# Patient Record
Sex: Male | Born: 1950
Health system: Southern US, Community
[De-identification: ages and names within clinical notes are randomized; demographics above are authoritative.]

## PROBLEM LIST (undated history)

## (undated) DIAGNOSIS — I10 Essential (primary) hypertension: Secondary | ICD-10-CM

## (undated) DIAGNOSIS — I4821 Permanent atrial fibrillation: Secondary | ICD-10-CM

## (undated) DIAGNOSIS — E785 Hyperlipidemia, unspecified: Secondary | ICD-10-CM

## (undated) DIAGNOSIS — T7840XA Allergy, unspecified, initial encounter: Secondary | ICD-10-CM

## (undated) DIAGNOSIS — E119 Type 2 diabetes mellitus without complications: Secondary | ICD-10-CM

## (undated) DIAGNOSIS — K4090 Unilateral inguinal hernia, without obstruction or gangrene, not specified as recurrent: Secondary | ICD-10-CM

## (undated) DIAGNOSIS — I2510 Atherosclerotic heart disease of native coronary artery without angina pectoris: Secondary | ICD-10-CM

## (undated) DIAGNOSIS — R51 Headache: Secondary | ICD-10-CM

## (undated) DIAGNOSIS — I480 Paroxysmal atrial fibrillation: Secondary | ICD-10-CM

## (undated) DIAGNOSIS — I5189 Other ill-defined heart diseases: Secondary | ICD-10-CM

## (undated) HISTORY — DX: Other ill-defined heart diseases: I51.89

## (undated) HISTORY — DX: Hyperlipidemia, unspecified: E78.5

## (undated) HISTORY — DX: Allergy, unspecified, initial encounter: T78.40XA

## (undated) HISTORY — DX: Type 2 diabetes mellitus without complications: E11.9

## (undated) HISTORY — DX: Paroxysmal atrial fibrillation: I48.0

## (undated) HISTORY — DX: Essential (primary) hypertension: I10

## (undated) HISTORY — DX: Permanent atrial fibrillation: I48.21

## (undated) HISTORY — PX: INGUINAL HERNIA REPAIR: SUR1180

---

## 2000-09-29 ENCOUNTER — Encounter: Payer: Self-pay | Admitting: Family Medicine

## 2000-09-29 LAB — CONVERTED CEMR LAB: Hgb A1c MFr Bld: 5.6 %

## 2001-09-28 ENCOUNTER — Encounter: Payer: Self-pay | Admitting: Family Medicine

## 2001-09-28 LAB — CONVERTED CEMR LAB
Hgb A1c MFr Bld: 5.4 %
PSA: 1.1 ng/mL

## 2002-12-24 ENCOUNTER — Encounter: Payer: Self-pay | Admitting: Family Medicine

## 2002-12-24 LAB — CONVERTED CEMR LAB
Hgb A1c MFr Bld: 5.5 %
PSA: 0.9 ng/mL

## 2004-03-05 ENCOUNTER — Encounter: Payer: Self-pay | Admitting: Family Medicine

## 2004-03-05 LAB — CONVERTED CEMR LAB
Hgb A1c MFr Bld: 5.8 %
PSA: 1.4 ng/mL

## 2004-08-13 ENCOUNTER — Ambulatory Visit: Payer: Self-pay | Admitting: Family Medicine

## 2004-08-13 LAB — CONVERTED CEMR LAB: Hgb A1c MFr Bld: 5.5 %

## 2004-08-17 ENCOUNTER — Ambulatory Visit: Payer: Self-pay | Admitting: Family Medicine

## 2004-12-14 ENCOUNTER — Ambulatory Visit: Payer: Self-pay | Admitting: Family Medicine

## 2005-02-12 ENCOUNTER — Ambulatory Visit: Payer: Self-pay | Admitting: Family Medicine

## 2005-02-12 LAB — CONVERTED CEMR LAB
Hgb A1c MFr Bld: 5.8 %
PSA: 1.21 ng/mL

## 2005-02-17 ENCOUNTER — Ambulatory Visit: Payer: Self-pay | Admitting: Family Medicine

## 2005-04-07 ENCOUNTER — Ambulatory Visit: Payer: Self-pay | Admitting: Family Medicine

## 2005-08-17 ENCOUNTER — Ambulatory Visit: Payer: Self-pay | Admitting: Family Medicine

## 2006-03-18 ENCOUNTER — Ambulatory Visit: Payer: Self-pay | Admitting: Family Medicine

## 2006-03-18 LAB — CONVERTED CEMR LAB: PSA: 1.22 ng/mL

## 2006-05-02 ENCOUNTER — Ambulatory Visit: Payer: Self-pay | Admitting: Family Medicine

## 2006-05-02 LAB — CONVERTED CEMR LAB: Hgb A1c MFr Bld: 6.1 %

## 2006-05-13 ENCOUNTER — Ambulatory Visit: Payer: Self-pay | Admitting: Family Medicine

## 2006-06-21 ENCOUNTER — Ambulatory Visit: Payer: Self-pay | Admitting: Family Medicine

## 2006-07-14 ENCOUNTER — Ambulatory Visit: Payer: Self-pay | Admitting: Family Medicine

## 2006-09-19 ENCOUNTER — Ambulatory Visit: Payer: Self-pay | Admitting: Family Medicine

## 2006-09-19 ENCOUNTER — Encounter: Admission: RE | Admit: 2006-09-19 | Discharge: 2006-09-19 | Payer: Self-pay | Admitting: Family Medicine

## 2006-09-27 ENCOUNTER — Encounter: Payer: Self-pay | Admitting: Family Medicine

## 2006-09-27 DIAGNOSIS — E785 Hyperlipidemia, unspecified: Secondary | ICD-10-CM

## 2006-09-27 DIAGNOSIS — I10 Essential (primary) hypertension: Secondary | ICD-10-CM

## 2006-09-27 DIAGNOSIS — N183 Chronic kidney disease, stage 3 unspecified: Secondary | ICD-10-CM | POA: Insufficient documentation

## 2006-09-27 DIAGNOSIS — E1169 Type 2 diabetes mellitus with other specified complication: Secondary | ICD-10-CM | POA: Insufficient documentation

## 2006-09-27 DIAGNOSIS — J309 Allergic rhinitis, unspecified: Secondary | ICD-10-CM | POA: Insufficient documentation

## 2006-09-27 DIAGNOSIS — I129 Hypertensive chronic kidney disease with stage 1 through stage 4 chronic kidney disease, or unspecified chronic kidney disease: Secondary | ICD-10-CM | POA: Insufficient documentation

## 2006-10-13 ENCOUNTER — Ambulatory Visit: Payer: Self-pay | Admitting: Family Medicine

## 2006-12-05 ENCOUNTER — Ambulatory Visit: Payer: Self-pay | Admitting: Family Medicine

## 2007-03-06 ENCOUNTER — Ambulatory Visit: Payer: Self-pay | Admitting: Family Medicine

## 2007-04-10 ENCOUNTER — Ambulatory Visit: Payer: Self-pay | Admitting: Family Medicine

## 2007-04-13 ENCOUNTER — Ambulatory Visit: Payer: Self-pay | Admitting: Family Medicine

## 2007-10-25 ENCOUNTER — Ambulatory Visit: Payer: Self-pay | Admitting: Family Medicine

## 2008-05-23 ENCOUNTER — Encounter: Admission: RE | Admit: 2008-05-23 | Discharge: 2008-05-23 | Payer: Self-pay | Admitting: Internal Medicine

## 2008-07-15 LAB — HM COLONOSCOPY

## 2009-10-15 ENCOUNTER — Encounter: Admission: RE | Admit: 2009-10-15 | Discharge: 2009-10-15 | Payer: Self-pay | Admitting: Internal Medicine

## 2011-06-15 DIAGNOSIS — I2510 Atherosclerotic heart disease of native coronary artery without angina pectoris: Secondary | ICD-10-CM

## 2011-06-15 HISTORY — DX: Atherosclerotic heart disease of native coronary artery without angina pectoris: I25.10

## 2012-04-26 ENCOUNTER — Encounter (HOSPITAL_COMMUNITY): Payer: Self-pay | Admitting: Emergency Medicine

## 2012-04-26 ENCOUNTER — Emergency Department (HOSPITAL_COMMUNITY): Payer: BC Managed Care – PPO

## 2012-04-26 ENCOUNTER — Inpatient Hospital Stay (HOSPITAL_COMMUNITY)
Admission: EM | Admit: 2012-04-26 | Discharge: 2012-05-04 | DRG: 546 | Disposition: A | Discharge status: Home / Self Care | Payer: BC Managed Care – PPO | Attending: Cardiothoracic Surgery | Admitting: Cardiothoracic Surgery

## 2012-04-26 DIAGNOSIS — I2510 Atherosclerotic heart disease of native coronary artery without angina pectoris: Principal | ICD-10-CM | POA: Diagnosis present

## 2012-04-26 DIAGNOSIS — I5031 Acute diastolic (congestive) heart failure: Secondary | ICD-10-CM | POA: Diagnosis not present

## 2012-04-26 DIAGNOSIS — E119 Type 2 diabetes mellitus without complications: Secondary | ICD-10-CM | POA: Diagnosis present

## 2012-04-26 DIAGNOSIS — I4891 Unspecified atrial fibrillation: Secondary | ICD-10-CM | POA: Diagnosis not present

## 2012-04-26 DIAGNOSIS — R079 Chest pain, unspecified: Secondary | ICD-10-CM

## 2012-04-26 DIAGNOSIS — I509 Heart failure, unspecified: Secondary | ICD-10-CM | POA: Diagnosis not present

## 2012-04-26 DIAGNOSIS — K5900 Constipation, unspecified: Secondary | ICD-10-CM | POA: Diagnosis not present

## 2012-04-26 DIAGNOSIS — I480 Paroxysmal atrial fibrillation: Secondary | ICD-10-CM

## 2012-04-26 DIAGNOSIS — Z951 Presence of aortocoronary bypass graft: Secondary | ICD-10-CM

## 2012-04-26 DIAGNOSIS — I10 Essential (primary) hypertension: Secondary | ICD-10-CM | POA: Diagnosis present

## 2012-04-26 DIAGNOSIS — D62 Acute posthemorrhagic anemia: Secondary | ICD-10-CM | POA: Diagnosis not present

## 2012-04-26 DIAGNOSIS — I200 Unstable angina: Secondary | ICD-10-CM | POA: Diagnosis present

## 2012-04-26 DIAGNOSIS — I252 Old myocardial infarction: Secondary | ICD-10-CM

## 2012-04-26 DIAGNOSIS — T63441A Toxic effect of venom of bees, accidental (unintentional), initial encounter: Secondary | ICD-10-CM

## 2012-04-26 DIAGNOSIS — Z8249 Family history of ischemic heart disease and other diseases of the circulatory system: Secondary | ICD-10-CM

## 2012-04-26 HISTORY — DX: Headache: R51

## 2012-04-26 HISTORY — DX: Atherosclerotic heart disease of native coronary artery without angina pectoris: I25.10

## 2012-04-26 LAB — CBC WITH DIFFERENTIAL/PLATELET
Basophils Absolute: 0 10*3/uL (ref 0.0–0.1)
Basophils Relative: 0 % (ref 0–1)
Eosinophils Absolute: 0.3 10*3/uL (ref 0.0–0.7)
Eosinophils Relative: 4 % (ref 0–5)
HCT: 42.8 % (ref 39.0–52.0)
Hemoglobin: 15.3 g/dL (ref 13.0–17.0)
Lymphocytes Relative: 38 % (ref 12–46)
Lymphs Abs: 3 10*3/uL (ref 0.7–4.0)
MCH: 31.4 pg (ref 26.0–34.0)
MCHC: 35.7 g/dL (ref 30.0–36.0)
MCV: 87.7 fL (ref 78.0–100.0)
Monocytes Absolute: 0.6 10*3/uL (ref 0.1–1.0)
Monocytes Relative: 7 % (ref 3–12)
Neutro Abs: 3.9 10*3/uL (ref 1.7–7.7)
Neutrophils Relative %: 50 % (ref 43–77)
Platelets: 195 10*3/uL (ref 150–400)
RBC: 4.88 MIL/uL (ref 4.22–5.81)
RDW: 12.6 % (ref 11.5–15.5)
WBC: 7.7 10*3/uL (ref 4.0–10.5)

## 2012-04-26 LAB — POCT I-STAT TROPONIN I: Troponin i, poc: 0.05 ng/mL (ref 0.00–0.08)

## 2012-04-26 NOTE — ED Notes (Signed)
PT. REPORTS CHEST PAIN / HEAVINESS , SHOULDER PAIN , HEADACHE FOR SEVERAL DAYS, STATES STUNG BY YELLOW JACKET 2 WEEKS AGO . RESPIRATIONS UNLABORED.

## 2012-04-27 ENCOUNTER — Emergency Department (HOSPITAL_COMMUNITY): Payer: BC Managed Care – PPO

## 2012-04-27 ENCOUNTER — Encounter (HOSPITAL_COMMUNITY)
Admission: EM | Disposition: A | Discharge status: Home / Self Care | Payer: Self-pay | Source: Home / Self Care | Attending: Cardiothoracic Surgery

## 2012-04-27 ENCOUNTER — Other Ambulatory Visit: Payer: Self-pay | Admitting: *Deleted

## 2012-04-27 ENCOUNTER — Encounter (HOSPITAL_COMMUNITY): Payer: Self-pay

## 2012-04-27 ENCOUNTER — Inpatient Hospital Stay (HOSPITAL_COMMUNITY)
Admit: 2012-04-27 | Discharge: 2012-04-27 | Disposition: A | Discharge status: Home / Self Care | Payer: BC Managed Care – PPO | Attending: Cardiothoracic Surgery | Admitting: Cardiothoracic Surgery

## 2012-04-27 ENCOUNTER — Observation Stay (HOSPITAL_COMMUNITY): Payer: BC Managed Care – PPO

## 2012-04-27 DIAGNOSIS — I2510 Atherosclerotic heart disease of native coronary artery without angina pectoris: Secondary | ICD-10-CM

## 2012-04-27 DIAGNOSIS — R079 Chest pain, unspecified: Secondary | ICD-10-CM

## 2012-04-27 DIAGNOSIS — Z01810 Encounter for preprocedural cardiovascular examination: Secondary | ICD-10-CM

## 2012-04-27 HISTORY — PX: LEFT HEART CATHETERIZATION WITH CORONARY ANGIOGRAM: SHX5451

## 2012-04-27 LAB — CBC
HCT: 41.9 % (ref 39.0–52.0)
Hemoglobin: 14.7 g/dL (ref 13.0–17.0)
MCH: 30.5 pg (ref 26.0–34.0)
MCHC: 35.1 g/dL (ref 30.0–36.0)
MCV: 86.9 fL (ref 78.0–100.0)
Platelets: 203 10*3/uL (ref 150–400)
RBC: 4.82 MIL/uL (ref 4.22–5.81)
RDW: 12.7 % (ref 11.5–15.5)
WBC: 18.8 10*3/uL — ABNORMAL HIGH (ref 4.0–10.5)

## 2012-04-27 LAB — URINALYSIS, ROUTINE W REFLEX MICROSCOPIC
Bilirubin Urine: NEGATIVE
Glucose, UA: NEGATIVE mg/dL
Hgb urine dipstick: NEGATIVE
Ketones, ur: NEGATIVE mg/dL
Leukocytes, UA: NEGATIVE
Nitrite: NEGATIVE
Protein, ur: NEGATIVE mg/dL
Specific Gravity, Urine: 1.037 — ABNORMAL HIGH (ref 1.005–1.030)
Urobilinogen, UA: 1 mg/dL (ref 0.0–1.0)
pH: 6.5 (ref 5.0–8.0)

## 2012-04-27 LAB — COMPREHENSIVE METABOLIC PANEL
ALT: 26 U/L (ref 0–53)
AST: 35 U/L (ref 0–37)
Albumin: 4 g/dL (ref 3.5–5.2)
Alkaline Phosphatase: 78 U/L (ref 39–117)
BUN: 18 mg/dL (ref 6–23)
CO2: 31 mEq/L (ref 19–32)
Calcium: 9.6 mg/dL (ref 8.4–10.5)
Chloride: 98 mEq/L (ref 96–112)
Creatinine, Ser: 1.25 mg/dL (ref 0.50–1.35)
GFR calc Af Amer: 71 mL/min — ABNORMAL LOW (ref >90)
GFR calc non Af Amer: 61 mL/min — ABNORMAL LOW (ref >90)
Glucose, Bld: 184 mg/dL — ABNORMAL HIGH (ref 70–99)
Potassium: 3.9 mEq/L (ref 3.5–5.1)
Sodium: 136 mEq/L (ref 135–145)
Total Bilirubin: 0.2 mg/dL — ABNORMAL LOW (ref 0.3–1.2)
Total Protein: 7.2 g/dL (ref 6.0–8.3)

## 2012-04-27 LAB — SURGICAL PCR SCREEN
MRSA, PCR: NEGATIVE
Staphylococcus aureus: NEGATIVE

## 2012-04-27 LAB — TROPONIN I: Troponin I: 0.44 ng/mL (ref <0.30)

## 2012-04-27 LAB — POCT I-STAT TROPONIN I
Troponin i, poc: 0.04 ng/mL (ref 0.00–0.08)
Troponin i, poc: 0.05 ng/mL (ref 0.00–0.08)

## 2012-04-27 LAB — MRSA PCR SCREENING: MRSA by PCR: NEGATIVE

## 2012-04-27 LAB — GLUCOSE, CAPILLARY: Glucose-Capillary: 121 mg/dL — ABNORMAL HIGH (ref 70–99)

## 2012-04-27 LAB — CREATININE, SERUM
Creatinine, Ser: 1.02 mg/dL (ref 0.50–1.35)
GFR calc Af Amer: >90 mL/min (ref >90)
GFR calc non Af Amer: 78 mL/min — ABNORMAL LOW (ref >90)

## 2012-04-27 LAB — ABO/RH: ABO/RH(D): A POS

## 2012-04-27 LAB — PREPARE RBC (CROSSMATCH)

## 2012-04-27 SURGERY — LEFT HEART CATH
Anesthesia: Moderate Sedation

## 2012-04-27 SURGERY — LEFT HEART CATHETERIZATION WITH CORONARY ANGIOGRAM
Anesthesia: LOCAL

## 2012-04-27 MED ORDER — SODIUM CHLORIDE 0.9 % IV BOLUS (SEPSIS)
1000.0000 mL | Freq: Once | INTRAVENOUS | Status: AC
  Administered 2012-04-27: 1000 mL via INTRAVENOUS

## 2012-04-27 MED ORDER — METHOCARBAMOL 100 MG/ML IJ SOLN: 1000.0000 mg | Freq: Once | INTRAMUSCULAR | Status: DC

## 2012-04-27 MED ORDER — HEPARIN SODIUM (PORCINE) 1000 UNIT/ML IJ SOLN
INTRAMUSCULAR | Status: AC
  Filled 2012-04-27: qty 1

## 2012-04-27 MED ORDER — CHLORHEXIDINE GLUCONATE 4 % EX LIQD
60.0000 mL | Freq: Once | CUTANEOUS | Status: AC
  Administered 2012-04-28: 4 via TOPICAL
  Filled 2012-04-27: qty 60

## 2012-04-27 MED ORDER — INSULIN ASPART 100 UNIT/ML ~~LOC~~ SOLN: 0.0000 [IU] | Freq: Every day | SUBCUTANEOUS | Status: DC

## 2012-04-27 MED ORDER — METHYLPREDNISOLONE SODIUM SUCC 125 MG IJ SOLR
125.0000 mg | Freq: Once | INTRAMUSCULAR | Status: AC
  Administered 2012-04-27: 125 mg via INTRAVENOUS
  Filled 2012-04-27: qty 2

## 2012-04-27 MED ORDER — LEVOFLOXACIN IN D5W 500 MG/100ML IV SOLN
500.0000 mg | INTRAVENOUS | Status: AC
  Administered 2012-04-28: .5 g via INTRAVENOUS
  Filled 2012-04-27: qty 100

## 2012-04-27 MED ORDER — IOHEXOL 350 MG/ML SOLN: 80.0000 mL | Freq: Once | INTRAVENOUS | Status: AC | PRN

## 2012-04-27 MED ORDER — ATORVASTATIN CALCIUM 80 MG PO TABS
80.0000 mg | ORAL_TABLET | Freq: Every day | ORAL | Status: DC
  Administered 2012-04-27: 80 mg via ORAL
  Filled 2012-04-27 (×2): qty 1

## 2012-04-27 MED ORDER — METOPROLOL TARTRATE 12.5 MG HALF TABLET
12.5000 mg | ORAL_TABLET | Freq: Once | ORAL | Status: AC
  Administered 2012-04-28: 12.5 mg via ORAL
  Filled 2012-04-27: qty 1

## 2012-04-27 MED ORDER — ALBUTEROL SULFATE (5 MG/ML) 0.5% IN NEBU
2.5000 mg | INHALATION_SOLUTION | Freq: Once | RESPIRATORY_TRACT | Status: AC
  Administered 2012-04-27: 2.5 mg via RESPIRATORY_TRACT

## 2012-04-27 MED ORDER — MIDAZOLAM HCL 2 MG/2ML IJ SOLN
INTRAMUSCULAR | Status: AC
  Filled 2012-04-27: qty 2

## 2012-04-27 MED ORDER — METOPROLOL TARTRATE 25 MG PO TABS
50.0000 mg | ORAL_TABLET | Freq: Once | ORAL | Status: AC
  Administered 2012-04-27: 50 mg via ORAL
  Filled 2012-04-27: qty 2

## 2012-04-27 MED ORDER — FENTANYL CITRATE 0.05 MG/ML IJ SOLN
INTRAMUSCULAR | Status: AC
  Filled 2012-04-27: qty 2

## 2012-04-27 MED ORDER — ONDANSETRON HCL 4 MG/2ML IJ SOLN: 4.0000 mg | Freq: Four times a day (QID) | INTRAMUSCULAR | Status: DC | PRN

## 2012-04-27 MED ORDER — METOPROLOL TARTRATE 1 MG/ML IV SOLN
2.5000 mg | Freq: Four times a day (QID) | INTRAVENOUS | Status: DC
  Administered 2012-04-27 – 2012-04-28 (×2): 2.5 mg via INTRAVENOUS
  Filled 2012-04-27 (×6): qty 5

## 2012-04-27 MED ORDER — SODIUM CHLORIDE 0.9 % IV SOLN
INTRAVENOUS | Status: AC
  Administered 2012-04-28: 12:00:00 via INTRAVENOUS
  Administered 2012-04-28: 69.8 mL/h via INTRAVENOUS
  Filled 2012-04-27: qty 40

## 2012-04-27 MED ORDER — BISACODYL 5 MG PO TBEC: 5.0000 mg | DELAYED_RELEASE_TABLET | Freq: Once | ORAL | Status: DC

## 2012-04-27 MED ORDER — LIDOCAINE HCL (PF) 1 % IJ SOLN
INTRAMUSCULAR | Status: AC
  Filled 2012-04-27: qty 30

## 2012-04-27 MED ORDER — PNEUMOCOCCAL VAC POLYVALENT 25 MCG/0.5ML IJ INJ
0.5000 mL | INJECTION | INTRAMUSCULAR | Status: DC
  Filled 2012-04-27: qty 0.5

## 2012-04-27 MED ORDER — ISOSORBIDE MONONITRATE ER 30 MG PO TB24
30.0000 mg | ORAL_TABLET | Freq: Every day | ORAL | Status: DC
  Administered 2012-04-27: 30 mg via ORAL
  Filled 2012-04-27 (×2): qty 1

## 2012-04-27 MED ORDER — INFLUENZA VIRUS VACC SPLIT PF IM SUSP
0.5000 mL | INTRAMUSCULAR | Status: DC
  Filled 2012-04-27: qty 0.5

## 2012-04-27 MED ORDER — SODIUM CHLORIDE 0.9 % IV SOLN
INTRAVENOUS | Status: DC
  Administered 2012-04-27: 75 mL/h via INTRAVENOUS

## 2012-04-27 MED ORDER — POTASSIUM CHLORIDE 2 MEQ/ML IV SOLN
80.0000 meq | INTRAVENOUS | Status: DC
  Filled 2012-04-27: qty 40

## 2012-04-27 MED ORDER — METOPROLOL TARTRATE 1 MG/ML IV SOLN
INTRAVENOUS | Status: AC
  Administered 2012-04-27: 12:00:00
  Filled 2012-04-27: qty 5

## 2012-04-27 MED ORDER — NITROGLYCERIN 0.2 MG/ML ON CALL CATH LAB
INTRAVENOUS | Status: AC
  Filled 2012-04-27: qty 1

## 2012-04-27 MED ORDER — MAGNESIUM SULFATE 50 % IJ SOLN
40.0000 meq | INTRAMUSCULAR | Status: DC
  Filled 2012-04-27: qty 10

## 2012-04-27 MED ORDER — INSULIN ASPART 100 UNIT/ML ~~LOC~~ SOLN: 0.0000 [IU] | Freq: Three times a day (TID) | SUBCUTANEOUS | Status: DC

## 2012-04-27 MED ORDER — ASPIRIN 81 MG PO CHEW: 81.0000 mg | CHEWABLE_TABLET | Freq: Every day | ORAL | Status: DC

## 2012-04-27 MED ORDER — HEPARIN SODIUM (PORCINE) 5000 UNIT/ML IJ SOLN
5000.0000 [IU] | Freq: Three times a day (TID) | INTRAMUSCULAR | Status: DC
  Administered 2012-04-27 – 2012-04-28 (×2): 5000 [IU] via SUBCUTANEOUS
  Filled 2012-04-27 (×6): qty 1

## 2012-04-27 MED ORDER — ASPIRIN 81 MG PO CHEW
324.0000 mg | CHEWABLE_TABLET | Freq: Once | ORAL | Status: AC
  Administered 2012-04-27: 243 mg via ORAL
  Filled 2012-04-27: qty 4

## 2012-04-27 MED ORDER — PLASMA-LYTE 148 IV SOLN
INTRAVENOUS | Status: AC
  Administered 2012-04-28: 09:00:00
  Filled 2012-04-27: qty 2.5

## 2012-04-27 MED ORDER — ACETAMINOPHEN 325 MG PO TABS: 650.0000 mg | ORAL_TABLET | ORAL | Status: DC | PRN

## 2012-04-27 MED ORDER — DEXTROSE 5 % IV SOLN
1000.0000 mg | Freq: Once | INTRAVENOUS | Status: AC
  Administered 2012-04-27: 1000 mg via INTRAVENOUS
  Filled 2012-04-27: qty 10

## 2012-04-27 MED ORDER — VANCOMYCIN HCL 1000 MG IV SOLR
1250.0000 mg | INTRAVENOUS | Status: AC
  Administered 2012-04-28: 1250 mg via INTRAVENOUS
  Filled 2012-04-27: qty 1250

## 2012-04-27 MED ORDER — SODIUM CHLORIDE 0.9 % IV SOLN
INTRAVENOUS | Status: AC
  Administered 2012-04-28: 3.4 [IU]/h via INTRAVENOUS
  Filled 2012-04-27: qty 1

## 2012-04-27 MED ORDER — DIAZEPAM 5 MG PO TABS
5.0000 mg | ORAL_TABLET | ORAL | Status: DC | PRN
  Administered 2012-04-28: 5 mg via ORAL
  Filled 2012-04-27: qty 1

## 2012-04-27 MED ORDER — TEMAZEPAM 15 MG PO CAPS
15.0000 mg | ORAL_CAPSULE | Freq: Once | ORAL | Status: AC | PRN
  Administered 2012-04-28: 15 mg via ORAL
  Filled 2012-04-27 (×2): qty 1

## 2012-04-27 MED ORDER — NITROGLYCERIN IN D5W 200-5 MCG/ML-% IV SOLN
2.0000 ug/min | INTRAVENOUS | Status: DC
  Filled 2012-04-27: qty 250

## 2012-04-27 MED ORDER — HEPARIN (PORCINE) IN NACL 2-0.9 UNIT/ML-% IJ SOLN
INTRAMUSCULAR | Status: AC
  Filled 2012-04-27: qty 500

## 2012-04-27 MED ORDER — VERAPAMIL HCL 2.5 MG/ML IV SOLN
INTRAVENOUS | Status: AC
  Filled 2012-04-27: qty 2

## 2012-04-27 MED ORDER — DOPAMINE-DEXTROSE 3.2-5 MG/ML-% IV SOLN
2.0000 ug/kg/min | INTRAVENOUS | Status: AC
  Administered 2012-04-28: 3 ug/kg/min via INTRAVENOUS
  Filled 2012-04-27: qty 250

## 2012-04-27 MED ORDER — DEXMEDETOMIDINE HCL IN NACL 400 MCG/100ML IV SOLN
0.1000 ug/kg/h | INTRAVENOUS | Status: AC
  Administered 2012-04-28: 0.3 ug/kg/h via INTRAVENOUS
  Filled 2012-04-27: qty 100

## 2012-04-27 MED ORDER — EPINEPHRINE HCL 1 MG/ML IJ SOLN
0.5000 ug/min | INTRAVENOUS | Status: DC
  Filled 2012-04-27: qty 4

## 2012-04-27 MED ORDER — PHENYLEPHRINE HCL 10 MG/ML IJ SOLN
30.0000 ug/min | INTRAVENOUS | Status: DC
  Filled 2012-04-27: qty 2

## 2012-04-27 NOTE — ED Notes (Signed)
Pt c/o of headache, neck pain and upper chest pressure since he ws stung by bees 2 weeks ago. Pt is not allergic and denies sob or swelling. Pain is constant but gets worse at times. pts a&ox4. No distress.

## 2012-04-27 NOTE — H&P (Addendum)
Physician History and Physical    [name] [name] MRN: [id number] DOB/AGE: [birth year] 60 y.o. Admit date: 04/26/2012  Primary Care Physician: Tomi Bamberger Primary Cardiologist: New  HPI: 61 yo with history of HTN presented to ER last night with chest pain.  He was stung by a yellow jacket on the back of his head last week.  Since that time, he has had aching pain in his posterior head and neck but also across his upper chest.  The pain has been on and off, never lasting more than a few hours.  It has not occurred every day but has been frequent over the last 2 wks.  It is not triggered by exertion.  He actually notes it more when he lies down.  He is active at baseline, mowing lawns and doing other yardwork without exertional dyspnea or chest pain.  Last night, he got in bed and developed the posterior neck/upper chest aching again, fairly severely.  His wife made him come to the ER.  In the ER, troponin was within normal limits.  ECG had nonspecific abnormalities.  He was put in the CDU for chest pain evaluation.  He was sent for coronary CT angiogram today but calcium score was very high (1389 Agatston units), so CT was not done and cardiology was consulted.  Currently, he is chest pain-free.    Review of systems complete and found to be negative unless listed above   PMH: 1. HTN  FH:  Father with CABG in his 2s, mother with CABG in her 51s.   Social History  . Marital Status: Married    Spouse Name: N/A    Number of Children: N/A  . Years of Education: N/A   Occupational History  . 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    Social History Main Topics  . Smoking status: Never Smoker   . Smokeless tobacco: Not on file  . Alcohol Use: Yes  . Drug Use: No  . Sexually Active:    Other Topics Concern  . Lives in [place]   Social History Narrative  . No narrative on file    Current Facility-Administered Medications  Medication Dose Route Frequency Provider Last Rate Last  Dose  . [COMPLETED] aspirin chewable tablet 324 mg  324 mg Oral Once April K Palumbo-Rasch, MD   243 mg at 04/27/12 0055  . iohexol (OMNIPAQUE) 350 MG/ML injection 80 mL  80 mL Intravenous Once PRN Medication Radiologist, MD      . [address] methocarbamol (ROBAXIN) 1,000 mg in dextrose 5 % 50 mL IVPB  1,000 mg Intravenous Once April K Palumbo-Rasch, MD   1,000 mg at 04/27/12 0056  . [COMPLETED] methylPREDNISolone sodium succinate (SOLU-MEDROL) 125 mg/2 mL injection 125 mg  125 mg Intravenous Once April K Palumbo-Rasch, MD   125 mg at 04/27/12 0055  . [COMPLETED] metoprolol (LOPRESSOR) 1 MG/ML injection           . [COMPLETED] metoprolol tartrate (LOPRESSOR) tablet 50 mg  50 mg Oral Once Renne Crigler, PA   50 mg at 04/27/12 0919  . [COMPLETED] metoprolol tartrate (LOPRESSOR) tablet 50 mg  50 mg Oral Once Trudie Reed, MD   50 mg at 04/27/12 1011  . [COMPLETED] sodium chloride 0.9 % bolus 1,000 mL  1,000 mL Intravenous Once April K Palumbo-Rasch, MD   1,000 mL at 04/27/12 0055  . [COMPLETED] sodium chloride 0.9 % bolus 1,000 mL  1,000 mL Intravenous Once April Smitty Cords, MD   1,000 mL at  04/27/12 0056  . [DISCONTINUED] methocarbamol (ROBAXIN) injection 1,000 mg  1,000 mg Intravenous Once April Smitty Cords, MD       Current Outpatient Prescriptions  Medication Sig Dispense Refill  . aspirin EC 81 MG tablet Take 81 mg by mouth daily.      . fish oil-omega-3 fatty acids 1000 MG capsule Take 1 g by mouth daily.      . metoprolol succinate (TOPROL-XL) 50 MG 24 hr tablet Take 50 mg by mouth daily. Take with or immediately following a meal.        Physical Exam: Blood pressure 127/75, pulse 54, temperature 98.2 F (36.8 C), temperature source Oral, resp. rate 13, height 6\' 4"  (1.93 m), weight 235 lb (106.595 kg), SpO2 9.00%.  General: NAD Neck: No JVD, no thyromegaly or thyroid nodule.  Lungs: Clear to auscultation bilaterally with normal respiratory effort. CV: Nondisplaced PMI.   Heart regular S1/S2, no S3/S4, no murmur.  No peripheral edema.  No carotid bruit.  Normal pedal pulses.  Abdomen: Soft, nontender, no hepatosplenomegaly, no distention.  Skin: Intact without lesions or rashes.  Neurologic: Alert and oriented x 3.  Psych: Normal affect. Extremities: No clubbing or cyanosis.  HEENT: Normal.   Labs:   Lab Results  Component Value Date   WBC 7.7 04/26/2012   HGB 15.3 04/26/2012   HCT 42.8 04/26/2012   MCV 87.7 04/26/2012   PLT 195 04/26/2012    Lab 04/26/12 2335  NA 136  K 3.9  CL 98  CO2 31  BUN 18  CREATININE 1.25  CALCIUM 9.6  PROT 7.2  BILITOT 0.2*  ALKPHOS 78  ALT 26  AST 35  GLUCOSE 184*      Radiology: Coronary calcium score: 1389 Agatston units, 98th percentile.  Small nodules noted as well, recommend repeat CT in 1 year.   EKG: NSR, LAFB, LVH with repolarization changes, poor anterior R wave progression  ASSESSMENT AND PLAN:   61 yo with history of HTN and family history of CAD presented with prolonged, on and off atypical upper chest and neck aching.  He does not smoke.  Coronary calcium score was very high, 1389 Agatston units.  A coronary artery calcium score > 400 Agatston units suggests a significant risk of obstructive CAD.  Given his rest symptoms on and off over the last couple of weeks, I cannot rule out unstable angina as the etiology of his symptoms.  I will plan LHC today as he is NPO already.  I discussed risks/benefits of cath with him, including 1:1000 risk of severe complication.   Signed: Marca Ancona 04/27/2012, 2:01 PM

## 2012-04-27 NOTE — ED Provider Notes (Signed)
8:56 AM Handoff from Dr. Nicanor Alcon.   Pt to CDU on CPP. Possible relationship with recent bee sting? Risk factors: HTN.   Exam:  Gen NAD; Heart RRR, nml S1,S2, no m/r/g; Lungs CTAB; Abd soft, NT, no rebound or guarding; Ext 2+ pedal pulses bilaterally, no edema.  11:49 AM Spoke with Dr. Llana Aliment who states patient has Ca score = 1389. Scan was not completed due to high score. Will need cardiology consult.   Pt informed and aware.   Pulmonary nodule also noted that will need follow-up in 12 months. Awaiting cardiology consult.   11:54 AM Cardiology to see.   1:57 PM Dr. Shirlee Latch has seen. Will admit for cath.   Renne Crigler, [place] 04/27/12 1357

## 2012-04-27 NOTE — ED Notes (Signed)
Patient back in room. Placed on cardiac monitoring.

## 2012-04-27 NOTE — ED Notes (Addendum)
Spoke with Dr. Llana Aliment about patients CT Heart Morp test advised me to call back 40 minutes after patient has received metoprolol/ explained to patient the protocol for chest pain/ advised pt he must stay npo till after test results are complete./ Pt BMI is 30.8

## 2012-04-27 NOTE — ED Notes (Signed)
Pt is back in his room

## 2012-04-27 NOTE — ED Notes (Signed)
Metoprolol not given due to pt HR already being in 50's. MD aware

## 2012-04-27 NOTE — ED Notes (Signed)
Pt to xray

## 2012-04-27 NOTE — Progress Notes (Signed)
CRITICAL VALUE ALERT  Critical value received: troponin 0.44  Date of notification:  04/27/12  Time of notification:  1730  Critical value read back:yes  Nurse who received alert:  Daily Doe E. Suzie Portela  MD notified (1st page):   Time of first page:    MD notified (2nd page):  Time of second page:  Responding MD:    Time MD responded:

## 2012-04-27 NOTE — Consult Note (Signed)
[address]            [place] [address]          [phone]       Mishael Haran [facility] Medical Record #[id number] Date of Birth: 02-Mar-1951  Referring: No ref. provider found Primary Care: Tomi Bamberger, NP  Chief Complaint:    Chief Complaint  Patient presents with  . Chest Pain    History of Present Illness:     61 year old hypertensive diabetic male with positive family history of CAD presented to the emergency room with unstable angina and negative enzymes. Subsequent cardiac catheterization performed today shows severe three-vessel coronary disease with preserved LV function. 2-D echocardiogram is pending. He is been stable in the CCU without further chest pain. His chest x-ray is clear and he has had no significant arrhythmias. Because of his symptoms and severe multivessel CAD surgical coronary revascularization is recommended   Current Activity/ Functional Status: Patient very active works full time   Past Medical History  Diagnosis Date  . Hypertension   . Coronary artery disease   . Anginal pain   . Diabetes mellitus without complication   . Headache     Past Surgical History  Procedure Date  . Cardiac catheterization   . Hernia repair     right inguinal hernia repair    History  Smoking status  . Never Smoker   Smokeless tobacco  . Not on file    History  Alcohol Use  . Yes    Comment: maybe 1 beer q 2 weeks if that    History   Social History  . Marital Status: Married    Spouse Name: N/A    Number of Children: N/A  . Years of Education: N/A   Occupational History  . Not on file.   Social History Main Topics  . Smoking status: Never Smoker   . Smokeless tobacco: Not on file  . Alcohol Use: Yes     Comment: maybe 1 beer q 2 weeks if that  . Drug Use: No  . Sexually Active: Yes   Other Topics Concern  . Not on file   Social History Narrative  . No narrative on file    Allergies    Allergen Reactions  . Penicillins Other (See Comments)    Unknown-childhood reaction   rash to penicillin  Current Facility-Administered Medications  Medication Dose Route Frequency Provider Last Rate Last Dose  . 0.9 %  sodium chloride infusion   Intravenous Continuous [name], MD 75 mL/hr at 04/27/12 1600 75 mL/hr at 04/27/12 1600  . acetaminophen (TYLENOL) tablet 650 mg  650 mg Oral Q4H PRN [name], MD      . [COMPLETED] albuterol (PROVENTIL) (5 MG/ML) 0.5% nebulizer solution 2.5 mg  2.5 mg Nebulization Once [name], MD   2.5 mg at 04/27/12 1640  . aminocaproic acid (AMICAR) 10 g in sodium chloride 0.9 % 100 mL infusion   Intravenous To OR [name], MD      . [address] aspirin chewable tablet 324 mg  324 mg Oral Once April K Palumbo-Rasch, MD   243 mg at 04/27/12 0055  . aspirin chewable tablet 81 mg  81 mg Oral Daily [name], MD      . atorvastatin (LIPITOR) tablet 80 mg  80 mg Oral q1800 Eliot Ford  Shirlee Latch, MD   80 mg at 04/27/12 1810  . bisacodyl (DULCOLAX) EC tablet 5 mg  5 mg Oral Once [name], MD      . chlorhexidine (HIBICLENS) 4 % liquid 4 application  60 mL Topical Once [name], MD      . dexmedetomidine (PRECEDEX) 400 mcg / 100 mL infusion  0.1-0.7 mcg/kg/hr Intravenous To OR [name], MD      . diazepam (VALIUM) tablet 5-10 mg  5-10 mg Oral Q4H PRN [name], MD      . DOPamine (INTROPIN) 800 mg in dextrose 5 % 250 mL infusion  2-20 mcg/kg/min Intravenous To OR [name], MD      . EPINEPHrine (ADRENALIN) 4,000 mcg in dextrose 5 % 250 mL infusion  0.5-20 mcg/min Intravenous To OR [name], MD      . [COMPLETED] fentaNYL (SUBLIMAZE) 0.05 MG/ML injection           . [COMPLETED] heparin 1000 UNIT/ML injection           . [COMPLETED] heparin 1000 UNIT/ML injection           . heparin 2,500 Units, papaverine 30 mg in electrolyte-148 (PLASMALYTE-148) 500 mL irrigation   Irrigation To OR [name],  MD      . [COMPLETED] heparin 2-0.9 UNIT/ML-% infusion           . heparin injection 5,000 Units  5,000 Units Subcutaneous Q8H [name], MD   5,000 Units at 04/27/12 1715  . influenza  inactive virus vaccine (FLUZONE/FLUARIX) injection 0.5 mL  0.5 mL Intramuscular Tomorrow-1000 [name], MD      . insulin aspart (novoLOG) injection 0-15 Units  0-15 Units Subcutaneous TID WC [name], MD      . insulin aspart (novoLOG) injection 0-5 Units  0-5 Units Subcutaneous QHS [name], MD      . insulin regular (NOVOLIN R,HUMULIN R) 1 Units/mL in sodium chloride 0.9 % 100 mL infusion   Intravenous To OR [name], MD      . iohexol (OMNIPAQUE) 350 MG/ML injection 80 mL  80 mL Intravenous Once PRN Medication Radiologist, MD      . isosorbide mononitrate (IMDUR) 24 hr tablet 30 mg  30 mg Oral Daily [name], MD   30 mg at 04/27/12 1810  . levofloxacin (LEVAQUIN) IVPB 500 mg  500 mg Intravenous To OR [name], MD      . [COMPLETED] lidocaine (XYLOCAINE) 1 % injection           . magnesium sulfate (IV Push/IM) injection 40 mEq  40 mEq Other To OR [name], MD      . [address] methocarbamol (ROBAXIN) 1,000 mg in dextrose 5 % 50 mL IVPB  1,000 mg Intravenous Once April K Palumbo-Rasch, MD   1,000 mg at 04/27/12 0056  . [COMPLETED] methylPREDNISolone sodium succinate (SOLU-MEDROL) 125 mg/2 mL injection 125 mg  125 mg Intravenous Once April K Palumbo-Rasch, MD   125 mg at 04/27/12 0055  . [COMPLETED] metoprolol (LOPRESSOR) 1 MG/ML injection           . metoprolol (LOPRESSOR) injection 2.5 mg  2.5 mg Intravenous Q6H [name], MD   2.5 mg at 04/27/12 1812  . metoprolol tartrate (LOPRESSOR) tablet 12.5 mg  12.5 mg Oral Once [name], MD      . [COMPLETED] metoprolol tartrate (LOPRESSOR) tablet 50  mg  50 mg Oral Once Renne Crigler, [place]   50 mg at 04/27/12 0919  . [COMPLETED] metoprolol tartrate (LOPRESSOR) tablet 50 mg  50 mg Oral Once Trudie Reed,  MD   50 mg at 04/27/12 1011  . [COMPLETED] midazolam (VERSED) 2 MG/2ML injection           . [COMPLETED] nitroGLYCERIN (NTG ON-CALL) 0.2 mg/mL injection           . nitroGLYCERIN 0.2 mg/mL in dextrose 5 % infusion  2-200 mcg/min Intravenous To OR [name], MD      . ondansetron [facility]) injection 4 mg  4 mg Intravenous Q6H PRN [name], MD      . phenylephrine (NEO-SYNEPHRINE) 20,000 mcg in dextrose 5 % 250 mL infusion  30-200 mcg/min Intravenous To OR [name], MD      . pneumococcal 23 valent vaccine (PNU-IMMUNE) injection 0.5 mL  0.5 mL Intramuscular Tomorrow-1000 [name], MD      . potassium chloride injection 80 mEq  80 mEq Other To OR [name], MD      . [address] sodium chloride 0.9 % bolus 1,000 mL  1,000 mL Intravenous Once April K Palumbo-Rasch, MD   1,000 mL at 04/27/12 0055  . [COMPLETED] sodium chloride 0.9 % bolus 1,000 mL  1,000 mL Intravenous Once April K Palumbo-Rasch, MD   1,000 mL at 04/27/12 0056  . temazepam (RESTORIL) capsule 15 mg  15 mg Oral Once PRN [name], MD      . vancomycin (VANCOCIN) 1,250 mg in sodium chloride 0.9 % 250 mL IVPB  1,250 mg Intravenous To OR [name], MD      . [COMPLETED] verapamil (ISOPTIN) 2.5 MG/ML injection           . [DISCONTINUED] methocarbamol (ROBAXIN) injection 1,000 mg  1,000 mg Intravenous Once April K Palumbo-Rasch, MD        Prescriptions prior to admission  Medication Sig Dispense Refill  . aspirin EC 81 MG tablet Take 81 mg by mouth daily.      . fish oil-omega-3 fatty acids 1000 MG capsule Take 1 g by mouth daily.      . metoprolol succinate (TOPROL-XL) 50 MG 24 hr tablet Take 50 mg by mouth daily. Take with or immediately following a meal.        Family History  Problem Relation Age of Onset  . CAD Mother   . Heart attack Mother   . CAD Father   . Diabetes Father   . Heart attack Father      Review of Systems:     Cardiac Review of Systems: Y or N  Chest Pain [ y    ]  Resting SOB [n   ] Exertional SOB  [n  ]  Orthopnea Milo.Brash  ]   Pedal Edema [ n  ]    Palpitations [n  ] Syncope  [  ]   Presyncope [   ]  General Review of Systems: [Y] = yes [  ]=no Constitional: recent weight change [  ]; anorexia [  ]; fatigue [  ]; nausea [  ]; night sweats [  ]; fever [  ]; or chills [  ];  Dental: poor dentition[  ]; Last Dentist visit:1 yr  Eye : blurred vision [  ]; diplopia [   ]; vision changes [  ];  Amaurosis fugax[  ]; Resp: cough [  ];  wheezing[  ];  hemoptysis[  ]; shortness of breath[  ]; paroxysmal nocturnal dyspnea[  ]; dyspnea on exertion[  ]; or orthopnea[  ];  GI:  gallstones[  ], vomiting[  ];  dysphagia[  ]; melena[  ];  hematochezia [  ]; heartburn[  ];   Hx of  Colonoscopy[  ]; GU: kidney stones [  ]; hematuria[  ];   dysuria [  ];  nocturia[  ];  history of     obstruction [  ];             Skin: rash, swelling[  ];, hair loss[  ];  peripheral edema[  ];  or itching[  ]; Musculosketetal: myalgias[  ];  joint swelling[  ];  joint erythema[  ];  joint pain[  ];  back pain[  ];  Heme/Lymph: bruising[  ];  bleeding[  ];  anemia[  ];  Neuro: TIA[  ];  headaches[  ];  stroke[  ];  vertigo[  ];  seizures[  ];   paresthesias[  ];  difficulty walking[  ];  Psych:depression[  ]; anxiety[  ];  Endocrine: diabetes[y  ];  thyroid dysfunction[  ];  Immunizations: Flu [  ]; Pneumococcal[  ];  Other: Severe varicose veins of right leg, diabetes diet controlled  Physical Exam: BP 126/59  Pulse 64  Temp 98.5 F (36.9 C) (Oral)  Resp 16  Ht 6\' 4"  (1.93 m)  Wt 237 lb 10.5 oz (107.8 kg)  BMI 28.93 kg/m2  SpO2 96% Gen. middle-aged Caucasian male no acute distress and CCU accompanied by wife HEENT normocephalic pupils equal Neck without JVD mass or bruit Lymphatics no palpable nodes in the neck or supraclavicular fossa Thorax  without deformity breath sounds clear and equal Cardiac regular rhythm without murmur or gallop Abdomen soft nontender without pulsatile mass Extremities without edema cyanosis or tenderness, constrictive band over right wrist at radial artery Vascular full arterial pulses in all extremities, severe varicosities of the right leg Neuro no focal motor deficit, right-hand dominant    Diagnostic Studies & Laboratory data:   Coronary arteriograms reviewed and discussed with patient  Recent Radiology Findings:   Dg Chest 2 View  04/26/2012  *RADIOLOGY REPORT*  Clinical Data: Chest pain.  CHEST - 2 VIEW  Comparison: None.  Findings: The Heart and mediastinal contours are within normal limits.  No focal opacities or effusions.  No acute bony abnormality.  IMPRESSION: No active cardiopulmonary disease.   Original Report Authenticated By: Charlett Nose, M.D.    Dg Cervical Spine Complete  04/27/2012  *RADIOLOGY REPORT*  Clinical Data: Pain in posterior neck  CERVICAL SPINE - COMPLETE 4+ VIEW  Comparison: 09/19/2006  Findings: Normal alignment of the cervical spine.  The vertebral body heights are preserved.  Disc space narrowing and ventral spurring is noted.  Most severe at C5-6 and C6-7.  There is no fracture or subluxation identified.  IMPRESSION:  1.  Cervical spondylosis.   Original Report Authenticated By: Signa Kell, M.D.    Ct Cardiac Scoring  04/27/2012  *RADIOLOGY REPORT*  Clinical Data: 61 year old male with history of chest pain.  CT HEART WITHOUT CALCIUM SCORING  Comparison: No priors.  Comment:  The examination was originally planned to be a full cardiac CTA,  however, after initial calcium scoring the extent of calcification in the coronary arteries was such that the CT examination was likely to be limited or nondiagnostic. Accordingly, the examination was terminated and changed to a calcium score.  Findings:  Calcium score: Left main:              9 LAD:                    527 Left  Circumflex:        136 RCA:                    717  Total Coronary Artery Calcium Score:  1,389 Percentile Rank:  98th Arterial Age:           89 years  ([email or web address]  Additional Findings:  A 6 mm subpleural nodule in the medial aspect of the right middle lobe (image 21 of series six).  Within the visualized portions of the thorax there is no acute consolidative airspace disease, pleural effusion or pneumothorax.  Visualized portions of the upper abdomen are unremarkable. There are no aggressive appearing lytic or blastic lesions noted in the visualized portions of the skeleton.  IMPRESSION: 1.  Patient's total coronary artery calcium score is 1,389 which is 98 percentile for patient's of matched age, gender and race/ethnicity.  Based upon this, the patient's calculated arterial age is that of a 61 year old male. 2.  Given the patient's acute symptomatology, further evaluation by Cardiology is recommended at this time.   Assessment for potential risk factor modification, dietary therapy or pharmacologic therapy may be warranted, if clinically indicated. 3.  6 mm subpleural pulmonary nodule in the medial aspect of the right middle lobe. If the patient is at high risk for bronchogenic carcinoma, follow-up chest CT at 6-12 months is recommended.  If the patient is at low risk for bronchogenic carcinoma, follow-up chest CT at 12 months is recommended.  This recommendation follows the consensus statement: Guidelines for Management of Small Pulmonary Nodules Detected on CT Scans: A Statement from the Fleischner Society as published in Radiology 2005; 237:395-400.  Findings were discussed with the CDU mid level on 04/27/2012 at the 11:48 a.m.   Original Report Authenticated By: Trudie Reed, M.D.       Recent Lab Findings: Lab Results  Component Value Date   WBC 18.8* 04/27/2012   HGB 14.7 04/27/2012   HCT 41.9 04/27/2012   PLT 203 04/27/2012   GLUCOSE 184* 04/26/2012     ALT 26 04/26/2012   AST 35 04/26/2012   NA 136 04/26/2012   K 3.9 04/26/2012   CL 98 04/26/2012   CREATININE 1.02 04/27/2012   BUN 18 04/26/2012   CO2 31 04/26/2012   HGBA1C 6.1 05/02/2006      Assessment / Plan:      Unstable angina, severe multivessel coronary disease with preserved LV function Leukocytosis possibly related to previous yellow jacket bee sting We'll proceed with multivessel bypass grafting in a.m. with targets including the LAD, diagonal, OM, PD, PL. Procedure indications risks and expected recovery reviewed with patient. He understands and agrees to proceed with surgery.     @me1 @ 04/27/2012 7:34 PM

## 2012-04-27 NOTE — Progress Notes (Signed)
VASCULAR LAB PRELIMINARY  PRELIMINARY  PRELIMINARY  PRELIMINARY  Pre-op Cardiac Surgery  Carotid Findings:   There is no obvious evidence of hemodynamically significant internal carotid artery stenosis >40%. Vertebral arteries are patent with antegrade flow.   Upper Extremity Right Left  Brachial Pressures Cardiac cath 130  triphasic  Radial Waveforms  biphasic  Ulnar Waveforms  biphasic  Palmar Arch (Allen's Test)  Decrease greater than 50 % with radial compression and remains normal with ulnar compression   Findings:      Lower  Extremity Right Left  Dorsalis Pedis    Anterior Tibial    Posterior Tibial    Ankle/Brachial Indices      Findings:  Palpable pedal pulses X 4.   Gertie Fey, RDCS, RDMS [place], Jasson Siegmann, 04/27/2012, 6:00 PM

## 2012-04-27 NOTE — Progress Notes (Signed)
Patient ID: [name] [name], male   DOB: [birth year], 60 y.o.   MRN: [id number]   Cardiac Catheterization Procedure Note  Name: [name] [name] MRN: [id number] DOB: 05/15/1951  Procedure: Left Heart Cath, Selective Coronary Angiography, LV angiography  Indication: Abnormal coronary artery calcium score, unstable angina.    Procedural Details: The right wrist was prepped, draped, and anesthetized with 1% lidocaine. Using the modified Seldinger technique, a 5 French sheath was introduced into the right radial artery. 3 mg of verapamil was administered through the sheath, weight-based unfractionated heparin was administered intravenously. Standard Judkins catheters were used for selective coronary angiography and left ventriculography. Catheter exchanges were performed over an exchange length guidewire. There were no immediate procedural complications. A TR band was used for radial hemostasis at the completion of the procedure.  The patient was transferred to the post catheterization recovery area for further monitoring.  Procedural Findings: Hemodynamics: AO 111/60 LV 129/3  Coronary angiography: Coronary dominance: right  Left mainstem: Suspect about 60% ostial/proximal stenosis (short vessel).  There was ventricularization of the pressure waveform with engagement.   Left anterior descending (LAD): Small to moderate D1 with 70% proximal stenosis.  The proximal LAD was diffusely disease between D1 and D2, reaching 95% stenosis just after D2 takeoff.  Moderate D2 with 70% ostial stenosis.   Left circumflex (LCx): 90% ostial stenosis.  The AV LCx was small and diffusely diseased.  There wa was a tiny OM1.  There was a moderate OM2 covering significant territory with a long area of up to 70% proximal stenosis.  There was a branch off OM1 with subtotal occlusion ostially.   Right coronary artery (RCA): 40% proximal stenosis, 70% distal stenosis, large PLV with serial 70-80% stenoses, large PDA  with 40% mid-vessel stenosis.   Left ventriculography: Left ventricular systolic function is normal, LVEF is estimated at 60%, there does not appear to be a wall motion abnormality.   Final Conclusions:  Severe 3 vessel disease also with involvement of the left main.  He will need CABG.  CVTS has been contacted will admit to step-down.   Marca Ancona 04/27/2012, 3:10 PM

## 2012-04-27 NOTE — Progress Notes (Signed)
Utilization review completed.  P.J. Jackee Glasner,RN,BSN Case Manager [phone]  

## 2012-04-27 NOTE — ED Notes (Signed)
Cardiology at bedside.

## 2012-04-27 NOTE — ED Notes (Signed)
Transported patient to cath lab #2.

## 2012-04-27 NOTE — ED Notes (Signed)
Called Dr. Llana Aliment to advised him of patients pulse rate/ Dr. Llana Aliment gave telephone order to give 50mg  of metoprolol PO once for patients pulse

## 2012-04-27 NOTE — ED Provider Notes (Signed)
History     CSN: [id number]  Arrival date & time 04/26/12  2315   First MD Initiated Contact with Patient 04/26/12 2352      Chief Complaint  Patient presents with  . Chest Pain    (Consider location/radiation/quality/duration/timing/severity/associated sxs/prior treatment) Patient is a 61 y.o. male presenting with chest pain. The history is provided by the patient.  Chest Pain The chest pain began 1 - 2 hours ago. Chest pain occurs frequently. The chest pain is unchanged (has been happening nearly x 2 weeks not with exertion). Associated with: since bee stings to the back of the head 2 weeks ago. The severity of the pain is severe. The quality of the pain is described as dull. Radiates to: up from shoulders in to the neck and around to the front. Pertinent negatives for primary symptoms include no fever, no fatigue, no syncope, no shortness of breath, no cough, no wheezing, no palpitations, no abdominal pain and no nausea.  Pertinent negatives for associated symptoms include no orthopnea. He tried nothing for the symptoms. Risk factors include male gender.  Pertinent negatives for past medical history include no MI.  Procedure history is negative for cardiac catheterization.     Past Medical History  Diagnosis Date  . Hypertension     History reviewed. No pertinent past surgical history.  No family history on file.  History  Substance Use Topics  . Smoking status: Never Smoker   . Smokeless tobacco: Not on file  . Alcohol Use: Yes      Review of Systems  Constitutional: Negative for fever and fatigue.  Respiratory: Negative for cough, shortness of breath and wheezing.   Cardiovascular: Positive for chest pain. Negative for palpitations, orthopnea and syncope.  Gastrointestinal: Negative for nausea and abdominal pain.  All other systems reviewed and are negative.    Allergies  Penicillins  Home Medications   Current Outpatient Rx  Name  Route  Sig  Dispense   Refill  . ASPIRIN EC 81 MG PO TBEC   Oral   Take 81 mg by mouth daily.         . OMEGA-3 FATTY ACIDS 1000 MG PO CAPS   Oral   Take 1 g by mouth daily.         Marland Kitchen METOPROLOL SUCCINATE ER PO   Oral   Take 1 tablet by mouth daily.           BP 184/98  Pulse 67  Temp 97.7 F (36.5 C) (Oral)  Resp 16  SpO2 99%  Physical Exam  Constitutional: He is oriented to person, place, and time. He appears well-developed and well-nourished. No distress.  HENT:  Head: Normocephalic and atraumatic.  Mouth/Throat: Oropharynx is clear and moist.  Eyes: Conjunctivae normal are normal. Pupils are equal, round, and reactive to light.  Neck: Normal range of motion. Neck supple.  Cardiovascular: Normal rate and regular rhythm.   Pulmonary/Chest: Effort normal and breath sounds normal. No stridor. He has no wheezes.  Abdominal: Soft. Bowel sounds are normal. There is no tenderness. There is no rebound and no guarding.  Musculoskeletal: Normal range of motion.  Lymphadenopathy:    He has no cervical adenopathy.  Neurological: He is alert and oriented to person, place, and time. No cranial nerve deficit.  Skin: Skin is warm and dry.  Psychiatric: He has a normal mood and affect.    ED Course  Procedures (including critical care time)  Labs Reviewed  COMPREHENSIVE METABOLIC PANEL -  Abnormal; Notable for the following:    Glucose, Bld 184 (*)     Total Bilirubin 0.2 (*)     GFR calc non Af Amer 61 (*)     GFR calc Af Amer 71 (*)     All other components within normal limits  CBC WITH DIFFERENTIAL  POCT I-STAT TROPONIN I   Dg Chest 2 View  04/26/2012  *RADIOLOGY REPORT*  Clinical Data: Chest pain.  CHEST - 2 VIEW  Comparison: None.  Findings: The Heart and mediastinal contours are within normal limits.  No focal opacities or effusions.  No acute bony abnormality.  IMPRESSION: No active cardiopulmonary disease.   Original Report Authenticated By: Charlett Nose, M.D.    Dg Cervical Spine  Complete  04/27/2012  *RADIOLOGY REPORT*  Clinical Data: Pain in posterior neck  CERVICAL SPINE - COMPLETE 4+ VIEW  Comparison: 09/19/2006  Findings: Normal alignment of the cervical spine.  The vertebral body heights are preserved.  Disc space narrowing and ventral spurring is noted.  Most severe at C5-6 and C6-7.  There is no fracture or subluxation identified.  IMPRESSION:  1.  Cervical spondylosis.   Original Report Authenticated By: Signa Kell, M.D.      No diagnosis found.    MDM   Date: 04/27/2012  Rate 59  Rhythm: normal sinus rhythm  QRS Axis: normal  Intervals: normal  ST/T Wave abnormalities: nonspecific ST changes  Conduction Disutrbances:left anterior fascicular block  Narrative Interpretation:   Old EKG Reviewed: none available    Will place on CDU chest pain protocol any concerning lesion seen on CT call cardiology      Rebbeca Sheperd K Takeia Ciaravino-Rasch, MD 04/27/12 0130

## 2012-04-27 NOTE — ED Notes (Signed)
Assisted patient to the restroom.  

## 2012-04-28 ENCOUNTER — Inpatient Hospital Stay (HOSPITAL_COMMUNITY): Payer: BC Managed Care – PPO

## 2012-04-28 ENCOUNTER — Encounter (HOSPITAL_COMMUNITY): Payer: Self-pay | Admitting: Certified Registered Nurse Anesthetist

## 2012-04-28 ENCOUNTER — Encounter (HOSPITAL_COMMUNITY)
Admission: EM | Disposition: A | Discharge status: Home / Self Care | Payer: Self-pay | Source: Home / Self Care | Attending: Cardiothoracic Surgery

## 2012-04-28 ENCOUNTER — Encounter (HOSPITAL_COMMUNITY): Payer: Self-pay | Admitting: Anesthesiology

## 2012-04-28 ENCOUNTER — Inpatient Hospital Stay (HOSPITAL_COMMUNITY): Payer: BC Managed Care – PPO | Admitting: Anesthesiology

## 2012-04-28 DIAGNOSIS — I2510 Atherosclerotic heart disease of native coronary artery without angina pectoris: Secondary | ICD-10-CM

## 2012-04-28 HISTORY — PX: CORONARY ARTERY BYPASS GRAFT: SHX141

## 2012-04-28 LAB — CBC
HCT: 39.8 % (ref 39.0–52.0)
HCT: 38 % — ABNORMAL LOW (ref 39.0–52.0)
HCT: 34.2 % — ABNORMAL LOW (ref 39.0–52.0)
Hemoglobin: 13.7 g/dL (ref 13.0–17.0)
Hemoglobin: 13.1 g/dL (ref 13.0–17.0)
Hemoglobin: 12 g/dL — ABNORMAL LOW (ref 13.0–17.0)
MCH: 30.2 pg (ref 26.0–34.0)
MCH: 30.3 pg (ref 26.0–34.0)
MCH: 30.9 pg (ref 26.0–34.0)
MCHC: 34.4 g/dL (ref 30.0–36.0)
MCHC: 34.5 g/dL (ref 30.0–36.0)
MCHC: 35.1 g/dL (ref 30.0–36.0)
MCV: 87.9 fL (ref 78.0–100.0)
MCV: 87.8 fL (ref 78.0–100.0)
MCV: 88.1 fL (ref 78.0–100.0)
Platelets: 181 10*3/uL (ref 150–400)
Platelets: 147 10*3/uL — ABNORMAL LOW (ref 150–400)
Platelets: 170 10*3/uL (ref 150–400)
RBC: 4.53 MIL/uL (ref 4.22–5.81)
RBC: 4.33 MIL/uL (ref 4.22–5.81)
RBC: 3.88 MIL/uL — ABNORMAL LOW (ref 4.22–5.81)
RDW: 13 % (ref 11.5–15.5)
RDW: 12.9 % (ref 11.5–15.5)
RDW: 13.1 % (ref 11.5–15.5)
WBC: 11.5 10*3/uL — ABNORMAL HIGH (ref 4.0–10.5)
WBC: 14 10*3/uL — ABNORMAL HIGH (ref 4.0–10.5)
WBC: 15.2 10*3/uL — ABNORMAL HIGH (ref 4.0–10.5)

## 2012-04-28 LAB — POCT I-STAT, CHEM 8
BUN: 15 mg/dL (ref 6–23)
Calcium, Ion: 1.19 mmol/L (ref 1.13–1.30)
Chloride: 104 mEq/L (ref 96–112)
Creatinine, Ser: 1.1 mg/dL (ref 0.50–1.35)
Glucose, Bld: 143 mg/dL — ABNORMAL HIGH (ref 70–99)
HCT: 32 % — ABNORMAL LOW (ref 39.0–52.0)
Hemoglobin: 10.9 g/dL — ABNORMAL LOW (ref 13.0–17.0)
Potassium: 4.4 mEq/L (ref 3.5–5.1)
Sodium: 140 mEq/L (ref 135–145)
TCO2: 26 mmol/L (ref 0–100)

## 2012-04-28 LAB — POCT I-STAT 3, ART BLOOD GAS (G3+)
Acid-Base Excess: 4 mmol/L — ABNORMAL HIGH (ref 0.0–2.0)
Acid-Base Excess: 3 mmol/L — ABNORMAL HIGH (ref 0.0–2.0)
Acid-Base Excess: 3 mmol/L — ABNORMAL HIGH (ref 0.0–2.0)
Acid-Base Excess: 1 mmol/L (ref 0.0–2.0)
Acid-Base Excess: 2 mmol/L (ref 0.0–2.0)
Bicarbonate: 28.9 mEq/L — ABNORMAL HIGH (ref 20.0–24.0)
Bicarbonate: 27.9 mEq/L — ABNORMAL HIGH (ref 20.0–24.0)
Bicarbonate: 26.7 mEq/L — ABNORMAL HIGH (ref 20.0–24.0)
Bicarbonate: 25.7 mEq/L — ABNORMAL HIGH (ref 20.0–24.0)
Bicarbonate: 27.9 mEq/L — ABNORMAL HIGH (ref 20.0–24.0)
O2 Saturation: 100 %
O2 Saturation: 100 %
O2 Saturation: 92 %
O2 Saturation: 98 %
O2 Saturation: 97 %
Patient temperature: 37
Patient temperature: 36
Patient temperature: 37.4
Patient temperature: 37.5
TCO2: 30 mmol/L (ref 0–100)
TCO2: 29 mmol/L (ref 0–100)
TCO2: 28 mmol/L (ref 0–100)
TCO2: 27 mmol/L (ref 0–100)
TCO2: 29 mmol/L (ref 0–100)
pCO2 arterial: 45.5 mmHg — ABNORMAL HIGH (ref 35.0–45.0)
pCO2 arterial: 41 mmHg (ref 35.0–45.0)
pCO2 arterial: 36.5 mmHg (ref 35.0–45.0)
pCO2 arterial: 40.4 mmHg (ref 35.0–45.0)
pCO2 arterial: 50.7 mmHg — ABNORMAL HIGH (ref 35.0–45.0)
pH, Arterial: 7.41 (ref 7.350–7.450)
pH, Arterial: 7.44 (ref 7.350–7.450)
pH, Arterial: 7.469 — ABNORMAL HIGH (ref 7.350–7.450)
pH, Arterial: 7.412 (ref 7.350–7.450)
pH, Arterial: 7.35 (ref 7.350–7.450)
pO2, Arterial: 280 mmHg — ABNORMAL HIGH (ref 80.0–100.0)
pO2, Arterial: 286 mmHg — ABNORMAL HIGH (ref 80.0–100.0)
pO2, Arterial: 56 mmHg — ABNORMAL LOW (ref 80.0–100.0)
pO2, Arterial: 103 mmHg — ABNORMAL HIGH (ref 80.0–100.0)
pO2, Arterial: 102 mmHg — ABNORMAL HIGH (ref 80.0–100.0)

## 2012-04-28 LAB — HEMOGLOBIN A1C
Hgb A1c MFr Bld: 6.1 % — ABNORMAL HIGH (ref <5.7)
Mean Plasma Glucose: 128 mg/dL — ABNORMAL HIGH (ref <117)

## 2012-04-28 LAB — LIPID PANEL
Cholesterol: 177 mg/dL (ref 0–200)
HDL: 44 mg/dL (ref >39)
LDL Cholesterol: 110 mg/dL — ABNORMAL HIGH (ref 0–99)
Total CHOL/HDL Ratio: 4 RATIO
Triglycerides: 113 mg/dL (ref <150)
VLDL: 23 mg/dL (ref 0–40)

## 2012-04-28 LAB — PROTIME-INR
INR: 1.17 (ref 0.00–1.49)
INR: 1.4 (ref 0.00–1.49)
Prothrombin Time: 14.7 seconds (ref 11.6–15.2)
Prothrombin Time: 16.8 seconds — ABNORMAL HIGH (ref 11.6–15.2)

## 2012-04-28 LAB — POCT I-STAT 4, (NA,K, GLUC, HGB,HCT)
Glucose, Bld: 174 mg/dL — ABNORMAL HIGH (ref 70–99)
Glucose, Bld: 129 mg/dL — ABNORMAL HIGH (ref 70–99)
Glucose, Bld: 119 mg/dL — ABNORMAL HIGH (ref 70–99)
Glucose, Bld: 118 mg/dL — ABNORMAL HIGH (ref 70–99)
Glucose, Bld: 138 mg/dL — ABNORMAL HIGH (ref 70–99)
Glucose, Bld: 139 mg/dL — ABNORMAL HIGH (ref 70–99)
HCT: 38 % — ABNORMAL LOW (ref 39.0–52.0)
HCT: 36 % — ABNORMAL LOW (ref 39.0–52.0)
HCT: 31 % — ABNORMAL LOW (ref 39.0–52.0)
HCT: 28 % — ABNORMAL LOW (ref 39.0–52.0)
HCT: 35 % — ABNORMAL LOW (ref 39.0–52.0)
HCT: 37 % — ABNORMAL LOW (ref 39.0–52.0)
Hemoglobin: 12.9 g/dL — ABNORMAL LOW (ref 13.0–17.0)
Hemoglobin: 12.2 g/dL — ABNORMAL LOW (ref 13.0–17.0)
Hemoglobin: 10.5 g/dL — ABNORMAL LOW (ref 13.0–17.0)
Hemoglobin: 9.5 g/dL — ABNORMAL LOW (ref 13.0–17.0)
Hemoglobin: 11.9 g/dL — ABNORMAL LOW (ref 13.0–17.0)
Hemoglobin: 12.6 g/dL — ABNORMAL LOW (ref 13.0–17.0)
Potassium: 3.7 mEq/L (ref 3.5–5.1)
Potassium: 3.9 mEq/L (ref 3.5–5.1)
Potassium: 4.1 mEq/L (ref 3.5–5.1)
Potassium: 4.8 mEq/L (ref 3.5–5.1)
Potassium: 4 mEq/L (ref 3.5–5.1)
Potassium: 3.6 mEq/L (ref 3.5–5.1)
Sodium: 141 mEq/L (ref 135–145)
Sodium: 143 mEq/L (ref 135–145)
Sodium: 139 mEq/L (ref 135–145)
Sodium: 136 mEq/L (ref 135–145)
Sodium: 140 mEq/L (ref 135–145)
Sodium: 141 mEq/L (ref 135–145)

## 2012-04-28 LAB — GLUCOSE, CAPILLARY
Glucose-Capillary: 138 mg/dL — ABNORMAL HIGH (ref 70–99)
Glucose-Capillary: 137 mg/dL — ABNORMAL HIGH (ref 70–99)

## 2012-04-28 LAB — POCT I-STAT 3, VENOUS BLOOD GAS (G3P V)
Acid-Base Excess: 2 mmol/L (ref 0.0–2.0)
Bicarbonate: 27.2 mEq/L — ABNORMAL HIGH (ref 20.0–24.0)
O2 Saturation: 77 %
Patient temperature: 35.6
TCO2: 29 mmol/L (ref 0–100)
pCO2, Ven: 42.6 mmHg — ABNORMAL LOW (ref 45.0–50.0)
pH, Ven: 7.408 — ABNORMAL HIGH (ref 7.250–7.300)
pO2, Ven: 39 mmHg (ref 30.0–45.0)

## 2012-04-28 LAB — APTT
aPTT: 29 seconds (ref 24–37)
aPTT: 24 seconds (ref 24–37)

## 2012-04-28 LAB — PLATELET COUNT: Platelets: 106 10*3/uL — ABNORMAL LOW (ref 150–400)

## 2012-04-28 LAB — CREATININE, SERUM
Creatinine, Ser: 1 mg/dL (ref 0.50–1.35)
GFR calc Af Amer: >90 mL/min (ref >90)
GFR calc non Af Amer: 80 mL/min — ABNORMAL LOW (ref >90)

## 2012-04-28 LAB — BASIC METABOLIC PANEL
BUN: 20 mg/dL (ref 6–23)
CO2: 27 mEq/L (ref 19–32)
Calcium: 9.4 mg/dL (ref 8.4–10.5)
Chloride: 104 mEq/L (ref 96–112)
Creatinine, Ser: 1.22 mg/dL (ref 0.50–1.35)
GFR calc Af Amer: 73 mL/min — ABNORMAL LOW (ref >90)
GFR calc non Af Amer: 63 mL/min — ABNORMAL LOW (ref >90)
Glucose, Bld: 147 mg/dL — ABNORMAL HIGH (ref 70–99)
Potassium: 3.9 mEq/L (ref 3.5–5.1)
Sodium: 140 mEq/L (ref 135–145)

## 2012-04-28 LAB — POCT I-STAT GLUCOSE
Glucose, Bld: 132 mg/dL — ABNORMAL HIGH (ref 70–99)
Glucose, Bld: 124 mg/dL — ABNORMAL HIGH (ref 70–99)
Operator id: 305741
Operator id: 3390

## 2012-04-28 LAB — HEMOGLOBIN AND HEMATOCRIT, BLOOD
HCT: 28.1 % — ABNORMAL LOW (ref 39.0–52.0)
Hemoglobin: 9.7 g/dL — ABNORMAL LOW (ref 13.0–17.0)

## 2012-04-28 LAB — MAGNESIUM: Magnesium: 2.8 mg/dL — ABNORMAL HIGH (ref 1.5–2.5)

## 2012-04-28 SURGERY — CORONARY ARTERY BYPASS GRAFTING (CABG)
Anesthesia: General | Site: Chest | Wound class: Clean

## 2012-04-28 MED ORDER — HEMOSTATIC AGENTS (NO CHARGE) OPTIME
TOPICAL | Status: DC | PRN
  Administered 2012-04-28 (×2): 1 via TOPICAL

## 2012-04-28 MED ORDER — ASPIRIN 81 MG PO CHEW: 324.0000 mg | CHEWABLE_TABLET | Freq: Every day | ORAL | Status: DC

## 2012-04-28 MED ORDER — PROPOFOL 10 MG/ML IV BOLUS
INTRAVENOUS | Status: DC | PRN
  Administered 2012-04-28: 90 mg via INTRAVENOUS

## 2012-04-28 MED ORDER — ACETAMINOPHEN 500 MG PO TABS
1000.0000 mg | ORAL_TABLET | Freq: Four times a day (QID) | ORAL | Status: AC
  Administered 2012-04-29 – 2012-05-03 (×19): 1000 mg via ORAL
  Filled 2012-04-28 (×21): qty 2

## 2012-04-28 MED ORDER — ASPIRIN EC 325 MG PO TBEC
325.0000 mg | DELAYED_RELEASE_TABLET | Freq: Every day | ORAL | Status: DC
  Filled 2012-04-28: qty 1

## 2012-04-28 MED ORDER — NITROGLYCERIN IN D5W 200-5 MCG/ML-% IV SOLN: 0.0000 ug/min | INTRAVENOUS | Status: DC

## 2012-04-28 MED ORDER — AMIODARONE HCL IN DEXTROSE 360-4.14 MG/200ML-% IV SOLN
30.0000 mg/h | INTRAVENOUS | Status: DC
  Administered 2012-04-29 (×2): 30 mg/h via INTRAVENOUS
  Filled 2012-04-28 (×4): qty 200

## 2012-04-28 MED ORDER — POTASSIUM CHLORIDE 10 MEQ/50ML IV SOLN
10.0000 meq | INTRAVENOUS | Status: AC
  Administered 2012-04-28 (×3): 10 meq via INTRAVENOUS

## 2012-04-28 MED ORDER — MAGNESIUM SULFATE 40 MG/ML IJ SOLN
4.0000 g | Freq: Once | INTRAMUSCULAR | Status: AC
  Administered 2012-04-28: 4 g via INTRAVENOUS
  Filled 2012-04-28: qty 100

## 2012-04-28 MED ORDER — SODIUM CHLORIDE 0.9 % IJ SOLN
3.0000 mL | Freq: Two times a day (BID) | INTRAMUSCULAR | Status: DC
  Administered 2012-04-29 – 2012-05-03 (×7): 3 mL via INTRAVENOUS

## 2012-04-28 MED ORDER — EPHEDRINE SULFATE 50 MG/ML IJ SOLN
INTRAMUSCULAR | Status: DC | PRN
  Administered 2012-04-28 (×2): 5 mg via INTRAVENOUS
  Administered 2012-04-28: 2.5 mg via INTRAVENOUS
  Administered 2012-04-28: 5 mg via INTRAVENOUS

## 2012-04-28 MED ORDER — DEXMEDETOMIDINE HCL IN NACL 400 MCG/100ML IV SOLN
0.4000 ug/kg/h | INTRAVENOUS | Status: DC
  Filled 2012-04-28: qty 100

## 2012-04-28 MED ORDER — SODIUM CHLORIDE 0.45 % IV SOLN: INTRAVENOUS | Status: DC

## 2012-04-28 MED ORDER — PHENYLEPHRINE HCL 10 MG/ML IJ SOLN
0.0000 ug/min | INTRAVENOUS | Status: DC
  Administered 2012-04-29: 25 ug/min via INTRAVENOUS
  Filled 2012-04-28 (×3): qty 2

## 2012-04-28 MED ORDER — FENTANYL CITRATE 0.05 MG/ML IJ SOLN
INTRAMUSCULAR | Status: DC | PRN
  Administered 2012-04-28 (×4): 250 ug via INTRAVENOUS
  Administered 2012-04-28: 100 ug via INTRAVENOUS
  Administered 2012-04-28: 250 ug via INTRAVENOUS
  Administered 2012-04-28: 100 ug via INTRAVENOUS
  Administered 2012-04-28: 50 ug via INTRAVENOUS
  Administered 2012-04-28: 250 ug via INTRAVENOUS

## 2012-04-28 MED ORDER — DEXMEDETOMIDINE HCL IN NACL 200 MCG/50ML IV SOLN
0.1000 ug/kg/h | INTRAVENOUS | Status: DC
  Administered 2012-04-28: 0.5 ug/kg/h via INTRAVENOUS

## 2012-04-28 MED ORDER — METOPROLOL TARTRATE 12.5 MG HALF TABLET
12.5000 mg | ORAL_TABLET | Freq: Two times a day (BID) | ORAL | Status: DC
  Filled 2012-04-28 (×3): qty 1

## 2012-04-28 MED ORDER — METOPROLOL TARTRATE 25 MG/10 ML ORAL SUSPENSION
12.5000 mg | Freq: Two times a day (BID) | ORAL | Status: DC
  Filled 2012-04-28 (×3): qty 5

## 2012-04-28 MED ORDER — LACTATED RINGERS IV SOLN: INTRAVENOUS | Status: DC

## 2012-04-28 MED ORDER — AMIODARONE HCL IN DEXTROSE 360-4.14 MG/200ML-% IV SOLN
60.0000 mg/h | INTRAVENOUS | Status: AC
  Administered 2012-04-28 – 2012-04-29 (×2): 60 mg/h via INTRAVENOUS
  Filled 2012-04-28: qty 200

## 2012-04-28 MED ORDER — OXYCODONE HCL 5 MG PO TABS
5.0000 mg | ORAL_TABLET | ORAL | Status: DC | PRN
  Administered 2012-04-29: 5 mg via ORAL
  Administered 2012-04-29 – 2012-05-03 (×14): 10 mg via ORAL
  Filled 2012-04-28 (×8): qty 2
  Filled 2012-04-28: qty 1
  Filled 2012-04-28 (×6): qty 2

## 2012-04-28 MED ORDER — SODIUM CHLORIDE 0.9 % IJ SOLN
OROMUCOSAL | Status: DC | PRN
  Administered 2012-04-28: 09:00:00 via TOPICAL

## 2012-04-28 MED ORDER — DOPAMINE-DEXTROSE 3.2-5 MG/ML-% IV SOLN: 2.5000 ug/kg/min | INTRAVENOUS | Status: DC

## 2012-04-28 MED ORDER — LACTATED RINGERS IV SOLN: 500.0000 mL | Freq: Once | INTRAVENOUS | Status: AC | PRN

## 2012-04-28 MED ORDER — INSULIN REGULAR BOLUS VIA INFUSION
0.0000 [IU] | Freq: Three times a day (TID) | INTRAVENOUS | Status: DC
  Administered 2012-04-28: 0 [IU] via INTRAVENOUS
  Administered 2012-04-29: 2 [IU] via INTRAVENOUS
  Filled 2012-04-28: qty 10

## 2012-04-28 MED ORDER — ROCURONIUM BROMIDE 100 MG/10ML IV SOLN
INTRAVENOUS | Status: DC | PRN
  Administered 2012-04-28: 50 mg via INTRAVENOUS

## 2012-04-28 MED ORDER — MORPHINE SULFATE 2 MG/ML IJ SOLN
2.0000 mg | INTRAMUSCULAR | Status: DC | PRN
  Administered 2012-04-28 (×3): 2 mg via INTRAVENOUS
  Administered 2012-04-28 (×3): 4 mg via INTRAVENOUS
  Administered 2012-04-28: 2 mg via INTRAVENOUS
  Administered 2012-04-29 (×4): 4 mg via INTRAVENOUS
  Filled 2012-04-28 (×2): qty 2
  Filled 2012-04-28 (×2): qty 1
  Filled 2012-04-28: qty 2
  Filled 2012-04-28: qty 1
  Filled 2012-04-28 (×4): qty 2

## 2012-04-28 MED ORDER — FAMOTIDINE IN NACL 20-0.9 MG/50ML-% IV SOLN
20.0000 mg | Freq: Two times a day (BID) | INTRAVENOUS | Status: DC
  Administered 2012-04-28: 20 mg via INTRAVENOUS

## 2012-04-28 MED ORDER — LEVOFLOXACIN IN D5W 500 MG/100ML IV SOLN
500.0000 mg | Freq: Once | INTRAVENOUS | Status: AC
  Filled 2012-04-28: qty 100

## 2012-04-28 MED ORDER — SODIUM CHLORIDE 0.9 % IV SOLN: 100.0000 [IU] | INTRAVENOUS | Status: DC | PRN

## 2012-04-28 MED ORDER — POTASSIUM CHLORIDE 10 MEQ/50ML IV SOLN
10.0000 meq | Freq: Once | INTRAVENOUS | Status: AC
  Administered 2012-04-28: 10 meq via INTRAVENOUS

## 2012-04-28 MED ORDER — LACTATED RINGERS IV SOLN
INTRAVENOUS | Status: DC | PRN
  Administered 2012-04-28: 07:00:00 via INTRAVENOUS

## 2012-04-28 MED ORDER — HEPARIN SODIUM (PORCINE) 1000 UNIT/ML IJ SOLN
INTRAMUSCULAR | Status: DC | PRN
  Administered 2012-04-28: 39000 [IU] via INTRAVENOUS
  Administered 2012-04-28: 5000 [IU] via INTRAVENOUS

## 2012-04-28 MED ORDER — ALBUMIN HUMAN 5 % IV SOLN
250.0000 mL | INTRAVENOUS | Status: AC | PRN
  Administered 2012-04-28 (×3): 250 mL via INTRAVENOUS

## 2012-04-28 MED ORDER — MIDAZOLAM HCL 5 MG/5ML IJ SOLN
INTRAMUSCULAR | Status: DC | PRN
  Administered 2012-04-28: 2.5 mg via INTRAVENOUS
  Administered 2012-04-28: 5 mg via INTRAVENOUS
  Administered 2012-04-28: 2.5 mg via INTRAVENOUS

## 2012-04-28 MED ORDER — KETOROLAC TROMETHAMINE 15 MG/ML IJ SOLN
15.0000 mg | Freq: Four times a day (QID) | INTRAMUSCULAR | Status: DC
  Administered 2012-04-29 (×2): 15 mg via INTRAVENOUS
  Filled 2012-04-28 (×6): qty 1

## 2012-04-28 MED ORDER — SODIUM CHLORIDE 0.9 % IV SOLN: 250.0000 mL | INTRAVENOUS | Status: DC

## 2012-04-28 MED ORDER — PANTOPRAZOLE SODIUM 40 MG PO TBEC
40.0000 mg | DELAYED_RELEASE_TABLET | Freq: Every day | ORAL | Status: DC
  Administered 2012-04-30 – 2012-05-04 (×5): 40 mg via ORAL
  Filled 2012-04-28 (×5): qty 1

## 2012-04-28 MED ORDER — ONDANSETRON HCL 4 MG/2ML IJ SOLN
4.0000 mg | Freq: Four times a day (QID) | INTRAMUSCULAR | Status: DC | PRN
  Administered 2012-04-30: 4 mg via INTRAVENOUS
  Filled 2012-04-28: qty 2

## 2012-04-28 MED ORDER — ARTIFICIAL TEARS OP OINT
TOPICAL_OINTMENT | OPHTHALMIC | Status: DC | PRN
  Administered 2012-04-28: 1 via OPHTHALMIC

## 2012-04-28 MED ORDER — LACTATED RINGERS IV SOLN
INTRAVENOUS | Status: DC | PRN
  Administered 2012-04-28 (×2): via INTRAVENOUS

## 2012-04-28 MED ORDER — AMINOCAPROIC ACID 250 MG/ML IV SOLN
INTRAVENOUS | Status: DC
  Filled 2012-04-28: qty 40

## 2012-04-28 MED ORDER — SODIUM CHLORIDE 0.9 % IV SOLN: 10.0000 g | INTRAVENOUS | Status: DC | PRN

## 2012-04-28 MED ORDER — ACETAMINOPHEN 160 MG/5ML PO SOLN: 975.0000 mg | Freq: Four times a day (QID) | ORAL | Status: DC

## 2012-04-28 MED ORDER — ACETAMINOPHEN 10 MG/ML IV SOLN
1000.0000 mg | Freq: Once | INTRAVENOUS | Status: AC
  Administered 2012-04-28: 1000 mg via INTRAVENOUS
  Filled 2012-04-28: qty 100

## 2012-04-28 MED ORDER — BISACODYL 10 MG RE SUPP: 10.0000 mg | Freq: Every day | RECTAL | Status: DC

## 2012-04-28 MED ORDER — VECURONIUM BROMIDE 10 MG IV SOLR
INTRAVENOUS | Status: DC | PRN
  Administered 2012-04-28: 10 mg via INTRAVENOUS
  Administered 2012-04-28: 5 mg via INTRAVENOUS
  Administered 2012-04-28: 10 mg via INTRAVENOUS
  Administered 2012-04-28: 5 mg via INTRAVENOUS
  Administered 2012-04-28: 10 mg via INTRAVENOUS

## 2012-04-28 MED ORDER — LEVOFLOXACIN IN D5W 500 MG/100ML IV SOLN
500.0000 mg | Freq: Once | INTRAVENOUS | Status: AC
  Administered 2012-04-29: 500 mg via INTRAVENOUS
  Filled 2012-04-28: qty 100

## 2012-04-28 MED ORDER — SODIUM CHLORIDE 0.9 % IV SOLN
INTRAVENOUS | Status: DC
  Administered 2012-04-28: 0.7 [IU]/h via INTRAVENOUS
  Filled 2012-04-28 (×2): qty 1

## 2012-04-28 MED ORDER — VANCOMYCIN HCL IN DEXTROSE 1-5 GM/200ML-% IV SOLN
1000.0000 mg | Freq: Once | INTRAVENOUS | Status: AC
  Administered 2012-04-28: 1000 mg via INTRAVENOUS
  Filled 2012-04-28: qty 200

## 2012-04-28 MED ORDER — METOPROLOL TARTRATE 1 MG/ML IV SOLN: 2.5000 mg | INTRAVENOUS | Status: DC | PRN

## 2012-04-28 MED ORDER — CHLORHEXIDINE GLUCONATE 4 % EX LIQD
CUTANEOUS | Status: AC
  Filled 2012-04-28: qty 60

## 2012-04-28 MED ORDER — GLYCOPYRROLATE 0.2 MG/ML IJ SOLN
INTRAMUSCULAR | Status: DC | PRN
  Administered 2012-04-28: 0.1 mg via INTRAVENOUS

## 2012-04-28 MED ORDER — MIDAZOLAM HCL 2 MG/2ML IJ SOLN: 2.0000 mg | INTRAMUSCULAR | Status: DC | PRN

## 2012-04-28 MED ORDER — SODIUM CHLORIDE 0.9 % IJ SOLN: 3.0000 mL | INTRAMUSCULAR | Status: DC | PRN

## 2012-04-28 MED ORDER — AMIODARONE LOAD VIA INFUSION
150.0000 mg | Freq: Once | INTRAVENOUS | Status: AC
  Administered 2012-04-28: 150 mg via INTRAVENOUS
  Filled 2012-04-28: qty 83.34

## 2012-04-28 MED ORDER — 0.9 % SODIUM CHLORIDE (POUR BTL) OPTIME
TOPICAL | Status: DC | PRN
  Administered 2012-04-28: 1000 mL

## 2012-04-28 MED ORDER — MORPHINE SULFATE 2 MG/ML IJ SOLN
1.0000 mg | INTRAMUSCULAR | Status: AC | PRN
  Filled 2012-04-28: qty 1

## 2012-04-28 MED ORDER — SODIUM CHLORIDE 0.9 % IV SOLN
INTRAVENOUS | Status: DC
  Administered 2012-04-28: 20 mL/h via INTRAVENOUS

## 2012-04-28 MED ORDER — DEXMEDETOMIDINE HCL IN NACL 200 MCG/50ML IV SOLN: INTRAVENOUS | Status: DC | PRN

## 2012-04-28 MED ORDER — DOCUSATE SODIUM 100 MG PO CAPS
200.0000 mg | ORAL_CAPSULE | Freq: Every day | ORAL | Status: DC
  Administered 2012-04-29 – 2012-04-30 (×2): 200 mg via ORAL
  Filled 2012-04-28 (×2): qty 2

## 2012-04-28 MED ORDER — BISACODYL 5 MG PO TBEC
10.0000 mg | DELAYED_RELEASE_TABLET | Freq: Every day | ORAL | Status: DC
  Administered 2012-04-29 – 2012-05-04 (×6): 10 mg via ORAL
  Filled 2012-04-28: qty 2
  Filled 2012-04-28: qty 1
  Filled 2012-04-28 (×2): qty 2
  Filled 2012-04-28: qty 1
  Filled 2012-04-28: qty 2

## 2012-04-28 MED ORDER — NITROGLYCERIN IN D5W 200-5 MCG/ML-% IV SOLN
INTRAVENOUS | Status: DC | PRN
  Administered 2012-04-28: 10 ug/min via INTRAVENOUS

## 2012-04-28 MED ORDER — AMIODARONE HCL IN DEXTROSE 360-4.14 MG/200ML-% IV SOLN
INTRAVENOUS | Status: AC
  Filled 2012-04-28: qty 200

## 2012-04-28 SURGICAL SUPPLY — 119 items
ADAPTER CARDIO PERF ANTE/RETRO (ADAPTER) ×3 IMPLANT
ATTRACTOMAT 16X20 MAGNETIC DRP (DRAPES) ×3 IMPLANT
BAG DECANTER FOR FLEXI CONT (MISCELLANEOUS) ×3 IMPLANT
BANDAGE ELASTIC 4 VELCRO ST LF (GAUZE/BANDAGES/DRESSINGS) ×6 IMPLANT
BANDAGE ELASTIC 6 VELCRO ST LF (GAUZE/BANDAGES/DRESSINGS) ×6 IMPLANT
BANDAGE GAUZE ELAST BULKY 4 IN (GAUZE/BANDAGES/DRESSINGS) ×6 IMPLANT
BASKET HEART  (ORDER IN 25'S) (MISCELLANEOUS) ×1
BASKET HEART (ORDER IN 25'S) (MISCELLANEOUS) ×1
BASKET HEART (ORDER IN 25S) (MISCELLANEOUS) ×1 IMPLANT
BENZOIN TINCTURE PRP APPL 2/3 (GAUZE/BANDAGES/DRESSINGS) ×3 IMPLANT
BLADE MINI RND TIP GREEN BEAV (BLADE) ×3 IMPLANT
BLADE STERNUM SYSTEM 6 (BLADE) ×3 IMPLANT
BLADE SURG 11 STRL SS (BLADE) ×3 IMPLANT
BLADE SURG 12 STRL SS (BLADE) ×3 IMPLANT
BLADE SURG ROTATE 9660 (MISCELLANEOUS) ×3 IMPLANT
CANISTER SUCTION 2500CC (MISCELLANEOUS) ×3 IMPLANT
CANNULA AORTIC HI-FLOW 6.5M20F (CANNULA) ×3 IMPLANT
CANNULA ARTERIAL NVNT 3/8 22FR (MISCELLANEOUS) ×3 IMPLANT
CANNULA GUNDRY RCSP 15FR (MISCELLANEOUS) ×3 IMPLANT
CANNULA VENOUS MAL SGL STG 40 (MISCELLANEOUS) IMPLANT
CANNULAE VENOUS MAL SGL STG 40 (MISCELLANEOUS)
CATH CPB KIT VANTRIGT (MISCELLANEOUS) ×3 IMPLANT
CATH ROBINSON RED A/P 14FR (CATHETERS) ×3 IMPLANT
CATH ROBINSON RED A/P 18FR (CATHETERS) ×9 IMPLANT
CATH THORACIC 28FR (CATHETERS) IMPLANT
CATH THORACIC 28FR RT ANG (CATHETERS) IMPLANT
CATH THORACIC 36FR (CATHETERS) IMPLANT
CATH THORACIC 36FR RT ANG (CATHETERS) ×6 IMPLANT
CLIP RETRACTION 3.0MM CORONARY (MISCELLANEOUS) ×6 IMPLANT
CLIP TI WIDE RED SMALL 24 (CLIP) ×3 IMPLANT
CLOSURE STERI-STRIP 1/2X4 (GAUZE/BANDAGES/DRESSINGS) ×1
CLOTH BEACON ORANGE TIMEOUT ST (SAFETY) ×3 IMPLANT
CLSR STERI-STRIP ANTIMIC 1/2X4 (GAUZE/BANDAGES/DRESSINGS) ×2 IMPLANT
COVER SURGICAL LIGHT HANDLE (MISCELLANEOUS) ×3 IMPLANT
CRADLE DONUT ADULT HEAD (MISCELLANEOUS) ×3 IMPLANT
DRAIN CHANNEL 32F RND 10.7 FF (WOUND CARE) ×3 IMPLANT
DRAPE CARDIOVASCULAR INCISE (DRAPES) ×2
DRAPE SLUSH MACHINE 52X66 (DRAPES) IMPLANT
DRAPE SLUSH/WARMER DISC (DRAPES) IMPLANT
DRAPE SRG 135X102X78XABS (DRAPES) ×1 IMPLANT
DRSG COVADERM 4X14 (GAUZE/BANDAGES/DRESSINGS) ×3 IMPLANT
ELECT BLADE 4.0 EZ CLEAN MEGAD (MISCELLANEOUS) ×3
ELECT BLADE 6.5 EXT (BLADE) ×3 IMPLANT
ELECT CAUTERY BLADE 6.4 (BLADE) ×3 IMPLANT
ELECT REM PT RETURN 9FT ADLT (ELECTROSURGICAL) ×6
ELECTRODE BLDE 4.0 EZ CLN MEGD (MISCELLANEOUS) ×1 IMPLANT
ELECTRODE REM PT RTRN 9FT ADLT (ELECTROSURGICAL) ×2 IMPLANT
GLOVE BIO SURGEON STRL SZ 6 (GLOVE) ×12 IMPLANT
GLOVE BIO SURGEON STRL SZ7.5 (GLOVE) ×15 IMPLANT
GLOVE BIOGEL PI IND STRL 6 (GLOVE) ×1 IMPLANT
GLOVE BIOGEL PI IND STRL 7.0 (GLOVE) ×2 IMPLANT
GLOVE BIOGEL PI INDICATOR 6 (GLOVE) ×2
GLOVE BIOGEL PI INDICATOR 7.0 (GLOVE) ×4
GLOVE SURG SS PI 6.5 STRL IVOR (GLOVE) ×9 IMPLANT
GOWN PREVENTION PLUS XLARGE (GOWN DISPOSABLE) ×3 IMPLANT
GOWN STRL NON-REIN LRG LVL3 (GOWN DISPOSABLE) ×18 IMPLANT
HEMOSTAT POWDER SURGIFOAM 1G (HEMOSTASIS) ×9 IMPLANT
HEMOSTAT SURGICEL 2X14 (HEMOSTASIS) ×3 IMPLANT
INSERT FOGARTY XLG (MISCELLANEOUS) IMPLANT
KIT BASIN OR (CUSTOM PROCEDURE TRAY) ×3 IMPLANT
KIT ROOM TURNOVER OR (KITS) ×3 IMPLANT
KIT SUCTION CATH 14FR (SUCTIONS) ×3 IMPLANT
KIT VASOVIEW 6 PRO VH 2400 (KITS) ×3 IMPLANT
KIT VASOVIEW W/TROCAR VH 2000 (KITS) IMPLANT
LEAD PACING MYOCARDI (MISCELLANEOUS) ×3 IMPLANT
MARKER GRAFT CORONARY BYPASS (MISCELLANEOUS) ×12 IMPLANT
NS IRRIG 1000ML POUR BTL (IV SOLUTION) ×21 IMPLANT
PACK OPEN HEART (CUSTOM PROCEDURE TRAY) ×3 IMPLANT
PAD ARMBOARD 7.5X6 YLW CONV (MISCELLANEOUS) ×6 IMPLANT
PAD DEFIB R2 (MISCELLANEOUS) ×3 IMPLANT
PEDIATRIC SUCKERS (MISCELLANEOUS) ×3 IMPLANT
PENCIL BUTTON HOLSTER BLD 10FT (ELECTRODE) ×3 IMPLANT
PUNCH AORTIC ROTATE 4.0MM (MISCELLANEOUS) ×3 IMPLANT
PUNCH AORTIC ROTATE 4.5MM 8IN (MISCELLANEOUS) IMPLANT
PUNCH AORTIC ROTATE 5MM 8IN (MISCELLANEOUS) IMPLANT
SET CARDIOPLEGIA MPS 5001102 (MISCELLANEOUS) ×3 IMPLANT
SPONGE GAUZE 4X4 12PLY (GAUZE/BANDAGES/DRESSINGS) ×12 IMPLANT
SPONGE LAP 18X18 X RAY DECT (DISPOSABLE) ×6 IMPLANT
SURGIFLO W/THROMBIN 8M KIT (HEMOSTASIS) ×3 IMPLANT
SUT BONE WAX W31G (SUTURE) ×3 IMPLANT
SUT MNCRL AB 4-0 PS2 18 (SUTURE) ×3 IMPLANT
SUT PROLENE 3 0 SH DA (SUTURE) IMPLANT
SUT PROLENE 3 0 SH1 36 (SUTURE) IMPLANT
SUT PROLENE 4 0 RB 1 (SUTURE) ×4
SUT PROLENE 4 0 SH DA (SUTURE) ×3 IMPLANT
SUT PROLENE 4-0 RB1 .5 CRCL 36 (SUTURE) ×2 IMPLANT
SUT PROLENE 5 0 C 1 36 (SUTURE) IMPLANT
SUT PROLENE 6 0 C 1 30 (SUTURE) ×18 IMPLANT
SUT PROLENE 6 0 CC (SUTURE) ×6 IMPLANT
SUT PROLENE 7 0 DA (SUTURE) IMPLANT
SUT PROLENE 7.0 RB 3 (SUTURE) ×12 IMPLANT
SUT PROLENE 8 0 BV175 6 (SUTURE) ×12 IMPLANT
SUT PROLENE BLUE 7 0 (SUTURE) ×12 IMPLANT
SUT SILK  1 MH (SUTURE)
SUT SILK 1 MH (SUTURE) IMPLANT
SUT SILK 2 0 SH CR/8 (SUTURE) ×3 IMPLANT
SUT SILK 3 0 SH CR/8 (SUTURE) ×3 IMPLANT
SUT STEEL 6MS V (SUTURE) ×6 IMPLANT
SUT STEEL STERNAL CCS#1 18IN (SUTURE) ×3 IMPLANT
SUT STEEL SZ 6 DBL 3X14 BALL (SUTURE) ×3 IMPLANT
SUT VIC AB 1 CTX 36 (SUTURE) ×4
SUT VIC AB 1 CTX36XBRD ANBCTR (SUTURE) ×2 IMPLANT
SUT VIC AB 2-0 CT1 27 (SUTURE) ×2
SUT VIC AB 2-0 CT1 TAPERPNT 27 (SUTURE) ×1 IMPLANT
SUT VIC AB 2-0 CTX 27 (SUTURE) IMPLANT
SUT VIC AB 2-0 UR6 27 (SUTURE) ×3 IMPLANT
SUT VIC AB 3-0 SH 27 (SUTURE)
SUT VIC AB 3-0 SH 27X BRD (SUTURE) IMPLANT
SUT VIC AB 3-0 X1 27 (SUTURE) IMPLANT
SUT VICRYL 4-0 PS2 18IN ABS (SUTURE) IMPLANT
SUTURE E-PAK OPEN HEART (SUTURE) ×3 IMPLANT
SYSTEM SAHARA CHEST DRAIN ATS (WOUND CARE) ×3 IMPLANT
TAPE CLOTH SURG 4X10 WHT LF (GAUZE/BANDAGES/DRESSINGS) ×6 IMPLANT
TOWEL OR 17X24 6PK STRL BLUE (TOWEL DISPOSABLE) ×6 IMPLANT
TOWEL OR 17X26 10 PK STRL BLUE (TOWEL DISPOSABLE) ×6 IMPLANT
TRAY FOLEY IC TEMP SENS 14FR (CATHETERS) ×3 IMPLANT
TUBING INSUFFLATION 10FT LAP (TUBING) ×3 IMPLANT
UNDERPAD 30X30 INCONTINENT (UNDERPADS AND DIAPERS) ×3 IMPLANT
WATER STERILE IRR 1000ML POUR (IV SOLUTION) ×6 IMPLANT

## 2012-04-28 NOTE — Preoperative (Signed)
Beta Blockers   Reason not to administer Beta Blockers:Not Applicable, AM dose BB given.  12.5 po Metoprolol.

## 2012-04-28 NOTE — Brief Op Note (Signed)
04/26/2012 - 04/28/2012  12:55 PM  PATIENT:  [name] [name]  61 y.o. male  PRE-OPERATIVE DIAGNOSIS:  CAD  POST-OPERATIVE DIAGNOSIS:  CAD  PROCEDURE:  CORONARY ARTERY BYPASS GRAFTING x 5 (LIMA-LAD, SVG-D, SVG-OM, SVG-PD-PL), EVH Right leg  SURGEON:  Surgeon(s): [name], MD  ASSISTANT: Coral Ceo, PA-C  ANESTHESIA:   general  PATIENT CONDITION:  ICU - intubated and hemodynamically stable.  PRE-OPERATIVE WEIGHT: 108 kg

## 2012-04-28 NOTE — Transfer of Care (Signed)
Immediate Anesthesia Transfer of Care Note  Patient: [name] [name]  Procedure(s) Performed: Procedure(s) (LRB) with comments: CORONARY ARTERY BYPASS GRAFTING (CABG) (N/A) - Right saphenous vein used for bypass grafting. Five  bypasses performed including left mammary artery.   Patient Location: ICU  Anesthesia Type:General  Level of Consciousness: sedated and unresponsive  Airway & Oxygen Therapy: Patient remains intubated per anesthesia plan and Patient placed on Ventilator (see vital sign flow sheet for setting)  Post-op Assessment: Report given to PACU RN and Post -op Vital signs reviewed and stable  Post vital signs: Reviewed and stable  Complications: No apparent anesthesia complications

## 2012-04-28 NOTE — ED Provider Notes (Signed)
Medical screening examination/treatment/procedure(s) were performed by non-physician practitioner and as supervising physician I was immediately available for consultation/collaboration.  Candas Deemer R. Atiba Kimberlin, MD 04/28/12 1052 

## 2012-04-28 NOTE — Procedures (Signed)
Extubation Procedure Note  Patient Details:   Name: Mortimer Parma DOB: 1950-08-03 MRN: [id number]  Pt extubated per protocol, VS stable, NIF & VC WNL, pt tolerating well, RT monitor    Evaluation  O2 sats: stable throughout Complications: No apparent complications Patient did tolerate procedure well. Bilateral Breath Sounds: Clear Suctioning: Oral Yes  Harley Hallmark 04/28/2012, 6:57 PM

## 2012-04-28 NOTE — Anesthesia Preprocedure Evaluation (Addendum)
Anesthesia Evaluation  Patient identified by MRN, date of birth, ID band Patient awake    Reviewed: Allergy & Precautions, H&P , NPO status , Patient's Chart, lab work & pertinent test results, reviewed documented beta blocker date and time   Airway Mallampati: II TM Distance: >3 FB Neck ROM: Full    Dental  (+) Caps and Dental Advisory Given   Pulmonary neg pulmonary ROS,  breath sounds clear to auscultation        Cardiovascular hypertension, Pt. on medications + angina + CAD Rhythm:Regular Rate:Normal     Neuro/Psych  Headaches, Headache R/T yellow jacket bee sting    GI/Hepatic negative GI ROS, Neg liver ROS,   Endo/Other  diabetes, Oral Hypoglycemic Agents  Renal/GU negative Renal ROS     Musculoskeletal negative musculoskeletal ROS (+)   Abdominal Normal abdominal exam  (+)   Peds  Hematology negative hematology ROS (+)   Anesthesia Other Findings   Reproductive/Obstetrics                           Anesthesia Physical Anesthesia Plan  ASA: III  Anesthesia Plan: General   Post-op Pain Management:    Induction: Intravenous  Airway Management Planned: Oral ETT  Additional Equipment: Arterial line, 3D TEE, CVP, PA Cath and Ultrasound Guidance Line Placement  Intra-op Plan:   Post-operative Plan: Post-operative intubation/ventilation  Informed Consent: I have reviewed the patients History and Physical, chart, labs and discussed the procedure including the risks, benefits and alternatives for the proposed anesthesia with the patient or authorized representative who has indicated his/her understanding and acceptance.   Dental advisory given  Plan Discussed with: Anesthesiologist, Surgeon and CRNA  Anesthesia Plan Comments:        Anesthesia Quick Evaluation

## 2012-04-28 NOTE — Progress Notes (Signed)
  Echocardiogram Echocardiogram Transesophageal has been performed.  Georgian Co 04/28/2012, 8:15 AM

## 2012-04-28 NOTE — Anesthesia Postprocedure Evaluation (Signed)
  Anesthesia Post-op Note  Patient: Mariusz Jubb  Procedure(s) Performed: Procedure(s) (LRB) with comments: CORONARY ARTERY BYPASS GRAFTING (CABG) (N/A) - Right saphenous vein used for bypass grafting. Five  bypasses performed including left mammary artery.   Patient Location: PACU and SICU  Anesthesia Type:General  Level of Consciousness: Patient remains intubated per anesthesia plan  Airway and Oxygen Therapy: Patient remains intubated per anesthesia plan  Post-op Pain: mild  Post-op Assessment: Post-op Vital signs reviewed  Post-op Vital Signs: Reviewed  Complications: No apparent anesthesia complications

## 2012-04-28 NOTE — Progress Notes (Signed)
The patient was examined and preop studies reviewed. There has been no change from the prior exam and the patient is ready for surgery.  Plan CABG this am on [name] [name]

## 2012-04-28 NOTE — OR Nursing (Signed)
Procedural Timeout Prior to leg incision completed  @ 0831, Leg incision made @ [phone]

## 2012-04-29 ENCOUNTER — Inpatient Hospital Stay (HOSPITAL_COMMUNITY): Payer: BC Managed Care – PPO

## 2012-04-29 DIAGNOSIS — I2510 Atherosclerotic heart disease of native coronary artery without angina pectoris: Secondary | ICD-10-CM

## 2012-04-29 LAB — BASIC METABOLIC PANEL
BUN: 17 mg/dL (ref 6–23)
CO2: 26 mEq/L (ref 19–32)
Calcium: 8.4 mg/dL (ref 8.4–10.5)
Chloride: 103 mEq/L (ref 96–112)
Creatinine, Ser: 1.11 mg/dL (ref 0.50–1.35)
GFR calc Af Amer: 82 mL/min — ABNORMAL LOW (ref >90)
GFR calc non Af Amer: 70 mL/min — ABNORMAL LOW (ref >90)
Glucose, Bld: 148 mg/dL — ABNORMAL HIGH (ref 70–99)
Potassium: 4.3 mEq/L (ref 3.5–5.1)
Sodium: 138 mEq/L (ref 135–145)

## 2012-04-29 LAB — GLUCOSE, CAPILLARY
Glucose-Capillary: 114 mg/dL — ABNORMAL HIGH (ref 70–99)
Glucose-Capillary: 120 mg/dL — ABNORMAL HIGH (ref 70–99)
Glucose-Capillary: 123 mg/dL — ABNORMAL HIGH (ref 70–99)
Glucose-Capillary: 124 mg/dL — ABNORMAL HIGH (ref 70–99)
Glucose-Capillary: 132 mg/dL — ABNORMAL HIGH (ref 70–99)
Glucose-Capillary: 132 mg/dL — ABNORMAL HIGH (ref 70–99)
Glucose-Capillary: 137 mg/dL — ABNORMAL HIGH (ref 70–99)
Glucose-Capillary: 142 mg/dL — ABNORMAL HIGH (ref 70–99)
Glucose-Capillary: 143 mg/dL — ABNORMAL HIGH (ref 70–99)
Glucose-Capillary: 143 mg/dL — ABNORMAL HIGH (ref 70–99)
Glucose-Capillary: 145 mg/dL — ABNORMAL HIGH (ref 70–99)
Glucose-Capillary: 147 mg/dL — ABNORMAL HIGH (ref 70–99)
Glucose-Capillary: 148 mg/dL — ABNORMAL HIGH (ref 70–99)
Glucose-Capillary: 149 mg/dL — ABNORMAL HIGH (ref 70–99)
Glucose-Capillary: 155 mg/dL — ABNORMAL HIGH (ref 70–99)
Glucose-Capillary: 162 mg/dL — ABNORMAL HIGH (ref 70–99)
Glucose-Capillary: 180 mg/dL — ABNORMAL HIGH (ref 70–99)
Glucose-Capillary: 92 mg/dL (ref 70–99)
Glucose-Capillary: 97 mg/dL (ref 70–99)

## 2012-04-29 LAB — CBC
HCT: 35.4 % — ABNORMAL LOW (ref 39.0–52.0)
HCT: 34.4 % — ABNORMAL LOW (ref 39.0–52.0)
Hemoglobin: 12 g/dL — ABNORMAL LOW (ref 13.0–17.0)
Hemoglobin: 11.6 g/dL — ABNORMAL LOW (ref 13.0–17.0)
MCH: 30.1 pg (ref 26.0–34.0)
MCH: 30.4 pg (ref 26.0–34.0)
MCHC: 33.9 g/dL (ref 30.0–36.0)
MCHC: 33.7 g/dL (ref 30.0–36.0)
MCV: 88.7 fL (ref 78.0–100.0)
MCV: 90.1 fL (ref 78.0–100.0)
Platelets: 196 10*3/uL (ref 150–400)
Platelets: 152 10*3/uL (ref 150–400)
RBC: 3.99 MIL/uL — ABNORMAL LOW (ref 4.22–5.81)
RBC: 3.82 MIL/uL — ABNORMAL LOW (ref 4.22–5.81)
RDW: 13.1 % (ref 11.5–15.5)
RDW: 13.4 % (ref 11.5–15.5)
WBC: 16.3 10*3/uL — ABNORMAL HIGH (ref 4.0–10.5)
WBC: 17.5 10*3/uL — ABNORMAL HIGH (ref 4.0–10.5)

## 2012-04-29 LAB — POCT I-STAT, CHEM 8
BUN: 23 mg/dL (ref 6–23)
Calcium, Ion: 1.21 mmol/L (ref 1.13–1.30)
Chloride: 99 mEq/L (ref 96–112)
Creatinine, Ser: 1.3 mg/dL (ref 0.50–1.35)
Glucose, Bld: 163 mg/dL — ABNORMAL HIGH (ref 70–99)
HCT: 34 % — ABNORMAL LOW (ref 39.0–52.0)
Hemoglobin: 11.6 g/dL — ABNORMAL LOW (ref 13.0–17.0)
Potassium: 4.1 mEq/L (ref 3.5–5.1)
Sodium: 135 mEq/L (ref 135–145)
TCO2: 24 mmol/L (ref 0–100)

## 2012-04-29 LAB — PREPARE PLATELET PHERESIS: Unit division: 0

## 2012-04-29 LAB — CREATININE, SERUM
Creatinine, Ser: 1.31 mg/dL (ref 0.50–1.35)
GFR calc Af Amer: 67 mL/min — ABNORMAL LOW (ref >90)
GFR calc non Af Amer: 58 mL/min — ABNORMAL LOW (ref >90)

## 2012-04-29 LAB — MAGNESIUM
Magnesium: 2.4 mg/dL (ref 1.5–2.5)
Magnesium: 2.5 mg/dL (ref 1.5–2.5)

## 2012-04-29 MED ORDER — INSULIN GLARGINE 100 UNIT/ML ~~LOC~~ SOLN
15.0000 [IU] | SUBCUTANEOUS | Status: DC
  Administered 2012-04-29 – 2012-05-01 (×3): 15 [IU] via SUBCUTANEOUS

## 2012-04-29 MED ORDER — AMIODARONE HCL 200 MG PO TABS
400.0000 mg | ORAL_TABLET | Freq: Two times a day (BID) | ORAL | Status: DC
  Administered 2012-04-29 – 2012-05-02 (×7): 400 mg via ORAL
  Filled 2012-04-29 (×9): qty 2

## 2012-04-29 MED ORDER — INSULIN ASPART 100 UNIT/ML ~~LOC~~ SOLN
0.0000 [IU] | SUBCUTANEOUS | Status: DC
  Administered 2012-04-29: 2 [IU] via SUBCUTANEOUS
  Administered 2012-04-29: 4 [IU] via SUBCUTANEOUS
  Administered 2012-04-29 – 2012-04-30 (×4): 2 [IU] via SUBCUTANEOUS

## 2012-04-29 MED ORDER — TRAMADOL HCL 50 MG PO TABS
50.0000 mg | ORAL_TABLET | Freq: Four times a day (QID) | ORAL | Status: DC | PRN
  Administered 2012-04-29 – 2012-05-03 (×4): 50 mg via ORAL
  Filled 2012-04-29 (×4): qty 1

## 2012-04-29 MED ORDER — METOPROLOL TARTRATE 25 MG PO TABS
25.0000 mg | ORAL_TABLET | Freq: Two times a day (BID) | ORAL | Status: DC
  Administered 2012-04-29 – 2012-04-30 (×3): 25 mg via ORAL
  Filled 2012-04-29 (×4): qty 1

## 2012-04-29 MED ORDER — FUROSEMIDE 10 MG/ML IJ SOLN
20.0000 mg | Freq: Every day | INTRAMUSCULAR | Status: DC
  Administered 2012-04-29 – 2012-04-30 (×2): 20 mg via INTRAVENOUS
  Filled 2012-04-29: qty 2

## 2012-04-29 MED ORDER — FUROSEMIDE 10 MG/ML IJ SOLN
40.0000 mg | Freq: Once | INTRAMUSCULAR | Status: AC
  Administered 2012-04-29: 40 mg via INTRAVENOUS
  Filled 2012-04-29: qty 4

## 2012-04-29 MED ORDER — CLOPIDOGREL BISULFATE 75 MG PO TABS
75.0000 mg | ORAL_TABLET | Freq: Every day | ORAL | Status: DC
  Administered 2012-04-30 – 2012-05-01 (×2): 75 mg via ORAL
  Filled 2012-04-29 (×3): qty 1

## 2012-04-29 MED ORDER — METOPROLOL TARTRATE 25 MG/10 ML ORAL SUSPENSION
12.5000 mg | Freq: Two times a day (BID) | ORAL | Status: DC
  Filled 2012-04-29 (×4): qty 5

## 2012-04-29 MED ORDER — ASPIRIN EC 81 MG PO TBEC
81.0000 mg | DELAYED_RELEASE_TABLET | Freq: Every day | ORAL | Status: DC
  Administered 2012-04-29 – 2012-05-04 (×6): 81 mg via ORAL
  Filled 2012-04-29 (×6): qty 1

## 2012-04-29 MED ORDER — LEVOFLOXACIN IN D5W 500 MG/100ML IV SOLN
500.0000 mg | Freq: Once | INTRAVENOUS | Status: AC
  Administered 2012-04-30: 500 mg via INTRAVENOUS
  Filled 2012-04-29: qty 100

## 2012-04-29 MED ORDER — AMIODARONE LOAD VIA INFUSION
150.0000 mg | Freq: Once | INTRAVENOUS | Status: AC
  Administered 2012-04-29: 150 mg via INTRAVENOUS
  Filled 2012-04-29: qty 83.34

## 2012-04-29 NOTE — Progress Notes (Signed)
1 Day Post-Op Procedure(s) (LRB): CORONARY ARTERY BYPASS GRAFTING (CABG) (N/A) Subjective: afib postop  Otherwise doing well  Objective: Vital signs in last 24 hours: Temp:  [97 F (36.1 C)-100 F (37.8 C)] 98.4 F (36.9 C) (11/16 0800) Pulse Rate:  [90-126] 91  (11/16 0800) Cardiac Rhythm:  [-] Atrial fibrillation (11/16 0800) Resp:  [10-18] 12  (11/16 0800) BP: (98-132)/(58-84) 108/62 mmHg (11/16 0800) SpO2:  [93 %-100 %] 97 % (11/16 0800) Arterial Line BP: (86-143)/(48-77) 110/59 mmHg (11/16 0800) FiO2 (%):  [40 %-70 %] 40 % (11/15 1815) Weight:  [246 lb 1.6 oz (111.63 kg)] 246 lb 1.6 oz (111.63 kg) (11/16 0500)  Hemodynamic parameters for last 24 hours: PAP: (16-32)/(7-26) 22/13 mmHg CO:  [3.9 L/min-6 L/min] 4.9 L/min CI:  [1.7 L/min/m2-2.5 L/min/m2] 2 L/min/m2  Intake/Output from previous day: 11/15 0701 - 11/16 0700 In: 5857.8 [P.O.:240; I.V.:3337.8; Blood:800; IV Piggyback:1480] Out: 30865 [Urine:6240; Blood:3130; Chest Tube:700] Intake/Output this shift: Total I/O In: -  Out: 20 [Urine:20]  Dec breath sounds  Lab Results:  Basename 04/29/12 0401 04/28/12 2145 04/28/12 2128  WBC 16.3* -- 15.2*  HGB 12.0* 10.9* --  HCT 35.4* 32.0* --  PLT 196 -- 170   BMET:  Basename 04/29/12 0401 04/28/12 2145 04/28/12 0540  NA 138 140 --  K 4.3 4.4 --  CL 103 104 --  CO2 26 -- 27  GLUCOSE 148* 143* --  BUN 17 15 --  CREATININE 1.11 1.10 --  CALCIUM 8.4 -- 9.4    PT/INR:  Basename 04/28/12 1551  LABPROT 16.8*  INR 1.40   ABG    Component Value Date/Time   PHART 7.350 04/28/2012 2017   HCO3 27.9* 04/28/2012 2017   TCO2 26 04/28/2012 2145   O2SAT 97.0 04/28/2012 2017   CBG (last 3)   Basename 04/29/12 0745 04/29/12 0637 04/29/12 0530  GLUCAP 123* 145* 132*    Assessment/Plan: S/P Procedure(s) (LRB): CORONARY ARTERY BYPASS GRAFTING (CABG) (N/A) See progression orders lantus + SSI for DM control   LOS: 3 days    VAN TRIGT  III,Renee Erb 04/29/2012

## 2012-04-29 NOTE — Progress Notes (Signed)
Patient converted to afib RVR, with a rate 90s-130s, around 2215. Spoke with Dr. Donata Clay. Received order for Amiodarone per protocol. Thresa Ross RN

## 2012-04-30 ENCOUNTER — Inpatient Hospital Stay (HOSPITAL_COMMUNITY): Payer: BC Managed Care – PPO

## 2012-04-30 LAB — BASIC METABOLIC PANEL
BUN: 28 mg/dL — ABNORMAL HIGH (ref 6–23)
CO2: 29 mEq/L (ref 19–32)
Calcium: 9.1 mg/dL (ref 8.4–10.5)
Chloride: 94 mEq/L — ABNORMAL LOW (ref 96–112)
Creatinine, Ser: 1.34 mg/dL (ref 0.50–1.35)
GFR calc Af Amer: 65 mL/min — ABNORMAL LOW (ref >90)
GFR calc non Af Amer: 56 mL/min — ABNORMAL LOW (ref >90)
Glucose, Bld: 184 mg/dL — ABNORMAL HIGH (ref 70–99)
Potassium: 4.2 mEq/L (ref 3.5–5.1)
Sodium: 133 mEq/L — ABNORMAL LOW (ref 135–145)

## 2012-04-30 LAB — GLUCOSE, CAPILLARY
Glucose-Capillary: 120 mg/dL — ABNORMAL HIGH (ref 70–99)
Glucose-Capillary: 125 mg/dL — ABNORMAL HIGH (ref 70–99)
Glucose-Capillary: 133 mg/dL — ABNORMAL HIGH (ref 70–99)
Glucose-Capillary: 135 mg/dL — ABNORMAL HIGH (ref 70–99)
Glucose-Capillary: 147 mg/dL — ABNORMAL HIGH (ref 70–99)
Glucose-Capillary: 163 mg/dL — ABNORMAL HIGH (ref 70–99)

## 2012-04-30 LAB — CBC
HCT: 33.3 % — ABNORMAL LOW (ref 39.0–52.0)
Hemoglobin: 11.1 g/dL — ABNORMAL LOW (ref 13.0–17.0)
MCH: 29.7 pg (ref 26.0–34.0)
MCHC: 33.3 g/dL (ref 30.0–36.0)
MCV: 89 fL (ref 78.0–100.0)
Platelets: 168 10*3/uL (ref 150–400)
RBC: 3.74 MIL/uL — ABNORMAL LOW (ref 4.22–5.81)
RDW: 13.4 % (ref 11.5–15.5)
WBC: 19 10*3/uL — ABNORMAL HIGH (ref 4.0–10.5)

## 2012-04-30 MED ORDER — WARFARIN SODIUM 5 MG IV SOLR
2.5000 mg | Freq: Every day | INTRAVENOUS | Status: DC
  Administered 2012-04-30: 2.5 mg via INTRAVENOUS
  Filled 2012-04-30 (×3): qty 1.25

## 2012-04-30 MED ORDER — SODIUM CHLORIDE 0.9 % IJ SOLN
3.0000 mL | Freq: Two times a day (BID) | INTRAMUSCULAR | Status: DC
  Administered 2012-04-30 – 2012-05-03 (×6): 3 mL via INTRAVENOUS

## 2012-04-30 MED ORDER — SIMVASTATIN 20 MG PO TABS
20.0000 mg | ORAL_TABLET | Freq: Every day | ORAL | Status: DC
  Administered 2012-04-30 – 2012-05-01 (×2): 20 mg via ORAL
  Filled 2012-04-30 (×4): qty 1

## 2012-04-30 MED ORDER — SODIUM CHLORIDE 0.9 % IV SOLN: 250.0000 mL | INTRAVENOUS | Status: DC | PRN

## 2012-04-30 MED ORDER — METOPROLOL TARTRATE 50 MG PO TABS
50.0000 mg | ORAL_TABLET | Freq: Two times a day (BID) | ORAL | Status: DC
  Administered 2012-04-30: 50 mg via ORAL
  Filled 2012-04-30 (×3): qty 1

## 2012-04-30 MED ORDER — MAGNESIUM HYDROXIDE 400 MG/5ML PO SUSP: 30.0000 mL | Freq: Every day | ORAL | Status: DC | PRN

## 2012-04-30 MED ORDER — BISACODYL 5 MG PO TBEC
10.0000 mg | DELAYED_RELEASE_TABLET | Freq: Every day | ORAL | Status: DC | PRN
  Filled 2012-04-30: qty 2

## 2012-04-30 MED ORDER — ALBUMIN HUMAN 5 % IV SOLN
INTRAVENOUS | Status: DC | PRN
  Administered 2012-04-28: 15:00:00 via INTRAVENOUS

## 2012-04-30 MED ORDER — POTASSIUM CHLORIDE CRYS ER 20 MEQ PO TBCR
20.0000 meq | EXTENDED_RELEASE_TABLET | Freq: Two times a day (BID) | ORAL | Status: DC
  Administered 2012-04-30 – 2012-05-04 (×9): 20 meq via ORAL
  Filled 2012-04-30 (×10): qty 1

## 2012-04-30 MED ORDER — DOCUSATE SODIUM 100 MG PO CAPS
200.0000 mg | ORAL_CAPSULE | Freq: Every day | ORAL | Status: DC
  Administered 2012-04-30 – 2012-05-04 (×5): 200 mg via ORAL
  Filled 2012-04-30 (×5): qty 2

## 2012-04-30 MED ORDER — FUROSEMIDE 10 MG/ML IJ SOLN
INTRAMUSCULAR | Status: AC
  Filled 2012-04-30: qty 4

## 2012-04-30 MED ORDER — WARFARIN - PHYSICIAN DOSING INPATIENT
Freq: Every day | Status: DC
  Administered 2012-05-01: 18:00:00

## 2012-04-30 MED ORDER — MOVING RIGHT ALONG BOOK
Freq: Once | Status: AC
  Administered 2012-04-30: 15:00:00
  Filled 2012-04-30: qty 1

## 2012-04-30 MED ORDER — FUROSEMIDE 10 MG/ML IJ SOLN
40.0000 mg | Freq: Once | INTRAMUSCULAR | Status: AC
  Administered 2012-04-30: 40 mg via INTRAVENOUS

## 2012-04-30 MED ORDER — AMIODARONE IV BOLUS ONLY 150 MG/100ML: 150.0000 mg | Freq: Once | INTRAVENOUS | Status: AC

## 2012-04-30 MED ORDER — ONDANSETRON HCL 4 MG/2ML IJ SOLN: 4.0000 mg | Freq: Four times a day (QID) | INTRAMUSCULAR | Status: DC | PRN

## 2012-04-30 MED ORDER — BISACODYL 10 MG RE SUPP: 10.0000 mg | Freq: Every day | RECTAL | Status: DC | PRN

## 2012-04-30 MED ORDER — INSULIN ASPART 100 UNIT/ML ~~LOC~~ SOLN
0.0000 [IU] | Freq: Three times a day (TID) | SUBCUTANEOUS | Status: DC
  Administered 2012-04-30 (×2): 2 [IU] via SUBCUTANEOUS
  Administered 2012-05-01: 4 [IU] via SUBCUTANEOUS
  Administered 2012-05-01 – 2012-05-04 (×8): 2 [IU] via SUBCUTANEOUS

## 2012-04-30 MED ORDER — ONDANSETRON HCL 4 MG PO TABS: 4.0000 mg | ORAL_TABLET | Freq: Four times a day (QID) | ORAL | Status: DC | PRN

## 2012-04-30 MED ORDER — METOPROLOL TARTRATE 25 MG/10 ML ORAL SUSPENSION
12.5000 mg | Freq: Two times a day (BID) | ORAL | Status: DC
  Filled 2012-04-30: qty 5

## 2012-04-30 MED ORDER — AMIODARONE HCL IN DEXTROSE 360-4.14 MG/200ML-% IV SOLN
INTRAVENOUS | Status: AC
  Administered 2012-04-30: 150 mg
  Filled 2012-04-30: qty 200

## 2012-04-30 MED ORDER — SODIUM CHLORIDE 0.9 % IJ SOLN: 3.0000 mL | INTRAMUSCULAR | Status: DC | PRN

## 2012-04-30 NOTE — Op Note (Signed)
NAME:  [name] [name], [name] [name] NO.:  [id number]  MEDICAL RECORD NO.:  [id number]  LOCATION:  2302                         FACILITY:  [facility]  PHYSICIAN:  [name], M.D.  DATE OF BIRTH:  1950/12/26  DATE OF PROCEDURE:  04/28/2012 DATE OF DISCHARGE:                              OPERATIVE REPORT   OPERATION: 1. Coronary artery bypass grafting x5 (left internal mammary artery to     intramyocardial LAD, saphenous vein graft to 2nd diagonal,     sequential saphenous vein graft to posterior descending and     posterior lateral branch of right coronary, saphenous vein graft to     obtuse marginal). 2. Endoscopic harvest of right leg greater saphenous vein, endoscopic     exposure but not harvest of left leg small saphenous vein.  PREOPERATIVE DIAGNOSIS:  Severe 3-vessel coronary artery disease with unstable angina.  POSTOPERATIVE DIAGNOSIS:  Severe 3-vessel coronary artery disease with unstable angina.  SURGEON:  [name], MD  ASSISTANT:  Jari Pigg, PA-C  ANESTHESIA:  General by Dr. Judie Petit.  INDICATIONS:  The patient is a 61 year old gentleman who presented to the hospital with symptoms of unstable angina and negative cardiac enzymes.  A cardiac CT scan showed him high risk for CAD and he underwent cardiac catheterization demonstrating high-grade stenosis of his LAD right coronary and moderate stenosis of his diagonal in the distal left coronary circumflex distribution.  His EF was fairly well preserved.  He is felt to be candidate for surgical revascularization. He did have significant varicosities on the lateral aspect of his right leg.  I discussed the indications, benefits, and risks of coronary artery bypass grafting for treatment of his coronary artery disease.  I discussed the details of the operation including the location of the surgical incisions, use of general anesthesia and cardiopulmonary bypass, and expected postoperative  hospital recovery.  I discussed with him the risks to him including the risks of MI, stroke, bleeding, infection, and death.  He demonstrated his understanding of these issues and agreed to proceed with surgery under what I felt was an informed consent.  OPERATIVE FINDINGS: 1. Deeply intramyocardial LAD, probably not a graftable target in a     redo setting. 2. Small targets in the circumflex and diagonal distribution, probably     nongraftable targets in a redo setting. 3. No adequate saphenous vein localized in the left leg, vein harvest     from right leg was adequate. 4. Good quality internal mammary artery with excellent flow.  OPERATIVE PROCEDURE:  The patient was brought to the operating room and placed supine on the operating table.  General anesthesia was induced under invasive hemodynamic monitoring.  A transesophageal echo probe was placed by the anesthesiologist.  This confirmed the preoperative diagnosis of fairly well-preserved LV function without significant valvular disease.  A sternal incision was made as the saphenous vein was harvested endoscopically from the right leg.  The left leg vein was exposed but was too small and was not used.  After the internal mammary artery was harvested, the sternal retractor was placed and the pericardium was opened and suspended.  Pursestrings were placed in the ascending  aorta and right atrium and heparin was administered.  The ACT was documented as being therapeutic.  The patient was cannulated and placed on cardiopulmonary bypass.  The coronary arteries were identified for grafting.  The mammary artery and vein grafts were prepared for the distal anastomoses.  Cardioplegic catheters were placed for both antegrade and retrograde cold blood cardioplegia.  The patient was cooled to 32 degrees.  The aortic crossclamp was applied.  One liter of cold blood cardioplegia was used to produce a satisfactory cardioplegic arrest with septal  temperature dropping less than 15 degrees. Cardioplegia was delivered every 20 minutes or less while the crossclamp was in place.  The distal coronary anastomoses were then performed.  The first distal anastomosis was the posterior descending branch of the right coronary as part of the sequential graft to the posterior descending and posterolateral.  The posterior descending was 1.5-mm vessel, proximal 80% stenosis.  A side-to-side anastomosis with the reverse saphenous vein was constructed using running 7-0 Prolene with good flow through the graft.  The second distal anastomosis was the continuation of the sequential vein graft to the posterolateral.  This was a 1.5-mm vessel with proximal 80-90% stenosis.  The end of the vein was sewn end-to-side with running 7-0 Prolene with good flow through graft.  Cardioplegia was redosed.  The third distal anastomosis was the OM branch of the circumflex.  This was a small 1 mm vessel with 80% proximal stenosis. An end-to-side anastomosis with a reverse saphenous vein was sewn using running 8-0 Prolene.  There was adequate flow through graft.  The fourth distal anastomosis was to the diagonal branch of the LAD.  This was a 1.2-mm vessel, proximal 90% stenosis.  A reverse saphenous vein was sewn end-to-side with running 7-0 Prolene with good flow through the graft. Cardioplegia was redosed.  The 5th distal anastomosis was to the mid to distal third of the LAD.  It was deeply intramyocardial in the septum and required extensive dissection for location.  The mammary artery was brought through an opening in the pericardium and was brought down onto the LAD and sewn end-to-side with running 8-0 Prolene.  There was excellent flow through the anastomosis after briefly releasing the pedicle bulldog on the mammary artery.  The bulldog was reapplied and pedicle was secured at the epicardium.  Cardioplegia was redosed.  With the crossclamp was still in  place, 3 proximal vein anastomoses were performed on the ascending aorta using a 4.0 mm punch, running 7-0 Prolene.  Prior to tying down the final proximal anastomosis, air was vented from the coronaries with a dose of retrograde warm blood cardioplegia and the crossclamp was removed.  The vein grafts were de-aired and opened and each had good flow. Hemostasis was documented in the proximal distal site.  The heart was cardioverted back to a regular rhythm.  Temporary pacing wires were applied.  The patient was rewarmed and reperfused.  The lungs re- expanded.  The ventilator was resumed.  The patient was rewarmed and reperfused, was weaned off bypass on renal dose of dopamine.  Echo showed good LV function.  Hemodynamics were stable and cardiac output was 5 L.  Protamine was administered and the cannulas were removed.  The mediastinum was irrigated with warm saline.  The leg incisions were irrigated and closed in a standard fashion.  Superior pericardial fat was closed over the aorta.  The anterior mediastinal and left pleural chest tube were placed and brought through separate incisions.  The sternum  was closed with wire.  The pectoralis fascia was closed with running #1 Vicryl.  The subcutaneous and skin layers were closed in running Vicryl and sterile dressings were applied.  Total cardiopulmonary bypass time was 200 minutes.     [name], M.D.     PV/MEDQ  D:  04/29/2012  T:  04/30/2012  Job:  [id number]  cc:   Marca Ancona, MD

## 2012-04-30 NOTE — Addendum Note (Signed)
Addendum  created 04/30/12 4098 by Hessie Dibble, CRNA   Modules edited:Anesthesia Medication Administration

## 2012-04-30 NOTE — Plan of Care (Signed)
Problem: Phase III Progression Outcomes Goal: Time patient transferred to PCTU/Telemetry POD Outcome: Completed/Met Date Met:  04/30/12 1400

## 2012-04-30 NOTE — Progress Notes (Signed)
2 Days Post-Op Procedure(s) (LRB): CORONARY ARTERY BYPASS GRAFTING (CABG) (N/A) Subjective: CABG unstable angina Postop afib on amio,coumadin  Objective: Vital signs in last 24 hours: Temp:  [97.5 F (36.4 C)-98.2 F (36.8 C)] 97.7 F (36.5 C) (11/17 0734) Pulse Rate:  [57-99] 74  (11/17 0700) Cardiac Rhythm:  [-] Normal sinus rhythm (11/17 0700) Resp:  [10-21] 18  (11/17 0700) BP: (97-150)/(66-88) 150/79 mmHg (11/17 0700) SpO2:  [91 %-100 %] 99 % (11/17 0700) Arterial Line BP: (121-125)/(64-77) 125/64 mmHg (11/16 1200) Weight:  [244 lb 12.8 oz (111.041 kg)] 244 lb 12.8 oz (111.041 kg) (11/17 0330)  Hemodynamic parameters for last 24 hours:  stable  Intake/Output from previous day: 11/16 0701 - 11/17 0700 In: 2383.3 [P.O.:1380; I.V.:901.3; IV Piggyback:102] Out: 1625 [Urine:1415; Chest Tube:210] Intake/Output this shift:    Neuro intact, mild edema  Lab Results:  Basename 04/30/12 0400 04/29/12 1653 04/29/12 1548  WBC 19.0* -- 17.5*  HGB 11.1* 11.6* --  HCT 33.3* 34.0* --  PLT 168 -- 152   BMET:  Basename 04/30/12 0400 04/29/12 1653 04/29/12 0401  NA 133* 135 --  K 4.2 4.1 --  CL 94* 99 --  CO2 29 -- 26  GLUCOSE 184* 163* --  BUN 28* 23 --  CREATININE 1.34 1.30 --  CALCIUM 9.1 -- 8.4    PT/INR:  Basename 04/28/12 1551  LABPROT 16.8*  INR 1.40   ABG    Component Value Date/Time   PHART 7.350 04/28/2012 2017   HCO3 27.9* 04/28/2012 2017   TCO2 24 04/29/2012 1653   O2SAT 97.0 04/28/2012 2017   CBG (last 3)   Basename 04/30/12 0731 04/30/12 0341 04/29/12 2332  GLUCAP 125* 147* 135*    Assessment/Plan: S/P Procedure(s) (LRB): CORONARY ARTERY BYPASS GRAFTING (CABG) (N/A) Plan for transfer to step-down: see transfer orders plavix for 4 wks with acute cor syndrome Start po metformin when BUN better  LOS: 4 days    [name],[name] 04/30/2012

## 2012-05-01 ENCOUNTER — Encounter (HOSPITAL_COMMUNITY): Payer: Self-pay | Admitting: Cardiothoracic Surgery

## 2012-05-01 ENCOUNTER — Inpatient Hospital Stay (HOSPITAL_COMMUNITY): Payer: BC Managed Care – PPO

## 2012-05-01 DIAGNOSIS — I4891 Unspecified atrial fibrillation: Secondary | ICD-10-CM

## 2012-05-01 DIAGNOSIS — I5031 Acute diastolic (congestive) heart failure: Secondary | ICD-10-CM

## 2012-05-01 DIAGNOSIS — I480 Paroxysmal atrial fibrillation: Secondary | ICD-10-CM

## 2012-05-01 DIAGNOSIS — I509 Heart failure, unspecified: Secondary | ICD-10-CM

## 2012-05-01 LAB — BASIC METABOLIC PANEL
BUN: 36 mg/dL — ABNORMAL HIGH (ref 6–23)
CO2: 33 mEq/L — ABNORMAL HIGH (ref 19–32)
Calcium: 8.8 mg/dL (ref 8.4–10.5)
Chloride: 92 mEq/L — ABNORMAL LOW (ref 96–112)
Creatinine, Ser: 1.38 mg/dL — ABNORMAL HIGH (ref 0.50–1.35)
GFR calc Af Amer: 63 mL/min — ABNORMAL LOW (ref >90)
GFR calc non Af Amer: 54 mL/min — ABNORMAL LOW (ref >90)
Glucose, Bld: 147 mg/dL — ABNORMAL HIGH (ref 70–99)
Potassium: 4.8 mEq/L (ref 3.5–5.1)
Sodium: 131 mEq/L — ABNORMAL LOW (ref 135–145)

## 2012-05-01 LAB — TYPE AND SCREEN
ABO/RH(D): A POS
Antibody Screen: NEGATIVE
Unit division: 0
Unit division: 0

## 2012-05-01 LAB — CBC
HCT: 29.5 % — ABNORMAL LOW (ref 39.0–52.0)
Hemoglobin: 10.3 g/dL — ABNORMAL LOW (ref 13.0–17.0)
MCH: 31.1 pg (ref 26.0–34.0)
MCHC: 34.9 g/dL (ref 30.0–36.0)
MCV: 89.1 fL (ref 78.0–100.0)
Platelets: 134 10*3/uL — ABNORMAL LOW (ref 150–400)
RBC: 3.31 MIL/uL — ABNORMAL LOW (ref 4.22–5.81)
RDW: 13.1 % (ref 11.5–15.5)
WBC: 13.2 10*3/uL — ABNORMAL HIGH (ref 4.0–10.5)

## 2012-05-01 LAB — GLUCOSE, CAPILLARY
Glucose-Capillary: 132 mg/dL — ABNORMAL HIGH (ref 70–99)
Glucose-Capillary: 145 mg/dL — ABNORMAL HIGH (ref 70–99)
Glucose-Capillary: 144 mg/dL — ABNORMAL HIGH (ref 70–99)
Glucose-Capillary: 183 mg/dL — ABNORMAL HIGH (ref 70–99)

## 2012-05-01 LAB — PROTIME-INR
INR: 1.43 (ref 0.00–1.49)
Prothrombin Time: 17.1 seconds — ABNORMAL HIGH (ref 11.6–15.2)

## 2012-05-01 MED ORDER — FUROSEMIDE 10 MG/ML IJ SOLN
40.0000 mg | Freq: Once | INTRAMUSCULAR | Status: AC
  Administered 2012-05-01: 40 mg via INTRAVENOUS
  Filled 2012-05-01 (×2): qty 4

## 2012-05-01 MED ORDER — GLIPIZIDE ER 5 MG PO TB24
5.0000 mg | ORAL_TABLET | Freq: Every day | ORAL | Status: DC
  Administered 2012-05-02 – 2012-05-04 (×3): 5 mg via ORAL
  Filled 2012-05-01 (×4): qty 1

## 2012-05-01 MED ORDER — WARFARIN SODIUM 2.5 MG PO TABS
2.5000 mg | ORAL_TABLET | Freq: Every day | ORAL | Status: DC
  Administered 2012-05-01 – 2012-05-02 (×2): 2.5 mg via ORAL
  Filled 2012-05-01 (×3): qty 1

## 2012-05-01 MED ORDER — FUROSEMIDE 10 MG/ML IJ SOLN: 40.0000 mg | Freq: Two times a day (BID) | INTRAMUSCULAR | Status: DC

## 2012-05-01 MED ORDER — METOPROLOL SUCCINATE ER 50 MG PO TB24
50.0000 mg | ORAL_TABLET | Freq: Every day | ORAL | Status: DC
  Administered 2012-05-01: 50 mg via ORAL
  Filled 2012-05-01 (×2): qty 1

## 2012-05-01 NOTE — Care Management Note (Signed)
    Page 1 of 1   05/01/2012     3:37:15 PM   CARE MANAGEMENT NOTE 05/01/2012  Patient:  [name] [name], [name] [name]   Account Number:  [id number]  Date Initiated:  05/01/2012  Documentation initiated by:  Briauna Gilmartin  Subjective/Objective Assessment:   PT S/P CABG X 5 ON 04/28/12.  PTA, PT INDEPENDENT, LIVES WITH SPOUSE.     Action/Plan:   MET WITH PT TO DISCUSS DC PLANS.  PT REQUESTS TALL RW FOR HOME.  PT STATES WIFE TO PROVIDE 24HR CARE AT DC.  DENIES ADDITIONAL NEEDS.   Anticipated DC Date:  05/02/2012   Anticipated DC Plan:  HOME/SELF CARE      DC Planning Services  CM consult      Choice offered to / List presented to:     DME arranged  Bl[facility]      DME agency  [facility]        Status of service:  Completed, signed off Medicare Important Message given?   (If response is "NO", the following Medicare IM given date fields will be blank) Date Medicare IM given:   Date Additional Medicare IM given:    Discharge Disposition:  HOME/SELF CARE  Per UR Regulation:  Reviewed for med. necessity/level of care/duration of stay  If discussed at Long Length of Stay Meetings, dates discussed:    Comments:

## 2012-05-01 NOTE — Progress Notes (Signed)
Pt ambulated with RN in hall on room air 150 ft. O2 sat 90%. Pt tolerated well but did state he was surprised at how easily he got tired.

## 2012-05-01 NOTE — Progress Notes (Addendum)
                    [address]            [place] [address]          [phone]     3 Days Post-Op Procedure(s) (LRB): CORONARY ARTERY BYPASS GRAFTING (CABG) (N/A)  Subjective: Feels well.  Sore and complaining of hiccups.   Objective: Vital signs in last 24 hours: Patient Vitals for the past 24 hrs:  BP Temp Temp src Pulse Resp SpO2 Weight  05/01/12 0436 119/75 mmHg 98 F (36.7 C) Oral 66  18  93 % 242 lb 1.6 oz (109.816 kg)  04/30/12 2008 109/73 mmHg 98.3 F (36.8 C) Oral 103  17  92 % -  04/30/12 1419 118/72 mmHg 97.8 F (36.6 C) - 110  18  96 % -  04/30/12 1400 - - - 104  14  96 % -  04/30/12 1300 - - - - 21  - -  04/30/12 1200 131/83 mmHg - - - 23  96 % -  04/30/12 1131 - 97.9 F (36.6 C) Oral - - - -  04/30/12 1100 135/88 mmHg - - - 17  96 % -  04/30/12 1000 - - - 95  18  97 % -  04/30/12 0900 141/78 mmHg - - 101  20  96 % -   Current Weight  05/01/12 242 lb 1.6 oz (109.816 kg)   PRE-OPERATIVE WEIGHT: 108 kg   Intake/Output from previous day: 11/17 0701 - 11/18 0700 In: 1189.1 [P.O.:840; I.V.:349.1] Out: 2525 [Urine:2475; Chest Tube:50]  CBGs 133-120-132-147  PHYSICAL EXAM:  Heart: Irr Irr Lungs: Clear Wound: Clean and dry Extremities: Trace LE edema    Lab Results: CBC: Basename 05/01/12 0615 04/30/12 0400  WBC 13.2* 19.0*  HGB 10.3* 11.1*  HCT 29.5* 33.3*  PLT 134* 168   BMET:  Basename 05/01/12 0615 04/30/12 0400  NA 131* 133*  K 4.8 4.2  CL 92* 94*  CO2 33* 29  GLUCOSE 147* 184*  BUN 36* 28*  CREATININE 1.38* 1.34  CALCIUM 8.8 9.1    PT/INR:  Basename 05/01/12 0615  LABPROT 17.1*  INR 1.43      Assessment/Plan: S/P Procedure(s) (LRB): CORONARY ARTERY BYPASS GRAFTING (CABG) (N/A)  CV- back in rate controlled AF this am.  Will continue po Amio, Coumadin, Lopressor.  May need to increase Lopressor further if BPs remain stable.  Vol overload- diurese.  DM- A1C=6.1.  Pt not on meds prior to surgery.   Continue low dose Lantus. May need oral agent for home.  ARI- Cr stable.  Monitor.  CRPI, pulm toilet.  Hopefully home soon if remains stable.   LOS: 5 days    COLLINS,GINA H 05/01/2012   patient examined and medical record reviewed,agree with above note. VAN TRIGT III,PETER 05/01/2012

## 2012-05-01 NOTE — Progress Notes (Signed)
  Echocardiogram 2D Echocardiogram has been performed.  Georgian Co 05/01/2012, 4:38 PM

## 2012-05-01 NOTE — Progress Notes (Signed)
Patient ID: [name] [name], male   DOB: 15-Jan-1951, 61 y.o.   MRN: [id number]    SUBJECTIVE: Chest is sore.  No dyspnea.  Has gone into atrial fibrillation.    Filed Vitals:   04/30/12 1400 04/30/12 1419 04/30/12 2008 05/01/12 0436  BP:  118/72 109/73 119/75  Pulse: 104 110 103 66  Temp:  97.8 F (36.6 C) 98.3 F (36.8 C) 98 F (36.7 C)  TempSrc:   Oral Oral  Resp: 14 18 17 18   Height:      Weight:    242 lb 1.6 oz (109.816 kg)  SpO2: 96% 96% 92% 93%    Intake/Output Summary (Last 24 hours) at 05/01/12 0826 Last data filed at 05/01/12 0700  Gross per 24 hour  Intake 1069.14 ml  Output   2450 ml  Net -1380.86 ml    LABS: Basic Metabolic Panel:  Basename 05/01/12 0615 04/30/12 0400 04/29/12 1548 04/29/12 0401  NA 131* 133* -- --  K 4.8 4.2 -- --  CL 92* 94* -- --  CO2 33* 29 -- --  GLUCOSE 147* 184* -- --  BUN 36* 28* -- --  CREATININE 1.38* 1.34 -- --  CALCIUM 8.8 9.1 -- --  MG -- -- 2.5 2.4  PHOS -- -- -- --   Liver Function Tests: No results found for this basename: AST:2,ALT:2,ALKPHOS:2,BILITOT:2,PROT:2,ALBUMIN:2 in the last 72 hours No results found for this basename: LIPASE:2,AMYLASE:2 in the last 72 hours CBC:  Basename 05/01/12 0615 04/30/12 0400  WBC 13.2* 19.0*  NEUTROABS -- --  HGB 10.3* 11.1*  HCT 29.5* 33.3*  MCV 89.1 89.0  PLT 134* 168   Cardiac Enzymes: No results found for this basename: CKTOTAL:3,CKMB:3,CKMBINDEX:3,TROPONINI:3 in the last 72 hours BNP: No components found with this basename: POCBNP:3 D-Dimer: No results found for this basename: DDIMER:2 in the last 72 hours Hemoglobin A1C: No results found for this basename: HGBA1C in the last 72 hours Fasting Lipid Panel: No results found for this basename: CHOL,HDL,LDLCALC,TRIG,CHOLHDL,LDLDIRECT in the last 72 hours Thyroid Function Tests: No results found for this basename: TSH,T4TOTAL,FREET3,T3FREE,THYROIDAB in the last 72 hours Anemia Panel: No results found for this  basename: VITAMINB12,FOLATE,FERRITIN,TIBC,IRON,RETICCTPCT in the last 72 hours  RADIOLOGY: Dg Chest Portable 1 View In Am  04/30/2012  *RADIOLOGY REPORT*  Clinical Data: Post CABG.  PORTABLE CHEST - 1 VIEW  Comparison: 04/29/2012  Findings: The Swan-Ganz catheter has been removed.  The mediastinal drain has been removed.  Left chest tube is still present.  Patchy densities in the left mid and lower lung are unchanged and most likely associated with volume loss.  There is no definite pneumothorax.  Right jugular central line is still present.  Stable appearance of the heart.  IMPRESSION: Removal of support apparatuses as described.  No definite pneumothorax.  Persistent patchy densities at the left lung base.   Original Report Authenticated By: Richarda Overlie, M.D.    PHYSICAL EXAM General: NAD Neck: JVP 10 cm, no thyromegaly or thyroid nodule.  Lungs: Clear to auscultation bilaterally with normal respiratory effort. CV: Nondisplaced PMI.  Heart irregular S1/S2, no S3/S4, no murmur.  1+ right ankle edema.  No carotid bruit.    Abdomen: Soft, nontender, no hepatosplenomegaly, no distention.  Neurologic: Alert and oriented x 3.  Psych: Normal affect. Extremities: No clubbing or cyanosis.   TELEMETRY: Reviewed telemetry pt in atrial fibrillation in the 80s  ASSESSMENT AND PLAN: 61 yo with history of CAD s/p NSTEMI now with CABG.  He has developed  post-op atrial fibrillation. 1. CAD: s/p CABG.  He is now on ASA 81, Plavix, and warfarin since atrial fibrillation has developed.  Would recommend taking him off Plavix since he will be on warfarin for at least some time post-operatively.  Can resume Plavix in the future when he comes off warfarin.  2. Atrial fibrillation: Post-op.  He has started on warfarin and amiodarone.  Would transition metoprolol to more long-acting Toprol XL.  If he ends up needing long-term anticoagulation (does not come out of atrial fibrillation quickly), would consider using  Eliquis rather than warfarin.  3. Acute diastolic CHF: Post-op volume overload.  BUN higher but he does have fluid on board.  Will give a dose of Lasix 40 mg IV this morning and follow volume/creatinine.  Will get echocardiogram.   Marca Ancona 05/01/2012 8:30 AM

## 2012-05-01 NOTE — Progress Notes (Signed)
CARDIAC REHAB PHASE I   PRE:  Rate/Rhythm: 85-97afib  BP:  Supine:   Sitting: 121/82  Standing:    SaO2: 88-91%RA improved with sitting on side of bed and deep breathing  MODE:  Ambulation: 550  ft   POST:  Rate/Rhythem: 109afib  BP:  Supine:   Sitting: 123/72  Standing:    SaO2: 95%hall and room on RA [id number] Pt walked 550 ft on RA with asst x 1. Did not want to use walker. Loss of balance once. Denied dizziness. Slow pace and small steps. To recliner after walk. Sats better with activity. Encouraged IS and pursed-lip breathing. Call bell in reach. C/o shoulder discomfort.  Duanne Limerick

## 2012-05-02 LAB — BASIC METABOLIC PANEL
BUN: 29 mg/dL — ABNORMAL HIGH (ref 6–23)
CO2: 34 mEq/L — ABNORMAL HIGH (ref 19–32)
Calcium: 8.9 mg/dL (ref 8.4–10.5)
Chloride: 94 mEq/L — ABNORMAL LOW (ref 96–112)
Creatinine, Ser: 1.11 mg/dL (ref 0.50–1.35)
GFR calc Af Amer: 82 mL/min — ABNORMAL LOW (ref >90)
GFR calc non Af Amer: 70 mL/min — ABNORMAL LOW (ref >90)
Glucose, Bld: 121 mg/dL — ABNORMAL HIGH (ref 70–99)
Potassium: 4 mEq/L (ref 3.5–5.1)
Sodium: 134 mEq/L — ABNORMAL LOW (ref 135–145)

## 2012-05-02 LAB — GLUCOSE, CAPILLARY
Glucose-Capillary: 132 mg/dL — ABNORMAL HIGH (ref 70–99)
Glucose-Capillary: 136 mg/dL — ABNORMAL HIGH (ref 70–99)
Glucose-Capillary: 117 mg/dL — ABNORMAL HIGH (ref 70–99)
Glucose-Capillary: 114 mg/dL — ABNORMAL HIGH (ref 70–99)

## 2012-05-02 LAB — PROTIME-INR
INR: 1.31 (ref 0.00–1.49)
Prothrombin Time: 16 seconds — ABNORMAL HIGH (ref 11.6–15.2)

## 2012-05-02 MED ORDER — METOPROLOL SUCCINATE ER 25 MG PO TB24: 75.0000 mg | ORAL_TABLET | Freq: Every day | ORAL | Status: DC

## 2012-05-02 MED ORDER — ATORVASTATIN CALCIUM 80 MG PO TABS
80.0000 mg | ORAL_TABLET | Freq: Every day | ORAL | Status: DC
  Administered 2012-05-02 – 2012-05-03 (×2): 80 mg via ORAL
  Filled 2012-05-02 (×4): qty 1

## 2012-05-02 MED ORDER — WARFARIN SODIUM 2.5 MG PO TABS: 2.5000 mg | ORAL_TABLET | Freq: Every day | ORAL | Status: DC

## 2012-05-02 MED ORDER — FUROSEMIDE 10 MG/ML IJ SOLN
40.0000 mg | Freq: Once | INTRAMUSCULAR | Status: AC
  Administered 2012-05-02: 40 mg via INTRAVENOUS
  Filled 2012-05-02: qty 4

## 2012-05-02 MED ORDER — AMIODARONE HCL 400 MG PO TABS: 400.0000 mg | ORAL_TABLET | Freq: Two times a day (BID) | ORAL | Status: DC

## 2012-05-02 MED ORDER — OXYCODONE HCL 5 MG PO TABS: 5.0000 mg | ORAL_TABLET | ORAL | Status: DC | PRN

## 2012-05-02 MED ORDER — POTASSIUM CHLORIDE CRYS ER 20 MEQ PO TBCR
40.0000 meq | EXTENDED_RELEASE_TABLET | Freq: Once | ORAL | Status: AC
  Administered 2012-05-02: 40 meq via ORAL
  Filled 2012-05-02: qty 2

## 2012-05-02 MED ORDER — GLIPIZIDE ER 5 MG PO TB24: 5.0000 mg | ORAL_TABLET | Freq: Every day | ORAL | Status: DC

## 2012-05-02 MED ORDER — METOPROLOL SUCCINATE ER 25 MG PO TB24
75.0000 mg | ORAL_TABLET | Freq: Every day | ORAL | Status: DC
  Administered 2012-05-02 – 2012-05-04 (×3): 75 mg via ORAL
  Filled 2012-05-02 (×4): qty 1

## 2012-05-02 MED ORDER — POTASSIUM CHLORIDE 20 MEQ PO PACK: 40.0000 meq | PACK | Freq: Once | ORAL | Status: DC

## 2012-05-02 MED ORDER — ATORVASTATIN CALCIUM 80 MG PO TABS: 80.0000 mg | ORAL_TABLET | Freq: Every day | ORAL | Status: DC

## 2012-05-02 MED FILL — Sodium Chloride IV Soln 0.9%: INTRAVENOUS | Qty: 1000 | Status: AC

## 2012-05-02 MED FILL — Sodium Bicarbonate IV Soln 8.4%: INTRAVENOUS | Qty: 50 | Status: AC

## 2012-05-02 MED FILL — Magnesium Sulfate Inj 50%: INTRAMUSCULAR | Qty: 10 | Status: AC

## 2012-05-02 MED FILL — Lidocaine HCl IV Inj 20 MG/ML: INTRAVENOUS | Qty: 5 | Status: AC

## 2012-05-02 MED FILL — Electrolyte-R (PH 7.4) Solution: INTRAVENOUS | Qty: 5000 | Status: AC

## 2012-05-02 MED FILL — Heparin Sodium (Porcine) Inj 1000 Unit/ML: INTRAMUSCULAR | Qty: 30 | Status: AC

## 2012-05-02 MED FILL — Mannitol IV Soln 20%: INTRAVENOUS | Qty: 500 | Status: AC

## 2012-05-02 MED FILL — Heparin Sodium (Porcine) Inj 1000 Unit/ML: INTRAMUSCULAR | Qty: 10 | Status: AC

## 2012-05-02 MED FILL — Potassium Chloride Inj 2 mEq/ML: INTRAVENOUS | Qty: 40 | Status: AC

## 2012-05-02 NOTE — Progress Notes (Signed)
Pt ambulated with rolling walker approx. 1000 feet. Pt tolerated well, no SOB. Pt back in bed with call bell in reach. Will continue to monitor.

## 2012-05-02 NOTE — Discharge Summary (Signed)
Physician Discharge Summary  Patient ID: [name] [name] MRN: [id number] DOB/AGE: 1950/07/29 61 y.o.  Admit date: 04/26/2012 Discharge date: 05/03/2012  Admission Diagnoses: 1.Mulftivessel CAD 2.History of hypertension 3.History of DM  Discharge Diagnoses:  1.Mulftivessel CAD 2.History of hypertension 3.History of DM 4.ABL anemia   Procedure (s):  1.Cardiac catheterization done by Dr. Shirlee Latch on 04/27/2012: Coronary angiography:  Coronary dominance: right  Left mainstem: Suspect about 60% ostial/proximal stenosis (short vessel). There was ventricularization of the pressure waveform with engagement.  Left anterior descending (LAD): Small to moderate D1 with 70% proximal stenosis. The proximal LAD was diffusely disease between D1 and D2, reaching 95% stenosis just after D2 takeoff. Moderate D2 with 70% ostial stenosis.  Left circumflex (LCx): 90% ostial stenosis. The AV LCx was small and diffusely diseased. There wa was a tiny OM1. There was a moderate OM2 covering significant territory with a long area of up to 70% proximal stenosis. There was a branch off OM1 with subtotal occlusion ostially.  Right coronary artery (RCA): 40% proximal stenosis, 70% distal stenosis, large PLV with serial 70-80% stenoses, large PDA with 40% mid-vessel stenosis.  Left ventriculography: Left ventricular systolic function is normal, LVEF is estimated at 60%, there does not appear to be a wall motion abnormality.  2.1. Coronary artery bypass grafting x5 (left internal mammary artery to intramyocardial LAD, saphenous vein graft to 2nd diagonal, sequential saphenous vein graft to posterior descending and posterior lateral branch of right coronary, saphenous vein graft to obtuse marginal).  2. Endoscopic harvest of right leg greater saphenous vein, endoscopic exposure but not harvest of left leg small saphenous vein by Dr. Donata Clay on 04/28/2012.  History of Presenting Illness: This is a 61 year old  hypertensive, diabetic male with positive family history of CAD who presented to the emergency room with unstable angina and negative enzymes. Subsequent cardiac catheterization performed by Dr. Shirlee Latch on 04/27/2012 shows severe three-vessel coronary disease with preserved LV function. A  2-D echocardiogram was done. Results showed a LVEF of 60%, wall motion was normal, no pericardial effusion, no significant valvular disease.He was stable in the CCU without further chest pain. His chest x-ray is clear and he has had no significant arrhythmias. Because of his symptoms and severe multivessel coronary artery disease, surgical coronary revascularization was recommended. A cardiothoracic consultation was obtained with Dr. Donata Clay. Pre operative carotid US showed no evidence of significant internal carotid artery stenosis greater than 40%. Potential risks, benefits, and complications were discussed with the patient and he agreed to proceed.  Brief Hospital Course:  He was extubated without difficulty the evening of surgery. He remained afebrile and hemodynamically stable.His Theone Murdoch, a line, chest tubes, and foley were all removed early in his post operative course. He was started on Toprol XL which was titrated accordingly.He did go into afib with RVR post operatively. He was placed on an Amiodarone gttp. He remained in afib and did require Coumadin. His PT and INR were monitored daily. He was weaned off his insulin gttp. His pre op HGA1C was 6.1. He was started on Glipizide with good glucose control. He will require follow up as an outpatient. He was volume overloaded and diuresed accordingly. He was felt surgically stable for transfer from the ICU to PCTU for further convalescence on 04/30/2012. He was already tolerating a diet and had a bowel movement. Epicardial pacing wires and chest tube sutures will be removed prior to discharge. He is ambulating well on room air. Provided he remains afebrile,  hemodynamically  stable, and pending morning round evaluation, he will be surgically stable for discharge on 05/03/2012.  Latest Vital Signs: Blood pressure 117/80, pulse 86, temperature 97.9 F (36.6 C), temperature source Oral, resp. rate 19, height 6\' 4"  (1.93 m), weight 242 lb 1.6 oz (109.816 kg), SpO2 92.00%.  Physical Exam: General appearance: alert, cooperative and no distress  Heart: irregular rate and rhythm  Lungs: clear to auscultation bilaterally  Abdomen: soft, non-tender; bowel sounds normal; no masses, no organomegaly  Extremities: edema trace  Wound: clean and dry  Discharge Condition:Stable  Recent laboratory studies:  Lab Results  Component Value Date   WBC 13.2* 05/01/2012   HGB 10.3* 05/01/2012   HCT 29.5* 05/01/2012   MCV 89.1 05/01/2012   PLT 134* 05/01/2012   Lab Results  Component Value Date   NA 134* 05/02/2012   K 4.0 05/02/2012   CL 94* 05/02/2012   CO2 34* 05/02/2012   CREATININE 1.11 05/02/2012   GLUCOSE 121* 05/02/2012      Diagnostic Studies: Dg Chest 2 View  05/01/2012  *RADIOLOGY REPORT*  Clinical Data: Chest soreness, CABG.  CHEST - 2 VIEW  Comparison: 04/30/2012.  Findings: Trachea is midline.  Heart size stable.  Sternotomy wires are unchanged in position.  Right IJ catheter sheath and left chest tube have been removed in the interval.  Epicardial pacer wires remain in place.  Difficult to exclude tiny left apical pneumothorax.  Biapical pleural thickening.  Improving edema and overall lung expansion. Left lower lobe air space disease has improved somewhat in the interval as well.  Small left pleural effusion.  Tiny right pleural effusion.  IMPRESSION:  1.  Possible tiny left apical pneumothorax with left chest tube removal.  Attention on follow-up is warranted. 2.  Improving edema and left lower lobe air space consolidation. 3.  Tiny bilateral pleural effusions.   Original Report Authenticated By: Leanna Battles, M.D.      Ct Cardiac  Scoring  04/27/2012  *RADIOLOGY REPORT*  Clinical Data: 61 year old male with history of chest pain.  CT HEART WITHOUT CALCIUM SCORING  Comparison: No priors.  Comment:  The examination was originally planned to be a full cardiac CTA, however, after initial calcium scoring the extent of calcification in the coronary arteries was such that the CT examination was likely to be limited or nondiagnostic. Accordingly, the examination was terminated and changed to a calcium score.  Findings:  Calcium score: Left main:              9 LAD:                    527 Left Circumflex:        136 RCA:                    717  Total Coronary Artery Calcium Score:  1,389 Percentile Rank:  98th Arterial Age:           50 years e ([email or web address])  Additional Findings:  A 6 mm subpleural nodule in the medial aspect of the right middle lobe (image 21 of series six).  Within the visualized portions of the thorax there is no acute consolidative airspace disease, pleural effusion or pneumothorax.  Visualized portions of the upper abdomen are unremarkable. There are no aggressive appearing lytic or blastic lesions noted in the visualized portions of the skeleton.  IMPRESSION: 1.  Patient's total coronary artery calcium score is 1,389 which is 98 percentile for patient's of  matched age, gender and race/ethnicity.  Based upon this, the patient's calculated arterial age is that of a 61 year old male. 2.  Given the patient's acute symptomatology, further evaluation by Cardiology is recommended at this time.   Assessment for potential risk factor modification, dietary therapy or pharmacologic therapy may be warranted, if clinically indicated. 3.  6 mm subpleural pulmonary nodule in the medial aspect of the right middle lobe. If the patient is at high risk for bronchogenic carcinoma, follow-up chest CT at 6-12 months is recommended.  If the patient is at low risk for bronchogenic carcinoma, follow-up chest CT  at 12 months is recommended.  This recommendation follows the consensus statement: Guidelines for Management of Small Pulmonary Nodules Detected on CT Scans: A Statement from the Fleischner Society as published in Radiology 2005; 237:395-400.  Findings were discussed with the CDU mid level on 04/27/2012 at the 11:48 a.m.   Original Report Authenticated By: Trudie Reed, M.D.    Discharge Orders    Future Appointments: Provider: Department: Dept Phone: Center:   05/24/2012 2:30 PM [name], MD [facility] [phone] [facility]     Discharge Medicaedtions:    Medication List     As of 05/04/2012  9:25 AM    TAKE these medications         amiodarone 200 MG tablet   Commonly known as: PACERONE   Take 1 tablet (200 mg total) by mouth 2 (two) times daily.      aspirin EC 81 MG tablet   Take 81 mg by mouth daily.      atorvastatin 80 MG tablet   Commonly known as: LIPITOR   Take 1 tablet (80 mg total) by mouth daily at 6 PM.      fish oil-omega-3 fatty acids 1000 MG capsule   Take 1 g by mouth daily.      glipiZIDE 5 MG 24 hr tablet   Commonly known as: GLUCOTROL XL   Take 1 tablet (5 mg total) by mouth daily with breakfast.      metoprolol succinate 25 MG 24 hr tablet   Commonly known as: TOPROL-XL   Take 3 tablets (75 mg total) by mouth daily.      oxyCODONE 5 MG immediate release tablet   Commonly known as: Oxy IR/ROXICODONE   Take 1-2 tablets (5-10 mg total) by mouth every 4 (four) hours as needed for pain.      warfarin 5 MG tablet   Commonly known as: COUMADIN   Take 1 tablet (5 mg total) by mouth daily at 6 PM. Or as directed           The patient has been discharged on:   1.Beta Blocker:  Yes [ x  ]                              No   [   ]                              If No, reason:  2.Ace Inhibitor/ARB: Yes [   ]                                     No  [   x ]  If No,  reason:BP labile and preserved LVEF  3.Statin:   Yes [  x ]                  No  [   ]                  If No, reason:  4.Ecasa:  Yes  [  x ]                  No   [   ]                  If No, reason:   Follow Up Appointments:     Follow-up Information    Follow up with Marca Ancona, MD. Schedule an appointment as soon as possible for a visit in 2 weeks.   Contact information:   [address] [place] [address] [phone]       Follow up with Mikey Bussing, MD. On 05/24/2012. (Have a chest x-ray at 1:30, then see MD at 2:30)    Contact information:   [address] [place] [address] [phone]       Follow up with Marca Ancona, MD. (Call for a PT and INR to be drawn on Friday 05/05/2012)    Contact information:   [address] [place] [address] [phone]       Follow up with Tomi Bamberger, NP. (Call for follow up surveillance of HGA1C 6.1)    Contact information:   [address] [place] [address] [phone]          Signed: ZIMMERMAN,DONIELLE MPA-C 05/02/2012, 9:09 AM

## 2012-05-02 NOTE — Progress Notes (Signed)
Pt went into sinus rhythm at 1810. Will continue to monitor.

## 2012-05-02 NOTE — Progress Notes (Signed)
Patient ID: [name] [name], male   DOB: 29-Nov-1950, 61 y.o.   MRN: [id number]    SUBJECTIVE: Breathing better today.  Remains in atrial fibrillation.  Diuresed well.  HR still running in the 100s.      Marland Kitchen acetaminophen  1,000 mg Oral Q6H  . amiodarone  400 mg Oral BID  . aspirin EC  81 mg Oral Daily  . bisacodyl  10 mg Oral Daily   Or  . bisacodyl  10 mg Rectal Daily  . docusate sodium  200 mg Oral Daily  . [COMPLETED] furosemide  40 mg Intravenous Once  . glipiZIDE  5 mg Oral Q breakfast  . insulin aspart  0-24 Units Subcutaneous TID AC & HS  . metoprolol succinate  50 mg Oral Daily  . pantoprazole  40 mg Oral Daily  . potassium chloride  20 mEq Oral BID  . simvastatin  20 mg Oral q1800  . sodium chloride  3 mL Intravenous Q12H  . sodium chloride  3 mL Intravenous Q12H  . warfarin  2.5 mg Oral q1800  . Warfarin - Physician Dosing Inpatient   Does not apply q1800  . [DISCONTINUED] clopidogrel  75 mg Oral Q breakfast  . [DISCONTINUED] furosemide  40 mg Intravenous BID  . [DISCONTINUED] insulin glargine  15 Units Subcutaneous BH-q7a  . [DISCONTINUED] metoprolol tartrate  50 mg Oral BID  . [DISCONTINUED] warfarin IV  2.5 mg Intravenous q1800      Filed Vitals:   05/01/12 0939 05/01/12 1354 05/01/12 2034 05/02/12 0410  BP: 125/76 105/84 125/69 117/80  Pulse: 103 103 86   Temp: 97.7 F (36.5 C) 99.3 F (37.4 C) 97.7 F (36.5 C) 97.9 F (36.6 C)  TempSrc: Oral Oral Oral Oral  Resp: 18 18 19 19   Height:      Weight:      SpO2: 90% 93% 92% 92%    Intake/Output Summary (Last 24 hours) at 05/02/12 0800 Last data filed at 05/02/12 0400  Gross per 24 hour  Intake    480 ml  Output   2000 ml  Net  -1520 ml    LABS: Basic Metabolic Panel:  Basename 05/02/12 0445 05/01/12 0615 04/29/12 1548  NA 134* 131* --  K 4.0 4.8 --  CL 94* 92* --  CO2 34* 33* --  GLUCOSE 121* 147* --  BUN 29* 36* --  CREATININE 1.11 1.38* --  CALCIUM 8.9 8.8 --  MG -- -- 2.5  PHOS -- --  --   Liver Function Tests: No results found for this basename: AST:2,ALT:2,ALKPHOS:2,BILITOT:2,PROT:2,ALBUMIN:2 in the last 72 hours No results found for this basename: LIPASE:2,AMYLASE:2 in the last 72 hours CBC:  Basename 05/01/12 0615 04/30/12 0400  WBC 13.2* 19.0*  NEUTROABS -- --  HGB 10.3* 11.1*  HCT 29.5* 33.3*  MCV 89.1 89.0  PLT 134* 168   Cardiac Enzymes: No results found for this basename: CKTOTAL:3,CKMB:3,CKMBINDEX:3,TROPONINI:3 in the last 72 hours BNP: No components found with this basename: POCBNP:3 D-Dimer: No results found for this basename: DDIMER:2 in the last 72 hours Hemoglobin A1C: No results found for this basename: HGBA1C in the last 72 hours Fasting Lipid Panel: No results found for this basename: CHOL,HDL,LDLCALC,TRIG,CHOLHDL,LDLDIRECT in the last 72 hours Thyroid Function Tests: No results found for this basename: TSH,T4TOTAL,FREET3,T3FREE,THYROIDAB in the last 72 hours Anemia Panel: No results found for this basename: VITAMINB12,FOLATE,FERRITIN,TIBC,IRON,RETICCTPCT in the last 72 hours  RADIOLOGY: Dg Chest Portable 1 View In Am  04/30/2012  *RADIOLOGY REPORT*  Clinical Data: Post CABG.  PORTABLE CHEST - 1 VIEW  Comparison: 04/29/2012  Findings: The Swan-Ganz catheter has been removed.  The mediastinal drain has been removed.  Left chest tube is still present.  Patchy densities in the left mid and lower lung are unchanged and most likely associated with volume loss.  There is no definite pneumothorax.  Right jugular central line is still present.  Stable appearance of the heart.  IMPRESSION: Removal of support apparatuses as described.  No definite pneumothorax.  Persistent patchy densities at the left lung base.   Original Report Authenticated By: Richarda Overlie, M.D.    PHYSICAL EXAM General: NAD Neck: JVP 10 cm, no thyromegaly or thyroid nodule.  Lungs: Clear to auscultation bilaterally with normal respiratory effort. CV: Nondisplaced PMI.  Heart  irregular S1/S2, no S3/S4, no murmur.  Trace right ankle edema.  No carotid bruit.    Abdomen: Soft, nontender, no hepatosplenomegaly, no distention.  Neurologic: Alert and oriented x 3.  Psych: Normal affect. Extremities: No clubbing or cyanosis.   TELEMETRY: Reviewed telemetry pt in atrial fibrillation in the 100s  ASSESSMENT AND PLAN: 61 yo with history of CAD s/p NSTEMI now with CABG.  He has developed post-op atrial fibrillation. 1. CAD: s/p CABG.  He is on ASA 81 and Plavix.  Will put him on high dose statin given ACS.  2. Atrial fibrillation: Post-op.  He has started on warfarin and amiodarone.  Increase Toprol XL to 75 mg daily today with HR still high.  INR subtherapeutic today.  If he does not come out of atrial fibrillation prior to discharge, will plan TEE-guided cardioversion.  He will need therapeutic INR for this.  3. Acute diastolic CHF: Post-op volume overload.  EF 60% on echo.  Diuresed well yesterday, still some volume overload.  Will given another dose of IV Lasix this morning.  BUN/creatinine are better.   Marca Ancona 05/02/2012 8:00 AM

## 2012-05-02 NOTE — Progress Notes (Signed)
4 Days Post-Op Procedure(s) (LRB): CORONARY ARTERY BYPASS GRAFTING (CABG) (N/A) Subjective:  [name] [name] has no complaints this morning.  He is ambulating, No BM  Objective: Vital signs in last 24 hours: Temp:  [97.7 F (36.5 C)-99.3 F (37.4 C)] 97.9 F (36.6 C) (11/19 0410) Pulse Rate:  [86-103] 86  (11/18 2034) Cardiac Rhythm:  [-] Atrial fibrillation (11/18 1940) Resp:  [18-19] 19  (11/19 0410) BP: (105-125)/(69-84) 117/80 mmHg (11/19 0410) SpO2:  [90 %-93 %] 92 % (11/19 0410) Intake/Output from previous day: 11/18 0701 - 11/19 0700 In: 480 [P.O.:480] Out: 2000 [Urine:2000]  General appearance: alert, cooperative and no distress Heart: irregular rate and rhythm Lungs: clear to auscultation bilaterally Abdomen: soft, non-tender; bowel sounds normal; no masses,  no organomegaly Extremities: edema trace Wound: clean and dry  Lab Results:  Basename 05/01/12 0615 04/30/12 0400  WBC 13.2* 19.0*  HGB 10.3* 11.1*  HCT 29.5* 33.3*  PLT 134* 168   BMET:  Basename 05/02/12 0445 05/01/12 0615  NA 134* 131*  K 4.0 4.8  CL 94* 92*  CO2 34* 33*  GLUCOSE 121* 147*  BUN 29* 36*  CREATININE 1.11 1.38*  CALCIUM 8.9 8.8    PT/INR:  Basename 05/02/12 0445  LABPROT 16.0*  INR 1.31   ABG    Component Value Date/Time   PHART 7.350 04/28/2012 2017   HCO3 27.9* 04/28/2012 2017   TCO2 24 04/29/2012 1653   O2SAT 97.0 04/28/2012 2017   CBG (last 3)   Basename 05/02/12 0559 05/01/12 2037 05/01/12 1639  GLUCAP 132* 183* 144*    Assessment/Plan: S/P Procedure(s) (LRB): CORONARY ARTERY BYPASS GRAFTING (CABG) (N/A)  1. CV- Atrial Fibrillation rate in the 100s, will increase Lopressor to 75mg  daily per Cardiology recommendations, continue Amiodarone 2. INR 1.31, continue 2.5mg  daily 3. Pulm- no acute issues, continue IS 4. Volume Overload- weight is up since surgery, continue Lasix 5. CBGs- controlled, on Lantus, pre-diabetic will require outpatient follow up 6. Dispo-  patient remains in atrial fibrillation, need to get rate down prior to discharge home   LOS: 6 days    [name] [name], [name] [name] 05/02/2012

## 2012-05-02 NOTE — Progress Notes (Signed)
CARDIAC REHAB PHASE I   PRE:  Rate/Rhythm: 103afib  BP:  Supine: 115/79  Sitting:   Standing:    SaO2: 95%RA  MODE:  Ambulation: 890 ft   POST:  Rate/Rhythem: 118afib  BP:  Supine:   Sitting: 141/89  Standing:    SaO2: 97%RA 0830-0905 Pt walked 890 ft on RA with steady gait. Tolerated well. To recliner after walk with call bell. Used rolling walker to ambulate. Case manager getting pt walker for home use. Pt very motivated to walk.  [name] [name]

## 2012-05-02 NOTE — Progress Notes (Signed)
Pt ambulated 550 ft with front wheel walker and standby assist. No complaints of SOB or pain. O2 sats 97 on RA after ambulation. Pt now resting in chair, will continue to monitor.

## 2012-05-03 LAB — BASIC METABOLIC PANEL
BUN: 23 mg/dL (ref 6–23)
CO2: 29 mEq/L (ref 19–32)
Calcium: 9.3 mg/dL (ref 8.4–10.5)
Chloride: 95 mEq/L — ABNORMAL LOW (ref 96–112)
Creatinine, Ser: 0.99 mg/dL (ref 0.50–1.35)
GFR calc Af Amer: >90 mL/min (ref >90)
GFR calc non Af Amer: 87 mL/min — ABNORMAL LOW (ref >90)
Glucose, Bld: 177 mg/dL — ABNORMAL HIGH (ref 70–99)
Potassium: 4.1 mEq/L (ref 3.5–5.1)
Sodium: 132 mEq/L — ABNORMAL LOW (ref 135–145)

## 2012-05-03 LAB — PROTIME-INR
INR: 1.22 (ref 0.00–1.49)
Prothrombin Time: 15.2 seconds (ref 11.6–15.2)

## 2012-05-03 LAB — CBC
HCT: 28.1 % — ABNORMAL LOW (ref 39.0–52.0)
Hemoglobin: 9.8 g/dL — ABNORMAL LOW (ref 13.0–17.0)
MCH: 30.7 pg (ref 26.0–34.0)
MCHC: 34.9 g/dL (ref 30.0–36.0)
MCV: 88.1 fL (ref 78.0–100.0)
Platelets: 198 10*3/uL (ref 150–400)
RBC: 3.19 MIL/uL — ABNORMAL LOW (ref 4.22–5.81)
RDW: 12.8 % (ref 11.5–15.5)
WBC: 11.5 10*3/uL — ABNORMAL HIGH (ref 4.0–10.5)

## 2012-05-03 LAB — GLUCOSE, CAPILLARY
Glucose-Capillary: 127 mg/dL — ABNORMAL HIGH (ref 70–99)
Glucose-Capillary: 149 mg/dL — ABNORMAL HIGH (ref 70–99)
Glucose-Capillary: 98 mg/dL (ref 70–99)
Glucose-Capillary: 102 mg/dL — ABNORMAL HIGH (ref 70–99)

## 2012-05-03 MED ORDER — ATORVASTATIN CALCIUM 80 MG PO TABS: 80.0000 mg | ORAL_TABLET | Freq: Every day | ORAL | Status: DC

## 2012-05-03 MED ORDER — WARFARIN SODIUM 5 MG PO TABS: 5.0000 mg | ORAL_TABLET | Freq: Every day | ORAL | Status: DC

## 2012-05-03 MED ORDER — AMIODARONE HCL 200 MG PO TABS
200.0000 mg | ORAL_TABLET | Freq: Two times a day (BID) | ORAL | Status: DC
  Administered 2012-05-03 – 2012-05-04 (×3): 200 mg via ORAL
  Filled 2012-05-03 (×4): qty 1

## 2012-05-03 MED ORDER — AMIODARONE HCL 200 MG PO TABS: 200.0000 mg | ORAL_TABLET | Freq: Two times a day (BID) | ORAL | Status: DC

## 2012-05-03 MED ORDER — WARFARIN SODIUM 5 MG PO TABS: 2.5000 mg | ORAL_TABLET | Freq: Every day | ORAL | Status: DC

## 2012-05-03 MED ORDER — FUROSEMIDE 40 MG PO TABS
40.0000 mg | ORAL_TABLET | Freq: Every day | ORAL | Status: DC
  Administered 2012-05-03 – 2012-05-04 (×2): 40 mg via ORAL
  Filled 2012-05-03 (×2): qty 1

## 2012-05-03 MED ORDER — FUROSEMIDE 40 MG PO TABS: 40.0000 mg | ORAL_TABLET | Freq: Every day | ORAL | Status: DC

## 2012-05-03 MED ORDER — LACTULOSE 10 GM/15ML PO SOLN
20.0000 g | Freq: Once | ORAL | Status: AC
  Administered 2012-05-03: 20 g via ORAL
  Filled 2012-05-03: qty 30

## 2012-05-03 MED ORDER — WARFARIN SODIUM 5 MG PO TABS
5.0000 mg | ORAL_TABLET | Freq: Every day | ORAL | Status: DC
  Administered 2012-05-03: 5 mg via ORAL
  Filled 2012-05-03 (×2): qty 1

## 2012-05-03 MED ORDER — AMIODARONE HCL 400 MG PO TABS: 200.0000 mg | ORAL_TABLET | Freq: Two times a day (BID) | ORAL | Status: DC

## 2012-05-03 MED ORDER — POTASSIUM CHLORIDE CRYS ER 20 MEQ PO TBCR: 20.0000 meq | EXTENDED_RELEASE_TABLET | Freq: Every day | ORAL | Status: DC

## 2012-05-03 NOTE — Progress Notes (Signed)
Pt ambulated 700 ft in hallway with his wife and using rolling walker, tolerated well [name] [name] A

## 2012-05-03 NOTE — Progress Notes (Addendum)
                   [address]            [place] [address]          [phone]      5 Days Post-Op Procedure(s) (LRB): CORONARY ARTERY BYPASS GRAFTING (CABG) (N/A)  Subjective: Patient without bowel movement. Feels fairly well this am  Objective: Vital signs in last 24 hours: Temp:  [97 F (36.1 C)-98.9 F (37.2 C)] 97.4 F (36.3 C) (11/20 0510) Pulse Rate:  [66-112] 67  (11/20 0510) Cardiac Rhythm:  [-] Normal sinus rhythm (11/19 1935) Resp:  [16-18] 18  (11/20 0510) BP: (114-149)/(65-87) 130/65 mmHg (11/20 0510) SpO2:  [95 %-98 %] 98 % (11/20 0510) Weight:  [233 lb 1.6 oz (105.733 kg)] 233 lb 1.6 oz (105.733 kg) (11/20 0510)  Pre op weight  107.5 kg Current Weight  05/03/12 233 lb 1.6 oz (105.733 kg)      Intake/Output from previous day: 11/19 0701 - 11/20 0700 In: 480 [P.O.:480] Out: 2575 [Urine:2575]   Physical Exam:  Cardiovascular: RRR, no murmurs, gallops, or rubs. Pulmonary: Clear to auscultation bilaterally; no rales, wheezes, or rhonchi. Abdomen: Soft, non tender, bowel sounds present. Extremities: Mild bilateral lower extremity edema. Wounds: Clean and dry.  No erythema or signs of infection.  Lab Results: CBC: Basename 05/03/12 0450 05/01/12 0615  WBC 11.5* 13.2*  HGB 9.8* 10.3*  HCT 28.1* 29.5*  PLT 198 134*   BMET:  Basename 05/02/12 0445 05/01/12 0615  NA 134* 131*  K 4.0 4.8  CL 94* 92*  CO2 34* 33*  GLUCOSE 121* 147*  BUN 29* 36*  CREATININE 1.11 1.38*  CALCIUM 8.9 8.8    PT/INR:  Lab Results  Component Value Date   INR 1.22 05/03/2012   INR 1.31 05/02/2012   INR 1.43 05/01/2012   ABG:  INR: Will add last result for INR, ABG once components are confirmed Will add last 4 CBG results once components are confirmed  Assessment/Plan:  1. CV - Previous afib with RVR. Converted to SR around 6:18 last evening.Maintaining SR this am. On Amiodarone 400 bid and Toprol XL 75 daily. HR remaining in the low 70's.  Will decrease Amiodarone to 200 bid.Coumadin increased to 5 as INR is decreasing. 2.  Pulmonary - Encourage incentive spirometer 3. Volume Overload - Continue with diuresis 4.  Acute blood loss anemia - H and H 9.8 and 28.1 this am 5.Thrombocytopenia resolved as platelets up to 198,000 6. Remove EPW and sutures 7.LOC constipation 8.Possible discharge later today vs am  ZIMMERMAN,DONIELLE MPA-C 05/03/2012,8:14 AM   patient examined and medical record reviewed,agree with above note.DC in AM if he maintains nsr VAN TRIGT III,PETER 05/03/2012

## 2012-05-03 NOTE — Progress Notes (Signed)
CARDIAC REHAB PHASE I   PRE:  Rate/Rhythm: 70 SR    BP: sitting 127/69    SaO2: 99 RA  MODE:  Ambulation: 890 ft   POST:  Rate/Rhythm: 77    BP: sitting 136/90     SaO2: 97 RA  Tolerated well with slow pace with RW. No c/o. Maintained SR. [phone]  Elissa Lovett [place] CES, ACSM

## 2012-05-03 NOTE — Progress Notes (Signed)
Patient ID: [name] [name], male   DOB: [birth year], 61 y.o.   MRN: [id number]     SUBJECTIVE: Patient is back in NSR this morning.  He feels better in general.  Diuresed well yesterday.      Marland Kitchen acetaminophen  1,000 mg Oral Q6H  . amiodarone  200 mg Oral BID  . aspirin EC  81 mg Oral Daily  . atorvastatin  80 mg Oral q1800  . bisacodyl  10 mg Oral Daily   Or  . bisacodyl  10 mg Rectal Daily  . docusate sodium  200 mg Oral Daily  . [COMPLETED] furosemide  40 mg Intravenous Once  . furosemide  40 mg Oral Daily  . glipiZIDE  5 mg Oral Q breakfast  . insulin aspart  0-24 Units Subcutaneous TID AC & HS  . lactulose  20 g Oral Once  . metoprolol succinate  75 mg Oral Daily  . pantoprazole  40 mg Oral Daily  . potassium chloride  20 mEq Oral BID  . [COMPLETED] potassium chloride  40 mEq Oral Once  . sodium chloride  3 mL Intravenous Q12H  . sodium chloride  3 mL Intravenous Q12H  . warfarin  5 mg Oral q1800  . Warfarin - Physician Dosing Inpatient   Does not apply q1800  . [DISCONTINUED] amiodarone  400 mg Oral BID  . [DISCONTINUED] warfarin  2.5 mg Oral q1800      Filed Vitals:   05/02/12 1012 05/02/12 1430 05/02/12 2048 05/03/12 0510  BP: 134/79 149/87 114/76 130/65  Pulse: 112 110 66 67  Temp: 97 F (36.1 C) 98.4 F (36.9 C) 98.9 F (37.2 C) 97.4 F (36.3 C)  TempSrc: Oral Axillary Oral Oral  Resp: 18 16 18 18   Height:      Weight:    233 lb 1.6 oz (105.733 kg)  SpO2: 95% 96% 95% 98%    Intake/Output Summary (Last 24 hours) at 05/03/12 0911 Last data filed at 05/03/12 0500  Gross per 24 hour  Intake    480 ml  Output   2375 ml  Net  -1895 ml    LABS: Basic Metabolic Panel:  Basename 05/02/12 0445 05/01/12 0615  NA 134* 131*  K 4.0 4.8  CL 94* 92*  CO2 34* 33*  GLUCOSE 121* 147*  BUN 29* 36*  CREATININE 1.11 1.38*  CALCIUM 8.9 8.8  MG -- --  PHOS -- --   Liver Function Tests: No results found for this basename:  AST:2,ALT:2,ALKPHOS:2,BILITOT:2,PROT:2,ALBUMIN:2 in the last 72 hours No results found for this basename: LIPASE:2,AMYLASE:2 in the last 72 hours CBC:  Basename 05/03/12 0450 05/01/12 0615  WBC 11.5* 13.2*  NEUTROABS -- --  HGB 9.8* 10.3*  HCT 28.1* 29.5*  MCV 88.1 89.1  PLT 198 134*    RADIOLOGY: Dg Chest Portable 1 View In Am  04/30/2012  *RADIOLOGY REPORT*  Clinical Data: Post CABG.  PORTABLE CHEST - 1 VIEW  Comparison: 04/29/2012  Findings: The Swan-Ganz catheter has been removed.  The mediastinal drain has been removed.  Left chest tube is still present.  Patchy densities in the left mid and lower lung are unchanged and most likely associated with volume loss.  There is no definite pneumothorax.  Right jugular central line is still present.  Stable appearance of the heart.  IMPRESSION: Removal of support apparatuses as described.  No definite pneumothorax.  Persistent patchy densities at the left lung base.   Original Report Authenticated By: Richarda Overlie, M.D.  PHYSICAL EXAM General: NAD Neck: JVP 8-9 cm, no thyromegaly or thyroid nodule.  Lungs: Clear to auscultation bilaterally with normal respiratory effort. CV: Nondisplaced PMI.  Heart regular S1/S2, no S3/S4, no murmur.  Trace right ankle edema.  No carotid bruit.    Abdomen: Soft, nontender, no hepatosplenomegaly, no distention.  Neurologic: Alert and oriented x 3.  Psych: Normal affect. Extremities: No clubbing or cyanosis.   TELEMETRY: Reviewed telemetry pt in NSR in 70s  ASSESSMENT AND PLAN: 61 yo with history of CAD s/p NSTEMI now with CABG.  He has developed post-op atrial fibrillation. 1. CAD: s/p CABG.  Progressing well.  2. Atrial fibrillation: Post-op, now back in NSR.  He is on warfarin and amiodarone.  Would continue warfarin and amiodarone for about a month after discharge as long as he does not have an atrial fibrillation recurrence.  3. Acute diastolic CHF: Post-op volume overload.  EF normal on echo.  JVP  still mildly elevated but has diuresed well.  Will switch to po Lasix today.    Marca Ancona 05/03/2012 9:11 AM

## 2012-05-03 NOTE — Progress Notes (Signed)
Removed pt's EPW and chest tube sutures per MD order. No arrhythmias overnight. Pt in sinus rhythm. Tips of EPW were intact, no bleeding noted. Pt tolerated well. Placed steri strips and benzoin over site where chest tube sutures were. Pt on bed rest for one hour with frequent vitals set up. Will continue to monitor.

## 2012-05-04 ENCOUNTER — Telehealth: Payer: Self-pay | Admitting: Cardiology

## 2012-05-04 LAB — PROTIME-INR
INR: 1.28 (ref 0.00–1.49)
Prothrombin Time: 15.7 seconds — ABNORMAL HIGH (ref 11.6–15.2)

## 2012-05-04 LAB — BASIC METABOLIC PANEL
BUN: 24 mg/dL — ABNORMAL HIGH (ref 6–23)
CO2: 27 mEq/L (ref 19–32)
Calcium: 9.4 mg/dL (ref 8.4–10.5)
Chloride: 95 mEq/L — ABNORMAL LOW (ref 96–112)
Creatinine, Ser: 1.13 mg/dL (ref 0.50–1.35)
GFR calc Af Amer: 80 mL/min — ABNORMAL LOW (ref >90)
GFR calc non Af Amer: 69 mL/min — ABNORMAL LOW (ref >90)
Glucose, Bld: 136 mg/dL — ABNORMAL HIGH (ref 70–99)
Potassium: 4.5 mEq/L (ref 3.5–5.1)
Sodium: 131 mEq/L — ABNORMAL LOW (ref 135–145)

## 2012-05-04 LAB — GLUCOSE, CAPILLARY: Glucose-Capillary: 121 mg/dL — ABNORMAL HIGH (ref 70–99)

## 2012-05-04 MED ORDER — POLYETHYLENE GLYCOL 3350 17 G PO PACK
17.0000 g | PACK | Freq: Once | ORAL | Status: AC
  Administered 2012-05-04: 17 g via ORAL
  Filled 2012-05-04: qty 1

## 2012-05-04 NOTE — Telephone Encounter (Signed)
Pt's d/c instructions per wife, were to have pt seen Monday with mclean and coumadin, and then again with mclean in two weeks, this didn't make any sense to me so I scheduled one in two weeks and checking to see if pt needs to come Monday for coumadin and mclean or just coumadin, pls call  [phone]

## 2012-05-04 NOTE — Telephone Encounter (Signed)
**Note De-Identified Maylen Waltermire Obfuscation** Pt's wife advised that according to hosp. d/c pt. Is to f/u with Dr. Shirlee Latch in 2 weeks and Coumadin check on 11/22. F/u with Dr. Shirlee Latch is scheduled for 12/5 and CCVR scheduled for 11/2, pt's wife aware.

## 2012-05-04 NOTE — Progress Notes (Addendum)
                   [address]            [place] [address]          [phone]      6 Days Post-Op Procedure(s) (LRB): CORONARY ARTERY BYPASS GRAFTING (CABG) (N/A)  Subjective: Patient passing a lot of flatus but no bowel movement yet. Wants  to go home.  Objective: Vital signs in last 24 hours: Temp:  [98 F (36.7 C)-99 F (37.2 C)] 99 F (37.2 C) (11/21 0442) Pulse Rate:  [67-76] 76  (11/21 0442) Cardiac Rhythm:  [-] Normal sinus rhythm (11/21 0752) Resp:  [18-20] 18  (11/21 0442) BP: (119-127)/(56-77) 127/76 mmHg (11/21 0442) SpO2:  [95 %-99 %] 95 % (11/21 0442) Weight:  [232 lb 6.4 oz (105.416 kg)] 232 lb 6.4 oz (105.416 kg) (11/21 0442)  Pre op weight 108 kg Current Weight  05/04/12 232 lb 6.4 oz (105.416 kg)      Intake/Output from previous day: 11/20 0701 - 11/21 0700 In: -  Out: 650 [Urine:650]   Physical Exam:  Cardiovascular: RRR, no murmurs, gallops, or rubs. Pulmonary: Clear to auscultation bilaterally; no rales, wheezes, or rhonchi. Abdomen: Soft, non tender, bowel sounds present. Extremities: Mild bilateral lower extremity edema. Wounds: Clean and dry.  No erythema or signs of infection.  Lab Results: CBC: Basename 05/03/12 0450  WBC 11.5*  HGB 9.8*  HCT 28.1*  PLT 198   BMET:  Basename 05/04/12 0445 05/03/12 0855  NA 131* 132*  K 4.5 4.1  CL 95* 95*  CO2 27 29  GLUCOSE 136* 177*  BUN 24* 23  CREATININE 1.13 0.99  CALCIUM 9.4 9.3    PT/INR:  Lab Results  Component Value Date   INR 1.28 05/04/2012   INR 1.22 05/03/2012   INR 1.31 05/02/2012   ABG:  INR: Will add last result for INR, ABG once components are confirmed Will add last 4 CBG results once components are confirmed  Assessment/Plan:  1. CV - Previous afib with RVR. Maintaining SR. Continue current medications 2.  Pulmonary - Encourage incentive spirometer 3. Volume Overload - Continue with diuresis 4.  Acute blood loss anemia - Last H and H  stable at 9.8 and 28.1. 5.Disharge home 6.Miralax for constipation  ZIMMERMAN,DONIELLE MPA-C 05/04/2012,9:12 AM

## 2012-05-04 NOTE — Progress Notes (Signed)
Patient ID: [name] [name], male   DOB: [birth year], 61 y.o.   MRN: [id number]     SUBJECTIVE: Doing well, remains in NSR.  Constipated.       . [EXPIRED] acetaminophen  1,000 mg Oral Q6H  . amiodarone  200 mg Oral BID  . aspirin EC  81 mg Oral Daily  . atorvastatin  80 mg Oral q1800  . bisacodyl  10 mg Oral Daily   Or  . bisacodyl  10 mg Rectal Daily  . docusate sodium  200 mg Oral Daily  . furosemide  40 mg Oral Daily  . glipiZIDE  5 mg Oral Q breakfast  . insulin aspart  0-24 Units Subcutaneous TID AC & HS  . [COMPLETED] lactulose  20 g Oral Once  . metoprolol succinate  75 mg Oral Daily  . pantoprazole  40 mg Oral Daily  . potassium chloride  20 mEq Oral BID  . sodium chloride  3 mL Intravenous Q12H  . sodium chloride  3 mL Intravenous Q12H  . warfarin  5 mg Oral q1800  . Warfarin - Physician Dosing Inpatient   Does not apply q1800  . [DISCONTINUED] amiodarone  400 mg Oral BID  . [DISCONTINUED] warfarin  2.5 mg Oral q1800      Filed Vitals:   05/03/12 1030 05/03/12 1403 05/03/12 1948 05/04/12 0442  BP: 123/66 119/74 120/77 127/76  Pulse: 72 67 70 76  Temp:  98 F (36.7 C) 98.8 F (37.1 C) 99 F (37.2 C)  TempSrc:  Oral Oral Oral  Resp:  18 20 18   Height:      Weight:    232 lb 6.4 oz (105.416 kg)  SpO2:  99% 96% 95%    Intake/Output Summary (Last 24 hours) at 05/04/12 0801 Last data filed at 05/04/12 0444  Gross per 24 hour  Intake      0 ml  Output    650 ml  Net   -650 ml    LABS: Basic Metabolic Panel:  Basename 05/04/12 0445 05/03/12 0855  NA 131* 132*  K 4.5 4.1  CL 95* 95*  CO2 27 29  GLUCOSE 136* 177*  BUN 24* 23  CREATININE 1.13 0.99  CALCIUM 9.4 9.3  MG -- --  PHOS -- --   Liver Function Tests: No results found for this basename: AST:2,ALT:2,ALKPHOS:2,BILITOT:2,PROT:2,ALBUMIN:2 in the last 72 hours No results found for this basename: LIPASE:2,AMYLASE:2 in the last 72 hours CBC:  Basename 05/03/12 0450  WBC 11.5*  NEUTROABS --   HGB 9.8*  HCT 28.1*  MCV 88.1  PLT 198    RADIOLOGY: Dg Chest Portable 1 View In Am  04/30/2012  *RADIOLOGY REPORT*  Clinical Data: Post CABG.  PORTABLE CHEST - 1 VIEW  Comparison: 04/29/2012  Findings: The Swan-Ganz catheter has been removed.  The mediastinal drain has been removed.  Left chest tube is still present.  Patchy densities in the left mid and lower lung are unchanged and most likely associated with volume loss.  There is no definite pneumothorax.  Right jugular central line is still present.  Stable appearance of the heart.  IMPRESSION: Removal of support apparatuses as described.  No definite pneumothorax.  Persistent patchy densities at the left lung base.   Original Report Authenticated By: Richarda Overlie, M.D.    PHYSICAL EXAM General: NAD Neck: JVP 8 cm, no thyromegaly or thyroid nodule.  Lungs: Clear to auscultation bilaterally with normal respiratory effort. CV: Nondisplaced PMI.  Heart regular S1/S2, no  S3/S4, no murmur.  Trace right ankle edema.  No carotid bruit.    Abdomen: Soft, nontender, no hepatosplenomegaly, no distention.  Neurologic: Alert and oriented x 3.  Psych: Normal affect. Extremities: No clubbing or cyanosis.   TELEMETRY: Reviewed telemetry pt in NSR in 70s  ASSESSMENT AND PLAN: 61 yo with history of CAD s/p NSTEMI now with CABG.  He has developed post-op atrial fibrillation. 1. CAD: s/p CABG.  Progressing well.  2. Atrial fibrillation: Post-op, now back in NSR.  He is on warfarin and amiodarone.  Would continue warfarin and amiodarone for about a month after discharge as long as he does not have an atrial fibrillation recurrence.  3. Acute diastolic CHF: Post-op volume overload.  EF normal on echo.  Volume status looks better now, continue po Lasix.    Marca Ancona 05/04/2012 8:01 AM

## 2012-05-04 NOTE — Progress Notes (Signed)
[id number] Cardiac Rehab Completed discharge education with pt and wife. They voice understanding. Pt agrees to Outpt. CRP in [place], will send referral.

## 2012-05-04 NOTE — Progress Notes (Signed)
Pt/family given discharge instructions, medication lists, follow up appointments, and when to call the doctor.  Pt/family verbalizes understanding. Pt given signs and symptoms of infection. Pt verbalized teach back. [name] [name]

## 2012-05-05 NOTE — Progress Notes (Signed)
Pt/family given discharge instructions, medication lists, follow up appointments, and when to call the doctor.  Pt/family verbalizes understanding. Pt given signs and symptoms of infection. [name] [name]    

## 2012-05-08 ENCOUNTER — Ambulatory Visit (INDEPENDENT_AMBULATORY_CARE_PROVIDER_SITE_OTHER): Payer: BC Managed Care – PPO

## 2012-05-08 DIAGNOSIS — I4891 Unspecified atrial fibrillation: Secondary | ICD-10-CM

## 2012-05-08 DIAGNOSIS — Z7901 Long term (current) use of anticoagulants: Secondary | ICD-10-CM

## 2012-05-08 LAB — POCT INR: INR: 1.5

## 2012-05-09 NOTE — Discharge Summary (Signed)
patient examined and medical record reviewed,agree with above note. VAN TRIGT III,Dalon Reichart 05/09/2012   

## 2012-05-16 ENCOUNTER — Encounter: Payer: Self-pay | Admitting: *Deleted

## 2012-05-18 ENCOUNTER — Ambulatory Visit (INDEPENDENT_AMBULATORY_CARE_PROVIDER_SITE_OTHER): Payer: BC Managed Care – PPO | Admitting: Cardiology

## 2012-05-18 ENCOUNTER — Ambulatory Visit (INDEPENDENT_AMBULATORY_CARE_PROVIDER_SITE_OTHER): Payer: BC Managed Care – PPO | Admitting: Pharmacist

## 2012-05-18 ENCOUNTER — Encounter: Payer: Self-pay | Admitting: Cardiology

## 2012-05-18 VITALS — BP 132/64 | HR 60 | Ht 76.0 in | Wt 221.0 lb

## 2012-05-18 DIAGNOSIS — I4891 Unspecified atrial fibrillation: Secondary | ICD-10-CM

## 2012-05-18 DIAGNOSIS — E7800 Pure hypercholesterolemia, unspecified: Secondary | ICD-10-CM

## 2012-05-18 DIAGNOSIS — Z7901 Long term (current) use of anticoagulants: Secondary | ICD-10-CM

## 2012-05-18 DIAGNOSIS — I25810 Atherosclerosis of coronary artery bypass graft(s) without angina pectoris: Secondary | ICD-10-CM

## 2012-05-18 DIAGNOSIS — I10 Essential (primary) hypertension: Secondary | ICD-10-CM

## 2012-05-18 LAB — HEPATIC FUNCTION PANEL
ALT: 38 U/L (ref 0–53)
AST: 24 U/L (ref 0–37)
Albumin: 3.6 g/dL (ref 3.5–5.2)
Alkaline Phosphatase: 107 U/L (ref 39–117)
Bilirubin, Direct: 0.1 mg/dL (ref 0.0–0.3)
Total Bilirubin: 0.5 mg/dL (ref 0.3–1.2)
Total Protein: 8 g/dL (ref 6.0–8.3)

## 2012-05-18 LAB — POCT INR: INR: 1.9

## 2012-05-18 LAB — TSH: TSH: 3.73 u[IU]/mL (ref 0.35–5.50)

## 2012-05-18 MED ORDER — AMIODARONE HCL 200 MG PO TABS: 200.0000 mg | ORAL_TABLET | Freq: Every day | ORAL | Status: DC

## 2012-05-18 MED ORDER — LISINOPRIL 5 MG PO TABS: 5.0000 mg | ORAL_TABLET | Freq: Every day | ORAL | Status: DC

## 2012-05-18 NOTE — Patient Instructions (Addendum)
Decrease amiodarone to 200mg  daily.  Start lisinopril 5mg  daily.  Your physician recommends that you have lab work today--Liver profile/TSH.  Dr Shirlee Latch recommends participation in Cardiac Rehab at [facility].   Your physician recommends that you schedule a follow-up appointment in: 1 month with Dr Shirlee Latch.

## 2012-05-18 NOTE — Progress Notes (Signed)
Patient ID: [name] [name], male   DOB: 1950-10-21, 61 y.o.   MRN: [id number] PCP: Tomi Bamberger  61 yo with history of CAD s/p recent CABG presents for cardiology followup.  Mr Erven presented in 11/13 with unstable angina-type symptoms.  LHC showed severe 3VD.  CABG was done the next day. Post-operative course was complicated by atrial fibrillation that converted to NSR with amiodarone.  He is now on warfarin and amiodarone.  He has been feeling well in general since getting home.  He has some mild chest soreness at the surgical site.  No exertional dyspnea.  He is walking for 15 minutes twice a day.    ECG: NSR, old lateral MI, iRBBB, left axis deviation.  Labs (11/13): K 4.5, creatinine 1.13  PMH: 1. HTN 2. Type II DM 3. Hyperlipidemia 4. CAD: Unstable angina 11/13 with LHC showing severe 3 vessel disease.  He had CABG Donata Clay) with LIMA-LAD, SVG-D2, seq SVG-PLV/PDA, SVG-OM.  Echo (11/13) with EF 60%, mild LAE.  5. Atrial fibrillation: Post-op CABG.   SH: Semi-retired, does some landscaping work.  Married.  Nonsmoker, lives in [place].   FH: Mother and father both had CABG in their 95s.   ROS: All systems reviewed and negative except as per HPI.   Current Outpatient Prescriptions  Medication Sig Dispense Refill  . aspirin EC 81 MG tablet Take 81 mg by mouth daily.      Marland Kitchen atorvastatin (LIPITOR) 80 MG tablet Take 1 tablet (80 mg total) by mouth daily at 6 PM.  30 tablet  1  . fexofenadine (ALLEGRA) 180 MG tablet Take 180 mg by mouth daily. As needed for allergies      . fish oil-omega-3 fatty acids 1000 MG capsule Take 1 g by mouth daily.      Marland Kitchen glipiZIDE (GLUCOTROL XL) 5 MG 24 hr tablet Take 1 tablet (5 mg total) by mouth daily with breakfast.  30 tablet  1  . metoprolol succinate (TOPROL-XL) 25 MG 24 hr tablet Take 3 tablets (75 mg total) by mouth daily.  90 tablet  1  . oxyCODONE (OXY IR/ROXICODONE) 5 MG immediate release tablet Take 1-2 tablets (5-10 mg total) by mouth  every 4 (four) hours as needed for pain.  30 tablet  0  . warfarin (COUMADIN) 5 MG tablet Take 1 tablet (5 mg total) by mouth daily at 6 PM. Or as directed  30 tablet  1  . amiodarone (PACERONE) 200 MG tablet Take 1 tablet (200 mg total) by mouth daily.  30 tablet  3  . lisinopril (PRINIVIL,ZESTRIL) 5 MG tablet Take 1 tablet (5 mg total) by mouth daily.  30 tablet  3   BP 132/64  Pulse 60  Ht 6\' 4"  (1.93 m)  Wt 221 lb (100.245 kg)  BMI 26.90 kg/m2 General: NAD Neck: No JVD, no thyromegaly or thyroid nodule.  Lungs: Clear to auscultation bilaterally with normal respiratory effort. CV: Nondisplaced PMI.  Heart regular S1/S2, no S3/S4, no murmur.  No peripheral edema.  No carotid bruit.  Normal pedal pulses.  Abdomen: Soft, nontender, no hepatosplenomegaly, no distention.  Neurologic: Alert and oriented x 3.  Psych: Normal affect. Extremities: No clubbing or cyanosis.   Assessment/Plan: 1. CAD: Stable s/p CABG.  Continue ASA 81, Toprol XL, atorva 80.  He needs to start lisinopril 5 mg daily for secondary prevention.  I will set him up for cardiac rehab at [facility].  2. Atrial fibrillation: Post-op CABG.  He remains in  NSR on amiodarone.  Continue warfarin and amiodarone for 1 month unless atrial fibrillation recurs.  If he has a future recurrence, he will need long-term anticoagulation.  3. Hyperlipidemia: Continue atorvastatin, check lipids/LFTs in 2 months.  4. HTN: BP controlled.   Marca Ancona 05/19/2012

## 2012-05-19 DIAGNOSIS — I25708 Atherosclerosis of coronary artery bypass graft(s), unspecified, with other forms of angina pectoris: Secondary | ICD-10-CM | POA: Insufficient documentation

## 2012-05-22 ENCOUNTER — Other Ambulatory Visit: Payer: Self-pay | Admitting: *Deleted

## 2012-05-22 DIAGNOSIS — Z951 Presence of aortocoronary bypass graft: Secondary | ICD-10-CM

## 2012-05-23 ENCOUNTER — Telehealth: Payer: Self-pay | Admitting: Cardiology

## 2012-05-23 NOTE — Telephone Encounter (Signed)
New Problem:    Patient's wife returned Lynn's call regarding her husbands blood work results.  Please call back.

## 2012-05-23 NOTE — Telephone Encounter (Signed)
LM on voice ID voice mail labs normal.

## 2012-05-24 ENCOUNTER — Ambulatory Visit
Admission: RE | Admit: 2012-05-24 | Discharge: 2012-05-24 | Disposition: A | Discharge status: Home / Self Care | Payer: BC Managed Care – PPO | Source: Ambulatory Visit | Attending: Cardiothoracic Surgery | Admitting: Cardiothoracic Surgery

## 2012-05-24 ENCOUNTER — Ambulatory Visit (INDEPENDENT_AMBULATORY_CARE_PROVIDER_SITE_OTHER): Payer: BC Managed Care – PPO | Admitting: *Deleted

## 2012-05-24 ENCOUNTER — Ambulatory Visit (INDEPENDENT_AMBULATORY_CARE_PROVIDER_SITE_OTHER): Payer: Self-pay | Admitting: Cardiothoracic Surgery

## 2012-05-24 ENCOUNTER — Encounter: Payer: Self-pay | Admitting: Cardiothoracic Surgery

## 2012-05-24 VITALS — BP 117/74 | HR 62 | Resp 18 | Ht 76.0 in | Wt 221.0 lb

## 2012-05-24 DIAGNOSIS — Z951 Presence of aortocoronary bypass graft: Secondary | ICD-10-CM

## 2012-05-24 DIAGNOSIS — I2510 Atherosclerotic heart disease of native coronary artery without angina pectoris: Secondary | ICD-10-CM

## 2012-05-24 DIAGNOSIS — I4891 Unspecified atrial fibrillation: Secondary | ICD-10-CM

## 2012-05-24 DIAGNOSIS — Z7901 Long term (current) use of anticoagulants: Secondary | ICD-10-CM

## 2012-05-24 LAB — POCT INR: INR: 2.3

## 2012-05-24 NOTE — Progress Notes (Signed)
PCP is Tomi Bamberger, NP Referring Provider is [name], MD  Chief Complaint  Patient presents with  . Routine Post Op    3 week f/u from surgery with CXR, S/P CABG x5 on 04/28/12    HPI: Doing well after CABGx5 with postop a-fib, now NSR on amiodarone and coumadin Incisions healing well  Past Medical History  Diagnosis Date  . Hypertension   . Coronary artery disease   . Anginal pain   . Diabetes mellitus without complication   . Headache     Past Surgical History  Procedure Date  . Cardiac catheterization   . Inguinal hernia repair     right   . Coronary artery bypass graft 04/28/2012    Procedure: CORONARY ARTERY BYPASS GRAFTING (CABG);  Surgeon: [name], MD;  Location: [facility] OR;  Service: Open Heart Surgery;  Laterality: N/A;  Right saphenous vein used for bypass grafting. Five  bypasses performed including left mammary artery.     Family History  Problem Relation Age of Onset  . CAD Mother   . Heart attack Mother   . CAD Father   . Diabetes Father   . Heart attack Father     Social History History  Substance Use Topics  . Smoking status: Never Smoker   . Smokeless tobacco: Not on file  . Alcohol Use: Yes     Comment: maybe 1 beer q 2 weeks if that    Current Outpatient Prescriptions  Medication Sig Dispense Refill  . amiodarone (PACERONE) 200 MG tablet Take 1 tablet (200 mg total) by mouth daily.  30 tablet  3  . aspirin EC 81 MG tablet Take 81 mg by mouth daily.      Marland Kitchen atorvastatin (LIPITOR) 80 MG tablet Take 1 tablet (80 mg total) by mouth daily at 6 PM.  30 tablet  1  . fexofenadine (ALLEGRA) 180 MG tablet Take 180 mg by mouth daily. As needed for allergies      . fish oil-omega-3 fatty acids 1000 MG capsule Take 1 g by mouth daily.      Marland Kitchen glipiZIDE (GLUCOTROL XL) 5 MG 24 hr tablet Take 1 tablet (5 mg total) by mouth daily with breakfast.  30 tablet  1  . lisinopril (PRINIVIL,ZESTRIL) 5 MG tablet Take 1 tablet (5 mg total) by mouth daily.   30 tablet  3  . metoprolol succinate (TOPROL-XL) 25 MG 24 hr tablet Take 3 tablets (75 mg total) by mouth daily.  90 tablet  1  . oxyCODONE (OXY IR/ROXICODONE) 5 MG immediate release tablet Take 1-2 tablets (5-10 mg total) by mouth every 4 (four) hours as needed for pain.  30 tablet  0  . warfarin (COUMADIN) 5 MG tablet Take 1 tablet (5 mg total) by mouth daily at 6 PM. Or as directed  30 tablet  1    Allergies  Allergen Reactions  . Penicillins Other (See Comments)    Unknown-childhood reaction    Review of Systemsno fever , appetite better  BP 117/74  Pulse 62  Resp 18  Ht 6\' 4"  (1.93 m)  Wt 221 lb (100.245 kg)  BMI 26.90 kg/m2  SpO2 98% Physical Exam Lungs clear NSR  No edema Sternum healing well  Diagnostic Tests: CXR clear  Impression: Doing well  Plan: Return in 4-5 weeks

## 2012-05-31 ENCOUNTER — Ambulatory Visit (INDEPENDENT_AMBULATORY_CARE_PROVIDER_SITE_OTHER): Payer: BC Managed Care – PPO | Admitting: *Deleted

## 2012-05-31 DIAGNOSIS — Z7901 Long term (current) use of anticoagulants: Secondary | ICD-10-CM

## 2012-05-31 DIAGNOSIS — I4891 Unspecified atrial fibrillation: Secondary | ICD-10-CM

## 2012-05-31 LAB — POCT INR: INR: 2.4

## 2012-06-15 ENCOUNTER — Encounter (HOSPITAL_COMMUNITY)
Admission: RE | Admit: 2012-06-15 | Discharge: 2012-06-15 | Disposition: A | Discharge status: Home / Self Care | Payer: BC Managed Care – PPO | Source: Ambulatory Visit | Attending: Cardiology | Admitting: Cardiology

## 2012-06-15 DIAGNOSIS — Z5189 Encounter for other specified aftercare: Secondary | ICD-10-CM | POA: Insufficient documentation

## 2012-06-15 DIAGNOSIS — I10 Essential (primary) hypertension: Secondary | ICD-10-CM | POA: Insufficient documentation

## 2012-06-15 DIAGNOSIS — I5031 Acute diastolic (congestive) heart failure: Secondary | ICD-10-CM | POA: Insufficient documentation

## 2012-06-15 DIAGNOSIS — E119 Type 2 diabetes mellitus without complications: Secondary | ICD-10-CM | POA: Insufficient documentation

## 2012-06-15 DIAGNOSIS — I2510 Atherosclerotic heart disease of native coronary artery without angina pectoris: Secondary | ICD-10-CM | POA: Insufficient documentation

## 2012-06-16 ENCOUNTER — Ambulatory Visit (INDEPENDENT_AMBULATORY_CARE_PROVIDER_SITE_OTHER): Payer: BC Managed Care – PPO | Admitting: *Deleted

## 2012-06-16 DIAGNOSIS — I4891 Unspecified atrial fibrillation: Secondary | ICD-10-CM

## 2012-06-16 DIAGNOSIS — Z7901 Long term (current) use of anticoagulants: Secondary | ICD-10-CM

## 2012-06-16 LAB — POCT INR: INR: 1.7

## 2012-06-19 ENCOUNTER — Encounter (HOSPITAL_COMMUNITY)
Admission: RE | Admit: 2012-06-19 | Discharge: 2012-06-19 | Disposition: A | Discharge status: Home / Self Care | Payer: BC Managed Care – PPO | Source: Ambulatory Visit | Attending: Cardiology | Admitting: Cardiology

## 2012-06-19 LAB — GLUCOSE, CAPILLARY
Glucose-Capillary: 198 mg/dL — ABNORMAL HIGH (ref 70–99)
Glucose-Capillary: 126 mg/dL — ABNORMAL HIGH (ref 70–99)

## 2012-06-19 NOTE — Progress Notes (Signed)
Pt started cardiac rehab today.  Pt tolerated light exercise without difficulty. Telemetry rhythm Sinus with downward QRS which is previously documented. Vital signs stable.Will continue to monitor the patient throughout  the program.

## 2012-06-21 ENCOUNTER — Encounter (HOSPITAL_COMMUNITY)
Admission: RE | Admit: 2012-06-21 | Discharge: 2012-06-21 | Disposition: A | Discharge status: Home / Self Care | Payer: BC Managed Care – PPO | Source: Ambulatory Visit | Attending: Cardiology | Admitting: Cardiology

## 2012-06-21 LAB — GLUCOSE, CAPILLARY
Glucose-Capillary: 144 mg/dL — ABNORMAL HIGH (ref 70–99)
Glucose-Capillary: 139 mg/dL — ABNORMAL HIGH (ref 70–99)

## 2012-06-21 NOTE — Progress Notes (Signed)
Reviewed home exercise with pt today per request.  Pt plans to continue walking at home and at the mall for exercise.  Reviewed THR, pulse, RPE, sign and symptoms, and when to call 911 or MD.  Pt voiced understanding. Fabio Pierce, MA, ACSM RCEP

## 2012-06-21 NOTE — Progress Notes (Signed)
[name] [name] continues to do well with exercise at cardiac rehab.  Post exercise resting heart rate noted at 53 today post exercise. Will send ECG tracing for Dr Shirlee Latch to review.  [name] [name] has a one month follow up appointment with Dr Shirlee Latch tomorrow.

## 2012-06-22 ENCOUNTER — Encounter: Payer: Self-pay | Admitting: Cardiology

## 2012-06-22 ENCOUNTER — Ambulatory Visit: Payer: Self-pay | Admitting: Pharmacist

## 2012-06-22 ENCOUNTER — Encounter (INDEPENDENT_AMBULATORY_CARE_PROVIDER_SITE_OTHER): Payer: BC Managed Care – PPO

## 2012-06-22 ENCOUNTER — Ambulatory Visit (INDEPENDENT_AMBULATORY_CARE_PROVIDER_SITE_OTHER): Payer: BC Managed Care – PPO | Admitting: Cardiology

## 2012-06-22 VITALS — BP 126/70 | HR 68 | Ht 74.5 in | Wt 227.0 lb

## 2012-06-22 DIAGNOSIS — Z7901 Long term (current) use of anticoagulants: Secondary | ICD-10-CM

## 2012-06-22 DIAGNOSIS — I25810 Atherosclerosis of coronary artery bypass graft(s) without angina pectoris: Secondary | ICD-10-CM

## 2012-06-22 DIAGNOSIS — I4891 Unspecified atrial fibrillation: Secondary | ICD-10-CM

## 2012-06-22 DIAGNOSIS — R0989 Other specified symptoms and signs involving the circulatory and respiratory systems: Secondary | ICD-10-CM

## 2012-06-22 DIAGNOSIS — E7800 Pure hypercholesterolemia, unspecified: Secondary | ICD-10-CM

## 2012-06-22 NOTE — Patient Instructions (Addendum)
Stop amiodarone,   Stop warfarin.  Your physician recommends that you return for a FASTING lipid profile /liver profile in February 2014. This is scheduled for Tuesday February 11,2014. The lab opens at 7:30am. Do not eat or drink after midnight.   Your physician wants you to follow-up in: 4 months with Dr Shirlee Latch. (May 2014),  You will receive a reminder letter in the mail two months in advance. If you don't receive a letter, please call our office to schedule the follow-up appointment.

## 2012-06-22 NOTE — Progress Notes (Signed)
Patient ID: [name] [name], male   DOB: December 23, 1950, 62 y.o.   MRN: [id number] PCP: Tomi Bamberger  62 yo with history of CAD s/p recent CABG presents for cardiology followup.  Mr Serpe presented in 11/13 with unstable angina-type symptoms.  LHC showed severe 3VD.  CABG was done the next day. Post-operative course was complicated by atrial fibrillation that converted to NSR with amiodarone.  He is now on warfarin and amiodarone.  He has been feeling well in general since getting home.  He has some mild chest soreness at the surgical site.  No exertional dyspnea.  He has started cardiac rehab. No tachypalpitations.    Labs (11/13): K 4.5, creatinine 1.13 Labs (12/13): TSH normal, LFTs normal  PMH: 1. HTN 2. Type II DM 3. Hyperlipidemia 4. CAD: Unstable angina 11/13 with LHC showing severe 3 vessel disease.  He had CABG Donata Clay) with LIMA-LAD, SVG-D2, seq SVG-PLV/PDA, SVG-OM.  Echo (11/13) with EF 60%, mild LAE.  5. Atrial fibrillation: Post-op CABG.   SH: Semi-retired, does some landscaping work.  Married.  Nonsmoker, lives in [place].   FH: Mother and father both had CABG in their 61s.   ROS: All systems reviewed and negative except as per HPI.   Current Outpatient Prescriptions  Medication Sig Dispense Refill  . aspirin EC 81 MG tablet Take 81 mg by mouth daily.      Marland Kitchen atorvastatin (LIPITOR) 80 MG tablet Take 1 tablet (80 mg total) by mouth daily at 6 PM.  30 tablet  1  . fexofenadine (ALLEGRA) 180 MG tablet Take 180 mg by mouth daily. As needed for allergies      . fish oil-omega-3 fatty acids 1000 MG capsule Take 1 g by mouth daily.      Marland Kitchen lisinopril (PRINIVIL,ZESTRIL) 5 MG tablet Take 1 tablet (5 mg total) by mouth daily.  30 tablet  3  . metoprolol succinate (TOPROL-XL) 25 MG 24 hr tablet Take 3 tablets (75 mg total) by mouth daily.  90 tablet  1  . glipiZIDE (GLUCOTROL XL) 5 MG 24 hr tablet Take 1 tablet (5 mg total) by mouth daily with breakfast.  30 tablet  1   BP 126/70   Pulse 68  Ht 6' 2.5" (1.892 m)  Wt 227 lb (102.967 kg)  BMI 28.76 kg/m2 General: NAD Neck: No JVD, no thyromegaly or thyroid nodule.  Lungs: Clear to auscultation bilaterally with normal respiratory effort. CV: Nondisplaced PMI.  Heart regular S1/S2, no S3/S4, no murmur.  No peripheral edema.  No carotid bruit.  Normal pedal pulses.  Abdomen: Soft, nontender, no hepatosplenomegaly, no distention.  Neurologic: Alert and oriented x 3.  Psych: Normal affect. Extremities: No clubbing or cyanosis.   Assessment/Plan: 1. CAD: Stable s/p CABG.  Continue ASA 81, Toprol XL, atorva 80, lisinopril.  Continue cardiac rehab. 2. Atrial fibrillation: Post-op CABG.  He remains in NSR on amiodarone.  At this point with no recurrent symptoms, he can stop amiodarone and coumadin.  If he has a future recurrence, he will need long-term anticoagulation.  3. Hyperlipidemia: Continue atorvastatin, check lipids/LFTs in 2/14. 4. HTN: BP controlled.   Marca Ancona 06/22/2012

## 2012-06-23 ENCOUNTER — Encounter (HOSPITAL_COMMUNITY)
Admission: RE | Admit: 2012-06-23 | Discharge: 2012-06-23 | Disposition: A | Discharge status: Home / Self Care | Payer: BC Managed Care – PPO | Source: Ambulatory Visit | Attending: Cardiology | Admitting: Cardiology

## 2012-06-23 ENCOUNTER — Other Ambulatory Visit: Payer: Self-pay | Admitting: *Deleted

## 2012-06-23 DIAGNOSIS — I2510 Atherosclerotic heart disease of native coronary artery without angina pectoris: Secondary | ICD-10-CM

## 2012-06-23 LAB — GLUCOSE, CAPILLARY: Glucose-Capillary: 142 mg/dL — ABNORMAL HIGH (ref 70–99)

## 2012-06-26 ENCOUNTER — Encounter (HOSPITAL_COMMUNITY)
Admission: RE | Admit: 2012-06-26 | Discharge: 2012-06-26 | Disposition: A | Discharge status: Home / Self Care | Payer: BC Managed Care – PPO | Source: Ambulatory Visit | Attending: Cardiology | Admitting: Cardiology

## 2012-06-26 LAB — GLUCOSE, CAPILLARY: Glucose-Capillary: 128 mg/dL — ABNORMAL HIGH (ref 70–99)

## 2012-06-28 ENCOUNTER — Ambulatory Visit
Admission: RE | Admit: 2012-06-28 | Discharge: 2012-06-28 | Disposition: A | Discharge status: Home / Self Care | Payer: BC Managed Care – PPO | Source: Ambulatory Visit | Attending: Cardiothoracic Surgery | Admitting: Cardiothoracic Surgery

## 2012-06-28 ENCOUNTER — Encounter: Payer: Self-pay | Admitting: Cardiothoracic Surgery

## 2012-06-28 ENCOUNTER — Encounter (HOSPITAL_COMMUNITY): Payer: BC Managed Care – PPO

## 2012-06-28 ENCOUNTER — Ambulatory Visit (INDEPENDENT_AMBULATORY_CARE_PROVIDER_SITE_OTHER): Payer: Self-pay | Admitting: Cardiothoracic Surgery

## 2012-06-28 VITALS — BP 119/76 | HR 54 | Resp 18 | Ht 76.0 in | Wt 227.0 lb

## 2012-06-28 DIAGNOSIS — I2510 Atherosclerotic heart disease of native coronary artery without angina pectoris: Secondary | ICD-10-CM

## 2012-06-28 DIAGNOSIS — Z951 Presence of aortocoronary bypass graft: Secondary | ICD-10-CM

## 2012-06-28 NOTE — Progress Notes (Signed)
PCP is Tomi Bamberger, NP Referring Provider is [name], MD  Chief Complaint  Patient presents with  . Routine Post Op    5 week f/u with CXR , S/P CABG x5 on 04/28/12    HPI: Final postoperative office visit after CABG x5 mid November 2012. Patient continues to progress. Patient attending outpatient cardiac rehabilitation with improved performance. Surgical incisions all healing. No symptoms of angina or CHF. Maintaining sinus rhythm off amiodarone and Coumadin. 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   Past Medical History  Diagnosis Date  . Hypertension   . Coronary artery disease   . Anginal pain   . Diabetes mellitus without complication   . Headache     Past Surgical History  Procedure Date  . Cardiac catheterization   . Inguinal hernia repair     right   . Coronary artery bypass graft 04/28/2012    Procedure: CORONARY ARTERY BYPASS GRAFTING (CABG);  Surgeon: [name], MD;  Location: [facility] OR;  Service: Open Heart Surgery;  Laterality: N/A;  Right saphenous vein used for bypass grafting. Five  bypasses performed including left mammary artery.     Family History  Problem Relation Age of Onset  . CAD Mother   . Heart attack Mother   . CAD Father   . Diabetes Father   . Heart attack Father     Social History History  Substance Use Topics  . Smoking status: Never Smoker   . Smokeless tobacco: Not on file  . Alcohol Use: Yes     Comment: maybe 1 beer q 2 weeks if that    Current Outpatient Prescriptions  Medication Sig Dispense Refill  . aspirin EC 81 MG tablet Take 81 mg by mouth daily.      Marland Kitchen atorvastatin (LIPITOR) 80 MG tablet Take 1 tablet (80 mg total) by mouth daily at 6 PM.  30 tablet  1  . fexofenadine (ALLEGRA) 180 MG tablet Take 180 mg by mouth daily. As needed for allergies      . fish oil-omega-3 fatty acids 1000 MG capsule Take 1 g by mouth daily.      Marland Kitchen lisinopril (PRINIVIL,ZESTRIL) 5 MG  tablet Take 1 tablet (5 mg total) by mouth daily.  30 tablet  3  . metoprolol succinate (TOPROL-XL) 25 MG 24 hr tablet Take 3 tablets (75 mg total) by mouth daily.  90 tablet  1    Allergies  Allergen Reactions  . Penicillins Other (See Comments)    Unknown-childhood reaction    Review of Systems no fever improved appetite and overall strength    walking 45 minutes daily  BP 119/76  Pulse 54  Resp 18  Ht 6\' 4"  (1.93 m)  Wt 227 lb (102.967 kg)  BMI 27.63 kg/m2  SpO2 98% Physical Exam Alert and comfortable Sternal incision well-healed Cardiac rhythm regular without gallop or rub Lungs clear to auscultation No peripheral edema good peripheral pulses  Diagnostic Tests: Chest x-ray clear lung fields no pleural effusion  Impression: Excellent progression following CABG 2 months ago  Plan: Patient may lift up to 20 pounds until mid February and then proceed with further lifting as needed for his job Return here as needed

## 2012-06-30 ENCOUNTER — Other Ambulatory Visit: Payer: Self-pay | Admitting: Cardiology

## 2012-06-30 ENCOUNTER — Encounter (HOSPITAL_COMMUNITY)
Admission: RE | Admit: 2012-06-30 | Discharge: 2012-06-30 | Disposition: A | Discharge status: Home / Self Care | Payer: BC Managed Care – PPO | Source: Ambulatory Visit | Attending: Cardiology | Admitting: Cardiology

## 2012-06-30 NOTE — Telephone Encounter (Signed)
Pt's wife calling said [facility] requested refill three days ago and again today, pt now out and needs it called in or won't have any for the weekend, metoprolol and atorvastatin

## 2012-07-03 ENCOUNTER — Encounter (HOSPITAL_COMMUNITY)
Admission: RE | Admit: 2012-07-03 | Discharge: 2012-07-03 | Disposition: A | Discharge status: Home / Self Care | Payer: BC Managed Care – PPO | Source: Ambulatory Visit | Attending: Cardiology | Admitting: Cardiology

## 2012-07-05 ENCOUNTER — Encounter (HOSPITAL_COMMUNITY)
Admission: RE | Admit: 2012-07-05 | Discharge: 2012-07-05 | Disposition: A | Discharge status: Home / Self Care | Payer: BC Managed Care – PPO | Source: Ambulatory Visit | Attending: Cardiology | Admitting: Cardiology

## 2012-07-07 ENCOUNTER — Encounter (HOSPITAL_COMMUNITY)
Admission: RE | Admit: 2012-07-07 | Discharge: 2012-07-07 | Disposition: A | Discharge status: Home / Self Care | Payer: BC Managed Care – PPO | Source: Ambulatory Visit | Attending: Cardiology | Admitting: Cardiology

## 2012-07-10 ENCOUNTER — Encounter (HOSPITAL_COMMUNITY)
Admission: RE | Admit: 2012-07-10 | Discharge: 2012-07-10 | Disposition: A | Discharge status: Home / Self Care | Payer: BC Managed Care – PPO | Source: Ambulatory Visit | Attending: Cardiology | Admitting: Cardiology

## 2012-07-12 ENCOUNTER — Encounter (HOSPITAL_COMMUNITY): Payer: BC Managed Care – PPO

## 2012-07-14 ENCOUNTER — Encounter (HOSPITAL_COMMUNITY)
Admission: RE | Admit: 2012-07-14 | Discharge: 2012-07-14 | Disposition: A | Discharge status: Home / Self Care | Payer: BC Managed Care – PPO | Source: Ambulatory Visit | Attending: Cardiology | Admitting: Cardiology

## 2012-07-17 ENCOUNTER — Encounter (HOSPITAL_COMMUNITY)
Admission: RE | Admit: 2012-07-17 | Discharge: 2012-07-17 | Disposition: A | Discharge status: Home / Self Care | Payer: BC Managed Care – PPO | Source: Ambulatory Visit | Attending: Cardiology | Admitting: Cardiology

## 2012-07-17 DIAGNOSIS — I10 Essential (primary) hypertension: Secondary | ICD-10-CM | POA: Insufficient documentation

## 2012-07-17 DIAGNOSIS — I2510 Atherosclerotic heart disease of native coronary artery without angina pectoris: Secondary | ICD-10-CM | POA: Insufficient documentation

## 2012-07-17 DIAGNOSIS — Z5189 Encounter for other specified aftercare: Secondary | ICD-10-CM | POA: Insufficient documentation

## 2012-07-17 DIAGNOSIS — E119 Type 2 diabetes mellitus without complications: Secondary | ICD-10-CM | POA: Insufficient documentation

## 2012-07-17 DIAGNOSIS — I5031 Acute diastolic (congestive) heart failure: Secondary | ICD-10-CM | POA: Insufficient documentation

## 2012-07-19 ENCOUNTER — Encounter (HOSPITAL_COMMUNITY)
Admission: RE | Admit: 2012-07-19 | Discharge: 2012-07-19 | Disposition: A | Discharge status: Home / Self Care | Payer: BC Managed Care – PPO | Source: Ambulatory Visit | Attending: Cardiology | Admitting: Cardiology

## 2012-07-21 ENCOUNTER — Encounter (HOSPITAL_COMMUNITY)
Admission: RE | Admit: 2012-07-21 | Discharge: 2012-07-21 | Disposition: A | Discharge status: Home / Self Care | Payer: BC Managed Care – PPO | Source: Ambulatory Visit | Attending: Cardiology | Admitting: Cardiology

## 2012-07-24 ENCOUNTER — Encounter (HOSPITAL_COMMUNITY)
Admission: RE | Admit: 2012-07-24 | Discharge: 2012-07-24 | Disposition: A | Discharge status: Home / Self Care | Payer: BC Managed Care – PPO | Source: Ambulatory Visit | Attending: Cardiology | Admitting: Cardiology

## 2012-07-25 ENCOUNTER — Other Ambulatory Visit (INDEPENDENT_AMBULATORY_CARE_PROVIDER_SITE_OTHER): Payer: BC Managed Care – PPO

## 2012-07-25 DIAGNOSIS — I25810 Atherosclerosis of coronary artery bypass graft(s) without angina pectoris: Secondary | ICD-10-CM

## 2012-07-25 LAB — HEPATIC FUNCTION PANEL
ALT: 38 U/L (ref 0–53)
AST: 27 U/L (ref 0–37)
Albumin: 4.1 g/dL (ref 3.5–5.2)
Alkaline Phosphatase: 82 U/L (ref 39–117)
Bilirubin, Direct: 0 mg/dL (ref 0.0–0.3)
Total Bilirubin: 0.6 mg/dL (ref 0.3–1.2)
Total Protein: 7.6 g/dL (ref 6.0–8.3)

## 2012-07-25 LAB — LIPID PANEL
Cholesterol: 115 mg/dL (ref 0–200)
HDL: 39.1 mg/dL (ref >39.00)
LDL Cholesterol: 59 mg/dL (ref 0–99)
Total CHOL/HDL Ratio: 3
Triglycerides: 87 mg/dL (ref 0.0–149.0)
VLDL: 17.4 mg/dL (ref 0.0–40.0)

## 2012-07-26 ENCOUNTER — Encounter (HOSPITAL_COMMUNITY)
Admission: RE | Admit: 2012-07-26 | Discharge: 2012-07-26 | Disposition: A | Discharge status: Home / Self Care | Payer: BC Managed Care – PPO | Source: Ambulatory Visit | Attending: Cardiology | Admitting: Cardiology

## 2012-07-28 ENCOUNTER — Encounter (HOSPITAL_COMMUNITY): Payer: BC Managed Care – PPO

## 2012-07-31 ENCOUNTER — Encounter (HOSPITAL_COMMUNITY)
Admission: RE | Admit: 2012-07-31 | Discharge: 2012-07-31 | Disposition: A | Discharge status: Home / Self Care | Payer: BC Managed Care – PPO | Source: Ambulatory Visit | Attending: Cardiology | Admitting: Cardiology

## 2012-08-02 ENCOUNTER — Encounter (HOSPITAL_COMMUNITY)
Admission: RE | Admit: 2012-08-02 | Discharge: 2012-08-02 | Disposition: A | Discharge status: Home / Self Care | Payer: BC Managed Care – PPO | Source: Ambulatory Visit | Attending: Cardiology | Admitting: Cardiology

## 2012-08-04 ENCOUNTER — Encounter (HOSPITAL_COMMUNITY)
Admission: RE | Admit: 2012-08-04 | Discharge: 2012-08-04 | Disposition: A | Discharge status: Home / Self Care | Payer: BC Managed Care – PPO | Source: Ambulatory Visit | Attending: Cardiology | Admitting: Cardiology

## 2012-08-07 ENCOUNTER — Encounter (HOSPITAL_COMMUNITY)
Admission: RE | Admit: 2012-08-07 | Discharge: 2012-08-07 | Disposition: A | Discharge status: Home / Self Care | Payer: BC Managed Care – PPO | Source: Ambulatory Visit | Attending: Cardiology | Admitting: Cardiology

## 2012-08-09 ENCOUNTER — Encounter (HOSPITAL_COMMUNITY)
Admission: RE | Admit: 2012-08-09 | Discharge: 2012-08-09 | Disposition: A | Discharge status: Home / Self Care | Payer: BC Managed Care – PPO | Source: Ambulatory Visit | Attending: Cardiology | Admitting: Cardiology

## 2012-08-09 NOTE — Progress Notes (Signed)
Dian Queen 62 y.o. male Nutrition Note Spoke with pt.  Nutrition Plan and Nutrition Survey goals reviewed with pt. Pt is following Step 2 of the Therapeutic Lifestyle Changes diet. Pt wants to maintain his current wt. Pt is a diet-controlled diabetic. Last A1c indicates blood glucose well-controlled. This Clinical research associate went over Diabetes Education test results. Pt checks CBG's every 3 days. Fasting CBG's reportedly 90-130 mg/dL. Pt unaware of recommended fasting CBG range. Recommended fasting CBG range for DM reviewed. Pt reports his Coumadin was discontinued. Pt expressed understanding of the information reviewed. Pt aware of nutrition education classes offered and is unable to attend nutrition classes because "I don't like class settings."   Nutrition Diagnosis   Food-and nutrition-related knowledge deficit related to lack of exposure to information as related to diagnosis of: ? CVD ? DM (A1c 6.1)   Nutrition RX/ Estimated Daily Nutrition Needs for: wt maintenance 2650-3050 Kcal, 85-100 gm fat, 15-20 gm sat fat, 2.6-3.1 gm trans-fat, <1500 mg sodium, 325 gm CHO   Nutrition Intervention   Pt's individual nutrition plan reviewed with pt.   Benefits of adopting Therapeutic Lifestyle Changes discussed when Medficts reviewed.   Pt to attend the Portion Distortion class   Pt given handouts for: ? Nutrition I class ? Nutrition II class ? Diabetes Blitz class   Continue client-centered nutrition education by RD, as part of interdisciplinary care.  Goal(s)   Pt to describe the benefit of including fruits, vegetables, whole grains, and low-fat dairy products in a heart healthy meal plan.  Monitor and Evaluate progress toward nutrition goal with team. Nutrition Risk: Change to Moderate

## 2012-08-11 ENCOUNTER — Encounter (HOSPITAL_COMMUNITY)
Admission: RE | Admit: 2012-08-11 | Discharge: 2012-08-11 | Disposition: A | Discharge status: Home / Self Care | Payer: BC Managed Care – PPO | Source: Ambulatory Visit | Attending: Cardiology | Admitting: Cardiology

## 2012-08-14 ENCOUNTER — Encounter (HOSPITAL_COMMUNITY)
Admission: RE | Admit: 2012-08-14 | Discharge: 2012-08-14 | Disposition: A | Discharge status: Home / Self Care | Payer: BC Managed Care – PPO | Source: Ambulatory Visit | Attending: Cardiology | Admitting: Cardiology

## 2012-08-14 DIAGNOSIS — E119 Type 2 diabetes mellitus without complications: Secondary | ICD-10-CM | POA: Insufficient documentation

## 2012-08-14 DIAGNOSIS — I2510 Atherosclerotic heart disease of native coronary artery without angina pectoris: Secondary | ICD-10-CM | POA: Insufficient documentation

## 2012-08-14 DIAGNOSIS — Z5189 Encounter for other specified aftercare: Secondary | ICD-10-CM | POA: Insufficient documentation

## 2012-08-14 DIAGNOSIS — I10 Essential (primary) hypertension: Secondary | ICD-10-CM | POA: Insufficient documentation

## 2012-08-14 DIAGNOSIS — I5031 Acute diastolic (congestive) heart failure: Secondary | ICD-10-CM | POA: Insufficient documentation

## 2012-08-16 ENCOUNTER — Encounter (HOSPITAL_COMMUNITY)
Admission: RE | Admit: 2012-08-16 | Discharge: 2012-08-16 | Disposition: A | Discharge status: Home / Self Care | Payer: BC Managed Care – PPO | Source: Ambulatory Visit | Attending: Cardiology | Admitting: Cardiology

## 2012-08-18 ENCOUNTER — Encounter (HOSPITAL_COMMUNITY): Payer: BC Managed Care – PPO

## 2012-08-21 ENCOUNTER — Encounter (HOSPITAL_COMMUNITY)
Admission: RE | Admit: 2012-08-21 | Discharge: 2012-08-21 | Disposition: A | Discharge status: Home / Self Care | Payer: BC Managed Care – PPO | Source: Ambulatory Visit | Attending: Cardiology | Admitting: Cardiology

## 2012-08-23 ENCOUNTER — Encounter (HOSPITAL_COMMUNITY)
Admission: RE | Admit: 2012-08-23 | Discharge: 2012-08-23 | Disposition: A | Discharge status: Home / Self Care | Payer: BC Managed Care – PPO | Source: Ambulatory Visit | Attending: Cardiology | Admitting: Cardiology

## 2012-08-25 ENCOUNTER — Encounter (HOSPITAL_COMMUNITY)
Admission: RE | Admit: 2012-08-25 | Discharge: 2012-08-25 | Disposition: A | Discharge status: Home / Self Care | Payer: BC Managed Care – PPO | Source: Ambulatory Visit | Attending: Cardiology | Admitting: Cardiology

## 2012-08-28 ENCOUNTER — Encounter (HOSPITAL_COMMUNITY)
Admission: RE | Admit: 2012-08-28 | Discharge: 2012-08-28 | Disposition: A | Discharge status: Home / Self Care | Payer: BC Managed Care – PPO | Source: Ambulatory Visit | Attending: Cardiology | Admitting: Cardiology

## 2012-08-30 ENCOUNTER — Encounter (HOSPITAL_COMMUNITY)
Admission: RE | Admit: 2012-08-30 | Discharge: 2012-08-30 | Disposition: A | Discharge status: Home / Self Care | Payer: BC Managed Care – PPO | Source: Ambulatory Visit | Attending: Cardiology | Admitting: Cardiology

## 2012-09-01 ENCOUNTER — Encounter (HOSPITAL_COMMUNITY)
Admission: RE | Admit: 2012-09-01 | Discharge: 2012-09-01 | Disposition: A | Discharge status: Home / Self Care | Payer: BC Managed Care – PPO | Source: Ambulatory Visit | Attending: Cardiology | Admitting: Cardiology

## 2012-09-04 ENCOUNTER — Encounter (HOSPITAL_COMMUNITY)
Admission: RE | Admit: 2012-09-04 | Discharge: 2012-09-04 | Disposition: A | Discharge status: Home / Self Care | Payer: BC Managed Care – PPO | Source: Ambulatory Visit | Attending: Cardiology | Admitting: Cardiology

## 2012-09-06 ENCOUNTER — Encounter (HOSPITAL_COMMUNITY)
Admission: RE | Admit: 2012-09-06 | Discharge: 2012-09-06 | Disposition: A | Discharge status: Home / Self Care | Payer: BC Managed Care – PPO | Source: Ambulatory Visit | Attending: Cardiology | Admitting: Cardiology

## 2012-09-08 ENCOUNTER — Encounter (HOSPITAL_COMMUNITY)
Admission: RE | Admit: 2012-09-08 | Discharge: 2012-09-08 | Disposition: A | Discharge status: Home / Self Care | Payer: BC Managed Care – PPO | Source: Ambulatory Visit | Attending: Cardiology | Admitting: Cardiology

## 2012-09-11 ENCOUNTER — Encounter (HOSPITAL_COMMUNITY)
Admission: RE | Admit: 2012-09-11 | Discharge: 2012-09-11 | Disposition: A | Discharge status: Home / Self Care | Payer: BC Managed Care – PPO | Source: Ambulatory Visit | Attending: Cardiology | Admitting: Cardiology

## 2012-09-13 ENCOUNTER — Encounter (HOSPITAL_COMMUNITY)
Admission: RE | Admit: 2012-09-13 | Discharge: 2012-09-13 | Disposition: A | Discharge status: Home / Self Care | Payer: BC Managed Care – PPO | Source: Ambulatory Visit | Attending: Cardiology | Admitting: Cardiology

## 2012-09-13 DIAGNOSIS — I5031 Acute diastolic (congestive) heart failure: Secondary | ICD-10-CM | POA: Insufficient documentation

## 2012-09-13 DIAGNOSIS — I2510 Atherosclerotic heart disease of native coronary artery without angina pectoris: Secondary | ICD-10-CM | POA: Insufficient documentation

## 2012-09-13 DIAGNOSIS — I10 Essential (primary) hypertension: Secondary | ICD-10-CM | POA: Insufficient documentation

## 2012-09-13 DIAGNOSIS — Z5189 Encounter for other specified aftercare: Secondary | ICD-10-CM | POA: Insufficient documentation

## 2012-09-13 DIAGNOSIS — E119 Type 2 diabetes mellitus without complications: Secondary | ICD-10-CM | POA: Insufficient documentation

## 2012-09-15 ENCOUNTER — Encounter (HOSPITAL_COMMUNITY)
Admission: RE | Admit: 2012-09-15 | Discharge: 2012-09-15 | Disposition: A | Discharge status: Home / Self Care | Payer: BC Managed Care – PPO | Source: Ambulatory Visit | Attending: Cardiology | Admitting: Cardiology

## 2012-09-17 ENCOUNTER — Other Ambulatory Visit: Payer: Self-pay | Admitting: Cardiology

## 2012-09-18 ENCOUNTER — Encounter (HOSPITAL_COMMUNITY)
Admission: RE | Admit: 2012-09-18 | Discharge: 2012-09-18 | Disposition: A | Discharge status: Home / Self Care | Payer: BC Managed Care – PPO | Source: Ambulatory Visit | Attending: Cardiology | Admitting: Cardiology

## 2012-09-18 NOTE — Progress Notes (Signed)
[employer] today.  We encouraged [name] [name] to continue exercise on his own.

## 2012-09-20 ENCOUNTER — Encounter (HOSPITAL_COMMUNITY): Payer: BC Managed Care – PPO

## 2012-09-22 ENCOUNTER — Encounter (HOSPITAL_COMMUNITY): Payer: BC Managed Care – PPO

## 2012-10-23 ENCOUNTER — Encounter: Payer: Self-pay | Admitting: Cardiology

## 2012-10-23 ENCOUNTER — Ambulatory Visit (INDEPENDENT_AMBULATORY_CARE_PROVIDER_SITE_OTHER): Payer: BC Managed Care – PPO | Admitting: Cardiology

## 2012-10-23 VITALS — BP 126/60 | HR 53 | Ht 76.0 in | Wt 232.0 lb

## 2012-10-23 DIAGNOSIS — I25810 Atherosclerosis of coronary artery bypass graft(s) without angina pectoris: Secondary | ICD-10-CM

## 2012-10-23 DIAGNOSIS — E7800 Pure hypercholesterolemia, unspecified: Secondary | ICD-10-CM

## 2012-10-23 MED ORDER — CO Q 10 100 MG PO CAPS: 200.0000 mg | ORAL_CAPSULE | Freq: Every day | ORAL | Status: AC

## 2012-10-23 NOTE — Patient Instructions (Addendum)
**Note De-Identified Nimah Uphoff Obfuscation** Your physician has recommended you make the following change in your medication: start taking Co Q 10 200 mg daily  Your physician recommends that you return for lab work in: 6 months  Your physician wants you to follow-up in: 6 months. You will receive a reminder letter in the mail two months in advance. If you don't receive a letter, please call our office to schedule the follow-up appointment.

## 2012-10-23 NOTE — Progress Notes (Signed)
Patient ID: [name] [name], male   DOB: [birth year], 62 y.o.   MRN: [id number] PCP: Tomi Bamberger  62 yo with history of CAD s/p recent CABG presents for cardiology followup.  [name] [name] presented in 11/13 with unstable angina-type symptoms.  LHC showed severe 3VD.  CABG was done the next day. Post-operative course was complicated by atrial fibrillation that converted to NSR with amiodarone.  Atrial fibrillation has not recurred post-op, and he is off amiodarone and coumadin.  He completed cardiac rehab.  No chest pain.  He gets mildly short of breath with heavy exertion on occasion, but usually no problems.  No tachypalpitations.  Occasional mild aches that he attributes to atorvastatin.   Labs (11/13): K 4.5, creatinine 1.13 Labs (12/13): TSH normal, LFTs normal Labs (2/14): LDL 59, HDL 39  PMH: 1. HTN 2. Type II DM 3. Hyperlipidemia 4. CAD: Unstable angina 11/13 with LHC showing severe 3 vessel disease.  He had CABG Donata Clay) with LIMA-LAD, SVG-D2, seq SVG-PLV/PDA, SVG-OM.  Echo (11/13) with EF 60%, mild LAE.  5. Atrial fibrillation: Post-op CABG.   SH: Semi-retired, does some landscaping work.  Married.  Nonsmoker, lives in [place].   FH: Mother and father both had CABG in their 3s.    Current Outpatient Prescriptions  Medication Sig Dispense Refill  . aspirin EC 81 MG tablet Take 81 mg by mouth daily.      Marland Kitchen atorvastatin (LIPITOR) 80 MG tablet take 1 tablet by mouth once daily AT 6 PM  30 tablet  3  . fexofenadine (ALLEGRA) 180 MG tablet Take 180 mg by mouth daily. As needed for allergies      . fish oil-omega-3 fatty acids 1000 MG capsule Take 1 g by mouth daily.      Marland Kitchen lisinopril (PRINIVIL,ZESTRIL) 5 MG tablet take 1 tablet by mouth once daily  30 tablet  3  . metoprolol succinate (TOPROL-XL) 25 MG 24 hr tablet take 3 tablets by mouth once daily  90 tablet  3   No current facility-administered medications for this visit.   BP 126/60  Pulse 53  Ht 6\' 4"  (1.93 m)  Wt 232  lb (105.235 kg)  BMI 28.25 kg/m2  SpO2 98% General: NAD Neck: No JVD, no thyromegaly or thyroid nodule.  Lungs: Clear to auscultation bilaterally with normal respiratory effort. CV: Nondisplaced PMI.  Heart regular S1/S2, no S3/S4, no murmur.  No peripheral edema.  No carotid bruit.  Normal pedal pulses.  Abdomen: Soft, nontender, no hepatosplenomegaly, no distention.  Neurologic: Alert and oriented x 3.  Psych: Normal affect. Extremities: No clubbing or cyanosis.   Assessment/Plan: 1. CAD: Stable s/p CABG.  Continue ASA 81, Toprol XL, atorva 80, lisinopril.  He has finished cardiac rehab.  I would like him to walk for exercise at least 5 days a week. 2. Atrial fibrillation: Post-op CABG.  No atrial fibrillation documented since post-op period.  He is not on coumadin.  If he has a future recurrence, he will need long-term anticoagulation.  3. Hyperlipidemia: Continue atorvastatin, check lipids/LFTs at followup in 6 months.  He can try coenzyme Q10 200 mg daily for mild aches and pains.  4. HTN: BP controlled.   Marca Ancona 10/23/2012

## 2012-11-03 ENCOUNTER — Other Ambulatory Visit: Payer: Self-pay | Admitting: Cardiology

## 2012-11-16 ENCOUNTER — Other Ambulatory Visit: Payer: Self-pay | Admitting: Cardiology

## 2013-01-23 ENCOUNTER — Other Ambulatory Visit: Payer: Self-pay | Admitting: Cardiology

## 2013-05-07 ENCOUNTER — Other Ambulatory Visit: Payer: Self-pay | Admitting: Cardiology

## 2013-05-16 ENCOUNTER — Other Ambulatory Visit: Payer: Self-pay | Admitting: Cardiology

## 2013-05-22 ENCOUNTER — Other Ambulatory Visit: Payer: Self-pay

## 2013-05-22 DIAGNOSIS — E7800 Pure hypercholesterolemia, unspecified: Secondary | ICD-10-CM

## 2013-05-22 LAB — LIPID PANEL
Cholesterol: 107 mg/dL (ref 0–200)
HDL: 38.1 mg/dL — ABNORMAL LOW (ref >39.00)
LDL Cholesterol: 56 mg/dL (ref 0–99)
Total CHOL/HDL Ratio: 3
Triglycerides: 63 mg/dL (ref 0.0–149.0)
VLDL: 12.6 mg/dL (ref 0.0–40.0)

## 2013-05-22 LAB — HEPATIC FUNCTION PANEL
ALT: 31 U/L (ref 0–53)
AST: 29 U/L (ref 0–37)
Albumin: 4.2 g/dL (ref 3.5–5.2)
Alkaline Phosphatase: 78 U/L (ref 39–117)
Bilirubin, Direct: 0.1 mg/dL (ref 0.0–0.3)
Total Bilirubin: 0.7 mg/dL (ref 0.3–1.2)
Total Protein: 7.5 g/dL (ref 6.0–8.3)

## 2013-05-24 ENCOUNTER — Ambulatory Visit (INDEPENDENT_AMBULATORY_CARE_PROVIDER_SITE_OTHER): Payer: Self-pay | Admitting: Cardiology

## 2013-05-24 ENCOUNTER — Encounter: Payer: Self-pay | Admitting: Cardiology

## 2013-05-24 VITALS — BP 128/72 | HR 55 | Ht 76.0 in | Wt 240.0 lb

## 2013-05-24 DIAGNOSIS — I25810 Atherosclerosis of coronary artery bypass graft(s) without angina pectoris: Secondary | ICD-10-CM

## 2013-05-24 DIAGNOSIS — E7800 Pure hypercholesterolemia, unspecified: Secondary | ICD-10-CM

## 2013-05-24 MED ORDER — METOPROLOL TARTRATE 50 MG PO TABS: 50.0000 mg | ORAL_TABLET | Freq: Every day | ORAL | Status: DC

## 2013-05-24 NOTE — Patient Instructions (Signed)
Decrease Toprol XL to 50mg  daily.   Your physician wants you to follow-up in: 1 year with Dr Shirlee Latch. (December 2015). You will receive a reminder letter in the mail two months in advance. If you don't receive a letter, please call our office to schedule the follow-up appointment.

## 2013-05-24 NOTE — Progress Notes (Signed)
Patient ID: [name] [name], male   DOB: 1951-04-24, 62 y.o.   MRN: [id number] PCP: Tomi Bamberger  62 yo with history of CAD s/p recent CABG presents for cardiology followup.  Mr [name] presented in 11/13 with unstable angina-type symptoms.  LHC showed severe 3VD.  CABG was done the next day. Post-operative course was complicated by atrial fibrillation that converted to NSR with amiodarone.  Atrial fibrillation has not recurred post-op, and he is off amiodarone and coumadin.  No exertional chest pain or dyspnea.  No palpitations.  He walks for exercise.     Labs (11/13): K 4.5, creatinine 1.13 Labs (12/13): TSH normal, LFTs normal Labs (2/14): LDL 59, HDL 39 Labs (12/14): LDL 56, HDL 38  ECG; NSR, LVH, left axis deviation, old lateral MI  PMH: 1. HTN 2. Type II DM 3. Hyperlipidemia 4. CAD: Unstable angina 11/13 with LHC showing severe 3 vessel disease.  He had CABG Donata Clay) with LIMA-LAD, SVG-D2, seq SVG-PLV/PDA, SVG-OM.  Echo (11/13) with EF 60%, mild LAE.  5. Atrial fibrillation: Post-op CABG.   SH: Semi-retired, does some landscaping work.  Married.  Nonsmoker, lives in [place].   FH: Mother and father both had CABG in their 61s.    Current Outpatient Prescriptions  Medication Sig Dispense Refill  . aspirin EC 81 MG tablet Take 81 mg by mouth daily.      Marland Kitchen atorvastatin (LIPITOR) 80 MG tablet take 1 tablet by mouth once daily AT 6 PM  30 tablet  0  . Coenzyme Q10 (CO Q 10) 100 MG CAPS Take 200 mg by mouth daily.  30 capsule  6  . fexofenadine (ALLEGRA) 180 MG tablet Take 180 mg by mouth daily. As needed for allergies      . fish oil-omega-3 fatty acids 1000 MG capsule Take 1 g by mouth daily.      Marland Kitchen lisinopril (PRINIVIL,ZESTRIL) 5 MG tablet take 1 tablet by mouth once daily  30 tablet  5  . metoprolol succinate (TOPROL-XL) 25 MG 24 hr tablet take 3 tablets by mouth once daily  90 tablet  5  . metoprolol (LOPRESSOR) 50 MG tablet Take 1 tablet (50 mg total) by mouth daily.  90  tablet  3   No current facility-administered medications for this visit.   BP 128/72  Pulse 55  Ht 6\' 4"  (1.93 m)  Wt 108.863 kg (240 lb)  BMI 29.23 kg/m2 General: NAD Neck: No JVD, no thyromegaly or thyroid nodule.  Lungs: Clear to auscultation bilaterally with normal respiratory effort. CV: Nondisplaced PMI.  Heart regular S1/S2, no S3/S4, no murmur.  No peripheral edema.  No carotid bruit.  Normal pedal pulses.  Abdomen: Soft, nontender, no hepatosplenomegaly, no distention.  Neurologic: Alert and oriented x 3.  Psych: Normal affect. Extremities: No clubbing or cyanosis.   Assessment/Plan: 1. CAD: Stable s/p CABG.  Continue ASA 81, atorva 80, lisinopril.  I think it would be ok to decrease Toprol XL to 50 mg daly.  2. Atrial fibrillation: Post-op CABG.  No atrial fibrillation documented since post-op period.  He is not on coumadin.  If he has a future recurrence, he will need long-term anticoagulation.  3. Hyperlipidemia: Continue atorvastatin, good lipids in 12/14.   4. HTN: BP controlled.   Marca Ancona 05/24/2013

## 2013-06-08 ENCOUNTER — Other Ambulatory Visit: Payer: Self-pay | Admitting: Cardiology

## 2013-07-27 ENCOUNTER — Other Ambulatory Visit: Payer: Self-pay | Admitting: Cardiology

## 2013-12-16 ENCOUNTER — Observation Stay (HOSPITAL_COMMUNITY)
Admission: EM | Admit: 2013-12-16 | Discharge: 2013-12-18 | Disposition: A | Discharge status: Home / Self Care | Payer: BC Managed Care – PPO | Attending: Cardiology | Admitting: Cardiology

## 2013-12-16 ENCOUNTER — Emergency Department (HOSPITAL_COMMUNITY): Payer: BC Managed Care – PPO

## 2013-12-16 ENCOUNTER — Encounter (HOSPITAL_COMMUNITY): Payer: Self-pay | Admitting: Emergency Medicine

## 2013-12-16 DIAGNOSIS — E119 Type 2 diabetes mellitus without complications: Secondary | ICD-10-CM

## 2013-12-16 DIAGNOSIS — Z880 Allergy status to penicillin: Secondary | ICD-10-CM | POA: Insufficient documentation

## 2013-12-16 DIAGNOSIS — I200 Unstable angina: Secondary | ICD-10-CM | POA: Insufficient documentation

## 2013-12-16 DIAGNOSIS — I4510 Unspecified right bundle-branch block: Secondary | ICD-10-CM | POA: Insufficient documentation

## 2013-12-16 DIAGNOSIS — I2510 Atherosclerotic heart disease of native coronary artery without angina pectoris: Secondary | ICD-10-CM | POA: Insufficient documentation

## 2013-12-16 DIAGNOSIS — I25708 Atherosclerosis of coronary artery bypass graft(s), unspecified, with other forms of angina pectoris: Secondary | ICD-10-CM

## 2013-12-16 DIAGNOSIS — Z7982 Long term (current) use of aspirin: Secondary | ICD-10-CM | POA: Insufficient documentation

## 2013-12-16 DIAGNOSIS — E7800 Pure hypercholesterolemia, unspecified: Secondary | ICD-10-CM

## 2013-12-16 DIAGNOSIS — R079 Chest pain, unspecified: Secondary | ICD-10-CM

## 2013-12-16 DIAGNOSIS — E1169 Type 2 diabetes mellitus with other specified complication: Secondary | ICD-10-CM | POA: Diagnosis present

## 2013-12-16 DIAGNOSIS — R9431 Abnormal electrocardiogram [ECG] [EKG]: Secondary | ICD-10-CM

## 2013-12-16 DIAGNOSIS — I252 Old myocardial infarction: Secondary | ICD-10-CM | POA: Insufficient documentation

## 2013-12-16 DIAGNOSIS — I10 Essential (primary) hypertension: Secondary | ICD-10-CM

## 2013-12-16 DIAGNOSIS — R0789 Other chest pain: Secondary | ICD-10-CM

## 2013-12-16 DIAGNOSIS — I25810 Atherosclerosis of coronary artery bypass graft(s) without angina pectoris: Principal | ICD-10-CM | POA: Insufficient documentation

## 2013-12-16 DIAGNOSIS — I25709 Atherosclerosis of coronary artery bypass graft(s), unspecified, with unspecified angina pectoris: Secondary | ICD-10-CM

## 2013-12-16 DIAGNOSIS — E785 Hyperlipidemia, unspecified: Secondary | ICD-10-CM

## 2013-12-16 LAB — CBC
HCT: 40.6 % (ref 39.0–52.0)
Hemoglobin: 14 g/dL (ref 13.0–17.0)
MCH: 30.8 pg (ref 26.0–34.0)
MCHC: 34.5 g/dL (ref 30.0–36.0)
MCV: 89.4 fL (ref 78.0–100.0)
Platelets: 166 10*3/uL (ref 150–400)
RBC: 4.54 MIL/uL (ref 4.22–5.81)
RDW: 13 % (ref 11.5–15.5)
WBC: 7.6 10*3/uL (ref 4.0–10.5)

## 2013-12-16 LAB — BASIC METABOLIC PANEL
Anion gap: 13 (ref 5–15)
BUN: 16 mg/dL (ref 6–23)
CO2: 28 mEq/L (ref 19–32)
Calcium: 9.8 mg/dL (ref 8.4–10.5)
Chloride: 98 mEq/L (ref 96–112)
Creatinine, Ser: 1.15 mg/dL (ref 0.50–1.35)
GFR calc Af Amer: 77 mL/min — ABNORMAL LOW (ref >90)
GFR calc non Af Amer: 66 mL/min — ABNORMAL LOW (ref >90)
Glucose, Bld: 151 mg/dL — ABNORMAL HIGH (ref 70–99)
Potassium: 4.3 mEq/L (ref 3.7–5.3)
Sodium: 139 mEq/L (ref 137–147)

## 2013-12-16 LAB — I-STAT TROPONIN, ED: Troponin i, poc: 0 ng/mL (ref 0.00–0.08)

## 2013-12-16 MED ORDER — ACETAMINOPHEN 325 MG PO TABS: 650.0000 mg | ORAL_TABLET | ORAL | Status: DC | PRN

## 2013-12-16 MED ORDER — ASPIRIN 81 MG PO CHEW
324.0000 mg | CHEWABLE_TABLET | Freq: Once | ORAL | Status: AC
  Administered 2013-12-16: 324 mg via ORAL
  Filled 2013-12-16: qty 4

## 2013-12-16 MED ORDER — HEPARIN SODIUM (PORCINE) 5000 UNIT/ML IJ SOLN
5000.0000 [IU] | Freq: Three times a day (TID) | INTRAMUSCULAR | Status: DC
  Filled 2013-12-16 (×8): qty 1

## 2013-12-16 MED ORDER — OMEGA-3-ACID ETHYL ESTERS 1 G PO CAPS
1.0000 g | ORAL_CAPSULE | Freq: Every day | ORAL | Status: DC
  Administered 2013-12-17 – 2013-12-18 (×2): 1 g via ORAL
  Filled 2013-12-16 (×2): qty 1

## 2013-12-16 MED ORDER — LISINOPRIL 5 MG PO TABS
5.0000 mg | ORAL_TABLET | Freq: Every evening | ORAL | Status: DC
  Administered 2013-12-17: 5 mg via ORAL
  Filled 2013-12-16 (×2): qty 1

## 2013-12-16 MED ORDER — ONDANSETRON HCL 4 MG/2ML IJ SOLN: 4.0000 mg | Freq: Four times a day (QID) | INTRAMUSCULAR | Status: DC | PRN

## 2013-12-16 MED ORDER — METOPROLOL TARTRATE 50 MG PO TABS
50.0000 mg | ORAL_TABLET | Freq: Every day | ORAL | Status: DC
  Filled 2013-12-16: qty 1

## 2013-12-16 MED ORDER — ASPIRIN EC 81 MG PO TBEC
81.0000 mg | DELAYED_RELEASE_TABLET | Freq: Every day | ORAL | Status: DC
  Administered 2013-12-17 – 2013-12-18 (×2): 81 mg via ORAL
  Filled 2013-12-16 (×2): qty 1

## 2013-12-16 MED ORDER — CO Q 10 100 MG PO CAPS: 200.0000 mg | ORAL_CAPSULE | Freq: Every day | ORAL | Status: DC

## 2013-12-16 MED ORDER — ACETAMINOPHEN 325 MG PO TABS: 650.0000 mg | ORAL_TABLET | Freq: Four times a day (QID) | ORAL | Status: DC | PRN

## 2013-12-16 MED ORDER — ATORVASTATIN CALCIUM 80 MG PO TABS
80.0000 mg | ORAL_TABLET | Freq: Every day | ORAL | Status: DC
  Administered 2013-12-17: 80 mg via ORAL
  Filled 2013-12-16 (×2): qty 1

## 2013-12-16 NOTE — H&P (Signed)
Kerrie Latour is an 63 y.o. male.     Chief Complaint: chest pain Primary Cardiologist: Dr. Marigene Ehlers HPI: Mr. Everett is a 63 yo man with PMH of CAD s/p CABG '13 (LIMA to LAD, SVG to D2, seq SVG to PLV/PDA, SVG to OM) with brief post-operative atrial fibrillation with no recurrences, hypertension, dyslipidemia, T2DM who presents with intermittent short bouts of chest pain that began yesterday, 12/15/13 that last a few minutes at a time and are not specifically associated with exertion or rest/eating. He characterizes the pain as different than when he had unstable angina/LHC/CABG in 2013.  Initial pain was yesterday and continued intermittently until today. This is unlike any GERD symptoms he's had and really a pain he's never had before. He was last seen by Dr. Marigene Ehlers 12/14. He is compliant with his medications. He has no exercise intolerance and no syncope.      Past Medical History  Diagnosis Date  . Hypertension   . Coronary artery disease   . Anginal pain   . Diabetes mellitus without complication   . WPYKDXIP(382.5)     Past Surgical History  Procedure Laterality Date  . Cardiac catheterization    . Inguinal hernia repair      right   . Coronary artery bypass graft  04/28/2012    Procedure: CORONARY ARTERY BYPASS GRAFTING (CABG);  Surgeon: Ivin Poot, MD;  Location: [place];  Service: Open Heart Surgery;  Laterality: N/A;  Right saphenous vein used for bypass grafting. Five  bypasses performed including left mammary artery.     Family History  Problem Relation Age of Onset  . CAD Mother   . Heart attack Mother   . CAD Father   . Diabetes Father   . Heart attack Father    Social History:  reports that he has never smoked. He does not have any smokeless tobacco history on file. He reports that he drinks alcohol. He reports that he does not use illicit drugs.  Allergies:  Allergies  Allergen Reactions  . Penicillins Hives, Itching and Rash     (Not in a hospital  admission)  Results for orders placed during the hospital encounter of 12/16/13 (from the past 48 hour(s))  I-STAT TROPOININ, ED     Status: None   Collection Time    12/16/13  9:01 PM      Result Value Ref Range   Troponin i, poc 0.00  0.00 - 0.08 ng/mL   Comment 3            Comment: Due to the release kinetics of cTnI,     a negative result within the first hours     of the onset of symptoms does not rule out     myocardial infarction with certainty.     If myocardial infarction is still suspected,     repeat the test at appropriate intervals.  CBC     Status: None   Collection Time    12/16/13  9:02 PM      Result Value Ref Range   WBC 7.6  4.0 - 10.5 K/uL   RBC 4.54  4.22 - 5.81 MIL/uL   Hemoglobin 14.0  13.0 - 17.0 g/dL   HCT 40.6  39.0 - 52.0 %   MCV 89.4  78.0 - 100.0 fL   MCH 30.8  26.0 - 34.0 pg   MCHC 34.5  30.0 - 36.0 g/dL   RDW 13.0  11.5 - 15.5 %  Platelets 166  150 - 400 K/uL  BASIC METABOLIC PANEL     Status: Abnormal   Collection Time    12/16/13  9:02 PM      Result Value Ref Range   Sodium 139  137 - 147 mEq/L   Potassium 4.3  3.7 - 5.3 mEq/L   Chloride 98  96 - 112 mEq/L   CO2 28  19 - 32 mEq/L   Glucose, Bld 151 (*) 70 - 99 mg/dL   BUN 16  6 - 23 mg/dL   Creatinine, Ser 1.15  0.50 - 1.35 mg/dL   Calcium 9.8  8.4 - 10.5 mg/dL   GFR calc non Af Amer 66 (*) >90 mL/min   GFR calc Af Amer 77 (*) >90 mL/min   Comment: (NOTE)     The eGFR has been calculated using the CKD EPI equation.     This calculation has not been validated in all clinical situations.     eGFR's persistently <90 mL/min signify possible Chronic Kidney     Disease.   Anion gap 13  5 - 15   Dg Chest Port 1 View  12/16/2013   CLINICAL DATA:  Sharp pain in the left chest just lateral to the sternum.  EXAM: PORTABLE CHEST - 1 VIEW  COMPARISON:  06/28/2012  FINDINGS: Postoperative changes in the mediastinum. Normal heart size and pulmonary vascularity. Slightly shallow inspiration.  Atelectasis or fibrosis in the left lung base. No focal airspace disease or consolidation. No blunting of costophrenic angles. No pneumothorax. No significant changes since prior study.  IMPRESSION: Linear atelectasis in the left lung base. No evidence of active consolidation.   Electronically Signed   By: Lucienne Capers M.D.   On: 12/16/2013 21:02    Review of Systems  Constitutional: Negative for fever, chills and weight loss.  HENT: Negative for ear pain.   Eyes: Negative for double vision and photophobia.  Respiratory: Negative for cough, hemoptysis and shortness of breath.   Cardiovascular: Positive for chest pain. Negative for palpitations, orthopnea and leg swelling.  Gastrointestinal: Negative for nausea, vomiting and abdominal pain.  Genitourinary: Negative for dysuria, urgency and frequency.  Musculoskeletal: Negative for back pain, myalgias and neck pain.  Skin: Negative for rash.  Neurological: Negative for dizziness, tingling, tremors and headaches.  Endo/Heme/Allergies: Negative for polydipsia. Does not bruise/bleed easily.  Psychiatric/Behavioral: Negative for depression, suicidal ideas, hallucinations and substance abuse.    Blood pressure 112/63, pulse 53, temperature 98.3 F (36.8 C), temperature source Oral, resp. rate 15, SpO2 90.00%. Physical Exam  Nursing note and vitals reviewed. Constitutional: He is oriented to person, place, and time. He appears well-developed and well-nourished. No distress.  HENT:  Head: Normocephalic and atraumatic.  Nose: Nose normal.  Mouth/Throat: Oropharynx is clear and moist. No oropharyngeal exudate.  Eyes: Conjunctivae and EOM are normal. Pupils are equal, round, and reactive to light. No scleral icterus.  Neck: Normal range of motion. No JVD present. No tracheal deviation present.  Cardiovascular: Normal rate, regular rhythm, normal heart sounds and intact distal pulses.  Exam reveals no gallop.   No murmur heard. Respiratory:  Effort normal and breath sounds normal. No respiratory distress. He has no wheezes. He has no rales.  GI: Soft. Bowel sounds are normal. He exhibits no distension. There is no tenderness. There is no rebound.  Musculoskeletal: Normal range of motion. He exhibits no edema and no tenderness.  Neurological: He is alert and oriented to person, place, and time. No cranial  nerve deficit. Coordination normal.  Skin: Skin is warm and dry. No rash noted. He is not diaphoretic. No erythema.  Psychiatric: He has a normal mood and affect. His behavior is normal. Judgment and thought content normal.    Labs reviewed; na 139, K 4.3, bun/cr 16/1.15, Trop 0.00, h/h 14/40.6, plt 166, wbc 7.6 11/13 Echo: EF 55-60% EKG: NSR, HR 65, Poor r-wave progression, LAD, IRBB  Problem List Chest Pain CAD s/p CABG '13 T2DM HTN Dyslipidemia  Assessment/Plan Mr. Forget is a 63 yo man with PMH of CAD s/p CABG '13, HTN, T2DM, dyslipidemia who presents with intermittent chest pain dating to 12/15/13. Differential diagnosis includes atypical chest pain, musculoskeletal pain, ACS/NSTEMI/UA, pericarditis, GERD, esophageal spasm among other etiologies. Given known CAD and new chest pain, very reasonable to trend cardiac markers and observe overnight. We discussed plan for NPO after MN and likely treadmill vs. Functional study in AM if cardiac markers remain negative.  - telemetry, trend cardiac biomarkers - continue home aspirin 81 mg, atorvastatin 80 mg qHS, lisinopril 5 mg, metoprolol 50 mg daily  - likely functional study in AM if cardiac markers remain negative - NPO after MN     Alaila Pillard 12/16/2013, 10:39 PM

## 2013-12-16 NOTE — ED Notes (Signed)
C/o intermittent sharp L sided chest pain x 2 days.  Denies sob, nausea, and vomiting.  Also reports headache 3-4 days ago that resolved.  Denies hx of headaches.

## 2013-12-16 NOTE — ED Notes (Signed)
Attempted report 

## 2013-12-16 NOTE — ED Provider Notes (Signed)
CSN: [id number]     Arrival date & time 12/16/13  2040 History   First MD Initiated Contact with Patient 12/16/13 2118     Chief Complaint  Patient presents with  . Chest Pain     (Consider location/radiation/quality/duration/timing/severity/associated sxs/prior Treatment) HPI 63 year old male presents with intermittent chest pain since yesterday. He states the pain is a sharp pain in his left chest it comes on randomly for a few minutes at a time. He states this happened 2-3 times per day since yesterday. Denies any associated diaphoresis, nausea, or shortness of breath. He has coronary artery disease and had a CABG in 2013. The pain was different when he had the have his cardiac cath. No exertional pain. As he was continuing to have intermittent pain he said to come get checked out. Denies any leg swelling or leg pain. No pleuritic symptoms.  Past Medical History  Diagnosis Date  . Hypertension   . Coronary artery disease   . Anginal pain   . Diabetes mellitus without complication   . MVEHMCNO(709.6)    Past Surgical History  Procedure Laterality Date  . Cardiac catheterization    . Inguinal hernia repair      right   . Coronary artery bypass graft  04/28/2012    Procedure: CORONARY ARTERY BYPASS GRAFTING (CABG);  Surgeon: Ivin Poot, MD;  Location: [place];  Service: Open Heart Surgery;  Laterality: N/A;  Right saphenous vein used for bypass grafting. Five  bypasses performed including left mammary artery.    Family History  Problem Relation Age of Onset  . CAD Mother   . Heart attack Mother   . CAD Father   . Diabetes Father   . Heart attack Father    History  Substance Use Topics  . Smoking status: Never Smoker   . Smokeless tobacco: Not on file  . Alcohol Use: Yes     Comment: maybe 1 beer q 2 weeks if that    Review of Systems  Constitutional: Negative for fever and diaphoresis.  Respiratory: Negative for shortness of breath.   Cardiovascular: Positive for  chest pain. Negative for leg swelling.  Gastrointestinal: Negative for nausea, vomiting and abdominal pain.  All other systems reviewed and are negative.     Allergies  Penicillins  Home Medications   Prior to Admission medications   Medication Sig Start Date End Date Taking? Authorizing Provider  acetaminophen (TYLENOL) 325 MG tablet Take 650 mg by mouth every 6 (six) hours as needed for headache.   Yes Historical Provider, MD  aspirin EC 81 MG tablet Take 81 mg by mouth daily.   Yes Historical Provider, MD  atorvastatin (LIPITOR) 80 MG tablet Take 80 mg by mouth daily at 6 PM.   Yes Historical Provider, MD  Coenzyme Q10 (CO Q 10) 100 MG CAPS Take 200 mg by mouth daily. 10/23/12  Yes Larey Dresser, MD  fish oil-omega-3 fatty acids 1000 MG capsule Take 1 g by mouth daily.   Yes Historical Provider, MD  lisinopril (PRINIVIL,ZESTRIL) 5 MG tablet Take 5 mg by mouth every evening.   Yes Historical Provider, MD  metoprolol (LOPRESSOR) 50 MG tablet Take 1 tablet (50 mg total) by mouth daily. 05/24/13  Yes Larey Dresser, MD   BP 139/79  Pulse 56  Temp(Src) 98.3 F (36.8 C) (Oral)  Resp 16  SpO2 98% Physical Exam  Nursing note and vitals reviewed. Constitutional: He is oriented to person, place, and time. He appears well-developed  and well-nourished. No distress.  HENT:  Head: Normocephalic and atraumatic.  Right Ear: External ear normal.  Left Ear: External ear normal.  Nose: Nose normal.  Eyes: Right eye exhibits no discharge. Left eye exhibits no discharge.  Neck: Neck supple.  Cardiovascular: Normal rate, regular rhythm, normal heart sounds and intact distal pulses.   Pulmonary/Chest: Effort normal.  Well healed CABG scar  Abdominal: Soft. There is no tenderness.  Musculoskeletal: He exhibits no edema.  Neurological: He is alert and oriented to person, place, and time.  Skin: Skin is warm and dry.    ED Course  Procedures (including critical care time) Labs  Review Labs Reviewed  BASIC METABOLIC PANEL - Abnormal; Notable for the following:    Glucose, Bld 151 (*)    GFR calc non Af Amer 66 (*)    GFR calc Af Amer 77 (*)    All other components within normal limits  CBC  TROPONIN I  TROPONIN I  TROPONIN I  CBC  CREATININE, SERUM  I-STAT TROPOININ, ED    Imaging Review Dg Chest Port 1 View  12/16/2013   CLINICAL DATA:  Sharp pain in the left chest just lateral to the sternum.  EXAM: PORTABLE CHEST - 1 VIEW  COMPARISON:  06/28/2012  FINDINGS: Postoperative changes in the mediastinum. Normal heart size and pulmonary vascularity. Slightly shallow inspiration. Atelectasis or fibrosis in the left lung base. No focal airspace disease or consolidation. No blunting of costophrenic angles. No pneumothorax. No significant changes since prior study.  IMPRESSION: Linear atelectasis in the left lung base. No evidence of active consolidation.   Electronically Signed   By: Lucienne Capers M.D.   On: 12/16/2013 21:02     EKG Interpretation   Date/Time:  Sunday December 16 2013 20:45:30 EDT Ventricular Rate:  65 PR Interval:  150 QRS Duration: 112 QT Interval:  446 QTC Calculation: 463 R Axis:   -47 Text Interpretation:  Sinus rhythm with marked sinus arrhythmia Pulmonary  disease pattern Incomplete right bundle branch block Left anterior  fascicular block Left ventricular hypertrophy with repolarization  abnormality Abnormal ECG similar to Nov 13/13 Confirmed by Regenia Skeeter  MD,  [place] ([id number]) on 12/16/2013 9:15:24 PM      MDM   Final diagnoses:  Atypical chest pain    Patient's pain is atypical has been pain-free for several hours. However given his history, I feel he warrants overnight observation. I discussed with cardiology on-call, Dr. Claiborne Billings, who will admit the patient for ACS rule out.    Ephraim Hamburger, MD 12/17/13 [phone]

## 2013-12-17 ENCOUNTER — Observation Stay (HOSPITAL_COMMUNITY): Payer: BC Managed Care – PPO

## 2013-12-17 ENCOUNTER — Encounter (HOSPITAL_COMMUNITY): Payer: BC Managed Care – PPO

## 2013-12-17 DIAGNOSIS — E7800 Pure hypercholesterolemia, unspecified: Secondary | ICD-10-CM

## 2013-12-17 DIAGNOSIS — E119 Type 2 diabetes mellitus without complications: Secondary | ICD-10-CM

## 2013-12-17 DIAGNOSIS — R079 Chest pain, unspecified: Secondary | ICD-10-CM

## 2013-12-17 DIAGNOSIS — R0789 Other chest pain: Secondary | ICD-10-CM

## 2013-12-17 DIAGNOSIS — R9431 Abnormal electrocardiogram [ECG] [EKG]: Secondary | ICD-10-CM

## 2013-12-17 DIAGNOSIS — I209 Angina pectoris, unspecified: Secondary | ICD-10-CM

## 2013-12-17 DIAGNOSIS — I25810 Atherosclerosis of coronary artery bypass graft(s) without angina pectoris: Secondary | ICD-10-CM

## 2013-12-17 LAB — TROPONIN I
Troponin I: <0.3 ng/mL (ref <0.30)
Troponin I: <0.3 ng/mL (ref <0.30)
Troponin I: <0.3 ng/mL (ref <0.30)

## 2013-12-17 LAB — CBC
HCT: 37 % — ABNORMAL LOW (ref 39.0–52.0)
Hemoglobin: 12.5 g/dL — ABNORMAL LOW (ref 13.0–17.0)
MCH: 30 pg (ref 26.0–34.0)
MCHC: 33.8 g/dL (ref 30.0–36.0)
MCV: 88.9 fL (ref 78.0–100.0)
Platelets: 151 10*3/uL (ref 150–400)
RBC: 4.16 MIL/uL — ABNORMAL LOW (ref 4.22–5.81)
RDW: 13 % (ref 11.5–15.5)
WBC: 7.2 10*3/uL (ref 4.0–10.5)

## 2013-12-17 LAB — CREATININE, SERUM
Creatinine, Ser: 1.08 mg/dL (ref 0.50–1.35)
GFR calc Af Amer: 83 mL/min — ABNORMAL LOW (ref >90)
GFR calc non Af Amer: 72 mL/min — ABNORMAL LOW (ref >90)

## 2013-12-17 MED ORDER — METOPROLOL TARTRATE 50 MG PO TABS
50.0000 mg | ORAL_TABLET | Freq: Every day | ORAL | Status: DC
  Administered 2013-12-17 – 2013-12-18 (×2): 50 mg via ORAL
  Filled 2013-12-17 (×2): qty 1

## 2013-12-17 MED ORDER — ALPRAZOLAM 0.25 MG PO TABS: 0.2500 mg | ORAL_TABLET | Freq: Two times a day (BID) | ORAL | Status: DC | PRN

## 2013-12-17 MED ORDER — SODIUM CHLORIDE 0.9 % IJ SOLN: 3.0000 mL | INTRAMUSCULAR | Status: DC | PRN

## 2013-12-17 MED ORDER — NITROGLYCERIN 0.4 MG SL SUBL: 0.4000 mg | SUBLINGUAL_TABLET | SUBLINGUAL | Status: DC | PRN

## 2013-12-17 MED ORDER — SODIUM CHLORIDE 0.9 % IV SOLN: 250.0000 mL | INTRAVENOUS | Status: DC | PRN

## 2013-12-17 MED ORDER — TECHNETIUM TC 99M SESTAMIBI GENERIC - CARDIOLITE
10.0000 | Freq: Once | INTRAVENOUS | Status: AC | PRN
  Administered 2013-12-17: 10 via INTRAVENOUS

## 2013-12-17 MED ORDER — ZOLPIDEM TARTRATE 5 MG PO TABS: 5.0000 mg | ORAL_TABLET | Freq: Every evening | ORAL | Status: DC | PRN

## 2013-12-17 MED ORDER — SODIUM CHLORIDE 0.9 % IV SOLN: INTRAVENOUS | Status: DC

## 2013-12-17 MED ORDER — TECHNETIUM TC 99M SESTAMIBI GENERIC - CARDIOLITE
30.0000 | Freq: Once | INTRAVENOUS | Status: AC | PRN
  Administered 2013-12-17: 30 via INTRAVENOUS

## 2013-12-17 MED ORDER — SODIUM CHLORIDE 0.9 % IJ SOLN
3.0000 mL | Freq: Two times a day (BID) | INTRAMUSCULAR | Status: DC
  Administered 2013-12-18: 3 mL via INTRAVENOUS

## 2013-12-17 NOTE — Progress Notes (Signed)
Utilization Review Completed.[name] [name] T7/11/2013  

## 2013-12-17 NOTE — Progress Notes (Signed)
Myoview read as abnormal. Reviewed with Dr Meda Coffee. Plan is to proceed with cath in am. Risks and benefits discussed with pt and wife and they are agreeable.  Kerin Ransom PA-C 12/17/2013 3:21 PM

## 2013-12-17 NOTE — Progress Notes (Signed)
[name] [name] 12/17/2013

## 2013-12-17 NOTE — Progress Notes (Signed)
    Subjective:  No further chest soreness  Scheduled Meds: . aspirin EC  81 mg Oral Daily  . atorvastatin  80 mg Oral q1800  . heparin  5,000 Units Subcutaneous 3 times per day  . lisinopril  5 mg Oral QPM  . metoprolol  50 mg Oral Daily  . omega-3 acid ethyl esters  1 g Oral Daily   Continuous Infusions:  PRN Meds:.acetaminophen, ondansetron (ZOFRAN) IV   Objective:  Vital Signs in the last 24 hours: Temp:  [97.9 F (36.6 C)-98.3 F (36.8 C)] 97.9 F (36.6 C) (07/06 0548) Pulse Rate:  [53-97] 86 (07/06 0548) Resp:  [11-20] 16 (07/06 0548) BP: (95-139)/(63-79) 119/77 mmHg (07/06 0548) SpO2:  [93 %-99 %] 99 % (07/06 0548) Weight:  [243 lb 6.4 oz (110.406 kg)] 243 lb 6.4 oz (110.406 kg) (07/05 2327)  Intake/Output from previous day:  Intake/Output Summary (Last 24 hours) at 12/17/13 0800 Last data filed at 12/17/13 0548  Gross per 24 hour  Intake      0 ml  Output    900 ml  Net   -900 ml    Physical Exam: General appearance: alert, cooperative and no distress Lungs: clear to auscultation bilaterally Heart: regular rate and rhythm   Rate: 45-70  Rhythm: normal sinus rhythm, sinus bradycardia and premature atrial contractions (PAC)  Lab Results:  Recent Labs  12/16/13 2102 12/17/13 0030  WBC 7.6 7.2  HGB 14.0 12.5*  PLT 166 151    Recent Labs  12/16/13 2102 12/17/13 0030  NA 139  --   K 4.3  --   CL 98  --   CO2 28  --   GLUCOSE 151*  --   BUN 16  --   CREATININE 1.15 1.08    Recent Labs  12/17/13 0030 12/17/13 0505  TROPONINI <0.30 <0.30   No results found for this basename: INR,  in the last 72 hours  Imaging: Imaging results have been reviewed  Cardiac Studies:  Assessment/Plan:  Pt is a 63 y/o with a PMH of CAD s/p CABG Nov 13 (LIMA to LAD, SVG to D2, seq SVG to PLV/PDA, SVG to OM. He presented then after being stung by yellow jackets- he had no chest pain. He has noticed "soreness" Lt chest, not exertional. Troponin negative x 3.      Principal Problem:   Chest pain with moderate risk of acute coronary syndrome Active Problems:   CABG x 18 Apr 2012   DIABETES MELLITUS, TYPE II   HYPERCHOLESTEROLEMIA 219/LDL 149. 06/94   Abnormal EKG- incomplete RBBB    PLAN: I have ordered an exercise Myoview, hold beta blocker this am.   Kerin Ransom PA-C Beeper [phone] 12/17/2013, 8:00 AM   The patient was seen, examined and discussed with Kerin Ransom, PA-C and I agree with the above.   64 year old male with h/o CAD, CABG in 2013 with atypical symptoms at that time. This time atypical angina, seems more musculoskeletal, however we need to rule out ischemia. Troponins negative, ECG shows sinus bradycardia, otherwise unchanged from prior. Bradycardia chronic and asymptomatic.  We will follow after the stress. If negative, can be discharged with outpatient follow up.   Dorothy Spark 12/17/2013

## 2013-12-18 ENCOUNTER — Encounter (HOSPITAL_COMMUNITY)
Admission: EM | Disposition: A | Discharge status: Home / Self Care | Payer: Self-pay | Source: Home / Self Care | Attending: Emergency Medicine

## 2013-12-18 DIAGNOSIS — I2510 Atherosclerotic heart disease of native coronary artery without angina pectoris: Secondary | ICD-10-CM

## 2013-12-18 HISTORY — PX: LEFT HEART CATHETERIZATION WITH CORONARY/GRAFT ANGIOGRAM: SHX5450

## 2013-12-18 HISTORY — PX: CARDIAC CATHETERIZATION: SHX172

## 2013-12-18 LAB — CBC
HCT: 40.9 % (ref 39.0–52.0)
Hemoglobin: 13.9 g/dL (ref 13.0–17.0)
MCH: 30.3 pg (ref 26.0–34.0)
MCHC: 34 g/dL (ref 30.0–36.0)
MCV: 89.3 fL (ref 78.0–100.0)
Platelets: 168 10*3/uL (ref 150–400)
RBC: 4.58 MIL/uL (ref 4.22–5.81)
RDW: 13.1 % (ref 11.5–15.5)
WBC: 8 10*3/uL (ref 4.0–10.5)

## 2013-12-18 LAB — BASIC METABOLIC PANEL
Anion gap: 13 (ref 5–15)
BUN: 11 mg/dL (ref 6–23)
CO2: 26 mEq/L (ref 19–32)
Calcium: 9.5 mg/dL (ref 8.4–10.5)
Chloride: 103 mEq/L (ref 96–112)
Creatinine, Ser: 1.06 mg/dL (ref 0.50–1.35)
GFR calc Af Amer: 85 mL/min — ABNORMAL LOW (ref >90)
GFR calc non Af Amer: 73 mL/min — ABNORMAL LOW (ref >90)
Glucose, Bld: 135 mg/dL — ABNORMAL HIGH (ref 70–99)
Potassium: 4 mEq/L (ref 3.7–5.3)
Sodium: 142 mEq/L (ref 137–147)

## 2013-12-18 LAB — PROTIME-INR
INR: 1.1 (ref 0.00–1.49)
Prothrombin Time: 14.2 seconds (ref 11.6–15.2)

## 2013-12-18 SURGERY — LEFT HEART CATHETERIZATION WITH CORONARY/GRAFT ANGIOGRAM
Anesthesia: LOCAL

## 2013-12-18 MED ORDER — SODIUM CHLORIDE 0.9 % IV SOLN: INTRAVENOUS | Status: DC

## 2013-12-18 MED ORDER — NITROGLYCERIN 0.2 MG/ML ON CALL CATH LAB
INTRAVENOUS | Status: AC
  Filled 2013-12-18: qty 1

## 2013-12-18 MED ORDER — ISOSORBIDE MONONITRATE ER 30 MG PO TB24: 30.0000 mg | ORAL_TABLET | Freq: Every day | ORAL | Status: DC

## 2013-12-18 MED ORDER — MIDAZOLAM HCL 2 MG/2ML IJ SOLN
INTRAMUSCULAR | Status: AC
  Filled 2013-12-18: qty 2

## 2013-12-18 MED ORDER — LIDOCAINE HCL (PF) 1 % IJ SOLN
INTRAMUSCULAR | Status: AC
  Filled 2013-12-18: qty 30

## 2013-12-18 MED ORDER — HEPARIN SODIUM (PORCINE) 1000 UNIT/ML IJ SOLN
INTRAMUSCULAR | Status: AC
  Filled 2013-12-18: qty 1

## 2013-12-18 MED ORDER — FENTANYL CITRATE 0.05 MG/ML IJ SOLN
INTRAMUSCULAR | Status: AC
  Filled 2013-12-18: qty 2

## 2013-12-18 MED ORDER — OXYCODONE-ACETAMINOPHEN 5-325 MG PO TABS: 1.0000 | ORAL_TABLET | ORAL | Status: DC | PRN

## 2013-12-18 MED ORDER — VERAPAMIL HCL 2.5 MG/ML IV SOLN
INTRAVENOUS | Status: AC
  Filled 2013-12-18: qty 2

## 2013-12-18 MED ORDER — CLOPIDOGREL BISULFATE 75 MG PO TABS: 75.0000 mg | ORAL_TABLET | Freq: Every day | ORAL | Status: DC

## 2013-12-18 MED ORDER — ISOSORBIDE MONONITRATE ER 30 MG PO TB24
30.0000 mg | ORAL_TABLET | Freq: Every day | ORAL | Status: DC
  Administered 2013-12-18: 30 mg via ORAL
  Filled 2013-12-18 (×2): qty 1

## 2013-12-18 MED ORDER — HEPARIN (PORCINE) IN NACL 2-0.9 UNIT/ML-% IJ SOLN
INTRAMUSCULAR | Status: AC
  Filled 2013-12-18: qty 1500

## 2013-12-18 NOTE — Discharge Instructions (Signed)
Radial Site Care Refer to this sheet in the next few weeks. These instructions provide you with information on caring for yourself after your procedure. Your caregiver may also give you more specific instructions. Your treatment has been planned according to current medical practices, but problems sometimes occur. Call your caregiver if you have any problems or questions after your procedure. HOME CARE INSTRUCTIONS  You may shower the day after the procedure.Remove the bandage (dressing) and gently wash the site with plain soap and water.Gently pat the site dry.  Do not apply powder or lotion to the site.  Do not submerge the affected site in water for 3 to 5 days.  Inspect the site at least twice daily.  Do not flex or bend the affected arm for 24 hours.  No lifting over 5 pounds (2.3 kg) for 5 days after your procedure.  Do not drive home if you are discharged the same day of the procedure. Have someone else drive you.  You may drive 24 hours after the procedure unless otherwise instructed by your caregiver.  Do not operate machinery or power tools for 24 hours.  A responsible adult should be with you for the first 24 hours after you arrive home. What to expect:  Any bruising will usually fade within 1 to 2 weeks.  Blood that collects in the tissue (hematoma) may be painful to the touch. It should usually decrease in size and tenderness within 1 to 2 weeks. SEEK IMMEDIATE MEDICAL CARE IF:  You have unusual pain at the radial site.  You have redness, warmth, swelling, or pain at the radial site.  You have drainage (other than a small amount of blood on the dressing).  You have chills.  You have a fever or persistent symptoms for more than 72 hours.  You have a fever and your symptoms suddenly get worse.  Your arm becomes pale, cool, tingly, or numb.  You have heavy bleeding from the site. Hold pressure on the site. Document Released: 07/03/2010 Document Revised:  08/23/2011 Document Reviewed: 07/03/2010 [facility] Patient Information 2015 [place], [place]. This information is not intended to replace advice given to you by your health care provider. Make sure you discuss any questions you have with your health care provider.  Diets for Diabetes, Food Labeling Look at food labels to help you decide how much of a product you can eat. You will want to check the amount of total carbohydrate in a serving to see how the food fits into your meal plan. In the list of ingredients, the ingredient present in the largest amount by weight must be listed first, followed by the other ingredients in descending order. STANDARD OF IDENTITY Most products have a list of ingredients. However, foods that the Food and Drug Administration (FDA) has given a standard of identity do not need a list of ingredients. A standard of identity means that a food must contain certain ingredients if it is called a particular name. Examples are mayonnaise, peanut butter, ketchup, jelly, and cheese. LABELING TERMS There are many terms found on food labels. Some of these terms have specific definitions. Some terms are regulated by the FDA, and the FDA has clearly specified how they can be used. Others are not regulated or well-defined and can be misleading and confusing. SPECIFICALLY DEFINED TERMS Nutritive Sweetener.  A sweetener that contains calories,such as table sugar or honey. Nonnutritive Sweetener.  A sweetener with few or no calories,such as saccharin, aspartame, sucralose, and cyclamate. LABELING TERMS REGULATED  BY THE FDA Free.  The product contains only a tiny or small amount of fat, cholesterol, sodium, sugar, or calories. For example, a "fat-free" product will contain less than 0.5 g of fat per serving. Low.  A food described as "low" in fat, saturated fat, cholesterol, sodium, or calories could be eaten fairly often without exceeding dietary guidelines. For example, "low in fat"  means no more than 3 g of fat per serving. Lean.  "Lean" and "extra lean" are U.S. Department of Agriculture Scientist, research (physical sciences)) terms for use on meat and poultry products. "Lean" means the product contains less than 10 g of fat, 4 g of saturated fat, and 95 mg of cholesterol per serving. "Lean" is not as low in fat as a product labeled "low." Extra Lean.  "Extra lean" means the product contains less than 5 g of fat, 2 g of saturated fat, and 95 mg of cholesterol per serving. While "extra lean" has less fat than "lean," it is still higher in fat than a product labeled "low." Reduced, Less, Fewer.  A diet product that contains 25% less of a nutrient or calories than the regular version. For example, hot dogs might be labeled "25% less fat than our regular hot dogs." Light/Lite.  A diet product that contains  fewer calories or  the fat of the original. For example, "light in sodium" means a product with  the usual sodium. More.  One serving contains at least 10% more of the daily value of a vitamin, mineral, or fiber than usual. Good Source Of.  One serving contains 10% to 19% of the daily value for a particular vitamin, mineral, or fiber. Excellent Source Of.  One serving contains 20% or more of the daily value for a particular nutrient. Other terms used might be "high in" or "rich in." Enriched or Fortified.  The product contains added vitamins, minerals, or protein. Nutrition labeling must be used on enriched or fortified foods. Imitation.  The product has been altered so that it is lower in protein, vitamins, or minerals than the usual food,such as imitation peanut butter. Total Fat.  The number listed is the total of all fat found in a serving of the product. Under total fat, food labels must list saturated fat and trans fat, which are associated with raising bad cholesterol and an increased risk of heart blood vessel disease. Saturated Fat.  Mainly fats from animal-based sources. Some  examples are red meat, cheese, cream, whole milk, and coconut oil. Trans Fat.  Found in some fried snack foods, packaged foods, and fried restaurant foods. It is recommended you eat as close to 0 g of trans fat as possible, since it raises bad cholesterol and lowers good cholesterol. Polyunsaturated and Monounsaturated Fats.  More healthful fats. These fats are from plant sources. Total Carbohydrate.  The number of carbohydrate grams in a serving of the product. Under total carbohydrate are listed the other carbohydrate sources, such as dietary fiber and sugars. Dietary Fiber.  A carbohydrate from plant sources. Sugars.  Sugars listed on the label contain all naturally occurring sugars as well as added sugars. LABELING TERMS NOT REGULATED BY THE FDA Sugarless.  Table sugar (sucrose) has not been added. However, the manufacturer may use another form of sugar in place of sucrose to sweeten the product. For example, sugar alcohols are used to sweeten foods. Sugar alcohols are a form of sugar but are not table sugar. If a product contains sugar alcohols in place of sucrose, it can  still be labeled "sugarless." Low Salt, Salt-Free, Unsalted, No Salt, No Salt Added, Without Added Salt.  Food that is usually processed with salt has been made without salt. However, the food may contain sodium-containing additives, such as preservatives, leavening agents, or flavorings. Natural.  This term has no legal meaning. Organic.  Foods that are certified as organic have been inspected and approved by the USDA to ensure they are produced without pesticides, fertilizers containing synthetic ingredients, bioengineering, or ionizing radiation. Document Released: 06/03/2003 Document Revised: 08/23/2011 Document Reviewed: 12/19/2008 [facility] Patient Information 2015 [place], [place]. This information is not intended to replace advice given to you by your health care provider. Make sure you discuss any questions  you have with your health care provider.   Diabetes Mellitus and Food It is important for you to manage your blood sugar (glucose) level. Your blood glucose level can be greatly affected by what you eat. Eating healthier foods in the appropriate amounts throughout the day at about the same time each day will help you control your blood glucose level. It can also help slow or prevent worsening of your diabetes mellitus. Healthy eating may even help you improve the level of your blood pressure and reach or maintain a healthy weight.  HOW CAN FOOD AFFECT ME? Carbohydrates Carbohydrates affect your blood glucose level more than any other type of food. Your dietitian will help you determine how many carbohydrates to eat at each meal and teach you how to count carbohydrates. Counting carbohydrates is important to keep your blood glucose at a healthy level, especially if you are using insulin or taking certain medicines for diabetes mellitus. Alcohol Alcohol can cause sudden decreases in blood glucose (hypoglycemia), especially if you use insulin or take certain medicines for diabetes mellitus. Hypoglycemia can be a life-threatening condition. Symptoms of hypoglycemia (sleepiness, dizziness, and disorientation) are similar to symptoms of having too much alcohol.  If your health care provider has given you approval to drink alcohol, do so in moderation and use the following guidelines:  Women should not have more than one drink per day, and men should not have more than two drinks per day. One drink is equal to:  12 oz of beer.  5 oz of wine.  1 oz of hard liquor.  Do not drink on an empty stomach.  Keep yourself hydrated. Have water, diet soda, or unsweetened iced tea.  Regular soda, juice, and other mixers might contain a lot of carbohydrates and should be counted. WHAT FOODS ARE NOT RECOMMENDED? As you make food choices, it is important to remember that all foods are not the same. Some foods  have fewer nutrients per serving than other foods, even though they might have the same number of calories or carbohydrates. It is difficult to get your body what it needs when you eat foods with fewer nutrients. Examples of foods that you should avoid that are high in calories and carbohydrates but low in nutrients include:  Trans fats (most processed foods list trans fats on the Nutrition Facts label).  Regular soda.  Juice.  Candy.  Sweets, such as cake, pie, doughnuts, and cookies.  Fried foods. WHAT FOODS CAN I EAT? Have nutrient-rich foods, which will nourish your body and keep you healthy. The food you should eat also will depend on several factors, including:  The calories you need.  The medicines you take.  Your weight.  Your blood glucose level.  Your blood pressure level.  Your cholesterol level. You also should  eat a variety of foods, including:  Protein, such as meat, poultry, fish, tofu, nuts, and seeds (lean animal proteins are best).  Fruits.  Vegetables.  Dairy products, such as milk, cheese, and yogurt (low fat is best).  Breads, grains, pasta, cereal, rice, and beans.  Fats such as olive oil, trans fat-free margarine, canola oil, avocado, and olives. DOES EVERYONE WITH DIABETES MELLITUS HAVE THE SAME MEAL PLAN? Because every person with diabetes mellitus is different, there is not one meal plan that works for everyone. It is very important that you meet with a dietitian who will help you create a meal plan that is just right for you. Document Released: 02/25/2005 Document Revised: 06/05/2013 Document Reviewed: 04/27/2013 [facility] Patient Information 2015 [place], [place]. This information is not intended to replace advice given to you by your health care provider. Make sure you discuss any questions you have with your health care provider.

## 2013-12-18 NOTE — Interval H&P Note (Signed)
History and Physical Interval Note:  12/18/2013 9:09 AM Presenting symptoms sound non anginal. Nuclear is abnormal.Cath Lab Visit (complete for each Cath Lab visit)  Clinical Evaluation Leading to the Procedure:   ACS: Yes.    Non-ACS:    Anginal Classification: CCS IV  Anti-ischemic medical therapy: Maximal Therapy (2 or more classes of medications)  Non-Invasive Test Results: Intermediate-risk stress test findings: cardiac mortality 1-3%/year  Prior CABG: Previous CABG       [name] [name]  has presented today for surgery, with the diagnosis of cp  The various methods of treatment have been discussed with the patient and family. After consideration of risks, benefits and other options for treatment, the patient has consented to  Procedure(s): LEFT HEART CATHETERIZATION WITH CORONARY/GRAFT ANGIOGRAM (N/A) as a surgical intervention .  The patient's history has been reviewed, patient examined, no change in status, stable for surgery.  I have reviewed the patient's chart and labs.  Questions were answered to the patient's satisfaction.     Sinclair Grooms

## 2013-12-18 NOTE — Discharge Summary (Signed)
Patient ID: [name] [name],  MRN: [id number], DOB/AGE: Mar 10, 1951 63 y.o.  Admit date: 12/16/2013 Discharge date: 12/18/2013  Primary Care Provider:  Primary Cardiologist: Dr Aundra Dubin  Discharge Diagnoses Principal Problem:   Chest pain with moderate risk of acute coronary syndrome Active Problems:   CABG x 18 Apr 2012   DIABETES MELLITUS, TYPE II   HYPERCHOLESTEROLEMIA 219/LDL 149. 06/94   Abnormal EKG- incomplete RBBB    Procedures: GXT Myoview 12/17/13                        Coronary angiogram 12/18/13   Hospital Course: The pt is a 63 y/o with a PMH of CAD s/p CABG Nov 13 (LIMA to LAD, SVG to D2, seq SVG to PLV/PDA, SVG to OM). He presented initially in 2013 after being stung by yellow jackets- he had no chest pain. He was found to have a STEMI when he presented to the ER then.               He has noticed "soreness" Lt chest, not exertional. He was concerned and came to the ER 12/16/13 for further evaluation. Troponin were negative x 3. He had an exercise Myoview 12/17/13. His stress was adequate, he walked 10 minutes of the Bruce protocol without chest pain. He did have PVCs and 43m ST depression in V2 and AVL. Images suggested AL ischemia and he underwent elective cath 12/18/13. This revealed occlusion of his vein grafts and a patent LIMA-LAD. His LVF was normal. The plan is for medical Rx. Plavix and Imdur have been added. Dr STamala Juliannotes his cath was technically difficult through the Lt radial artery and he suggests this approach not be used in the future. He will be discharged later on the 7th if stable.     Discharge Vitals:  Blood pressure 120/68, pulse 51, temperature 98.3 F (36.8 C), temperature source Oral, resp. rate 18, height 6' 4"  (1.93 m), weight 235 lb 12.8 oz (106.958 kg), SpO2 96.00%.    Labs: Results for orders placed during the hospital encounter of 12/16/13 (from the past 48 hour(s))  I-STAT TROPOININ, ED     Status: None   Collection Time    12/16/13  9:01 PM        Result Value Ref Range   Troponin i, poc 0.00  0.00 - 0.08 ng/mL   Comment 3            Comment: Due to the release kinetics of cTnI,     a negative result within the first hours     of the onset of symptoms does not rule out     myocardial infarction with certainty.     If myocardial infarction is still suspected,     repeat the test at appropriate intervals.  CBC     Status: None   Collection Time    12/16/13  9:02 PM      Result Value Ref Range   WBC 7.6  4.0 - 10.5 K/uL   RBC 4.54  4.22 - 5.81 MIL/uL   Hemoglobin 14.0  13.0 - 17.0 g/dL   HCT 40.6  39.0 - 52.0 %   MCV 89.4  78.0 - 100.0 fL   MCH 30.8  26.0 - 34.0 pg   MCHC 34.5  30.0 - 36.0 g/dL   RDW 13.0  11.5 - 15.5 %   Platelets 166  150 - 400 K/uL  BASIC METABOLIC PANEL  Status: Abnormal   Collection Time    12/16/13  9:02 PM      Result Value Ref Range   Sodium 139  137 - 147 mEq/L   Potassium 4.3  3.7 - 5.3 mEq/L   Chloride 98  96 - 112 mEq/L   CO2 28  19 - 32 mEq/L   Glucose, Bld 151 (*) 70 - 99 mg/dL   BUN 16  6 - 23 mg/dL   Creatinine, Ser 1.15  0.50 - 1.35 mg/dL   Calcium 9.8  8.4 - 10.5 mg/dL   GFR calc non Af Amer 66 (*) >90 mL/min   GFR calc Af Amer 77 (*) >90 mL/min   Comment: (NOTE)     The eGFR has been calculated using the CKD EPI equation.     This calculation has not been validated in all clinical situations.     eGFR's persistently <90 mL/min signify possible Chronic Kidney     Disease.   Anion gap 13  5 - 15  TROPONIN I     Status: None   Collection Time    12/17/13 12:30 AM      Result Value Ref Range   Troponin I <0.30  <0.30 ng/mL   Comment:            Due to the release kinetics of cTnI,     a negative result within the first hours     of the onset of symptoms does not rule out     myocardial infarction with certainty.     If myocardial infarction is still suspected,     repeat the test at appropriate intervals.  CBC     Status: Abnormal   Collection Time    12/17/13 12:30  AM      Result Value Ref Range   WBC 7.2  4.0 - 10.5 K/uL   RBC 4.16 (*) 4.22 - 5.81 MIL/uL   Hemoglobin 12.5 (*) 13.0 - 17.0 g/dL   HCT 37.0 (*) 39.0 - 52.0 %   MCV 88.9  78.0 - 100.0 fL   MCH 30.0  26.0 - 34.0 pg   MCHC 33.8  30.0 - 36.0 g/dL   RDW 13.0  11.5 - 15.5 %   Platelets 151  150 - 400 K/uL  CREATININE, SERUM     Status: Abnormal   Collection Time    12/17/13 12:30 AM      Result Value Ref Range   Creatinine, Ser 1.08  0.50 - 1.35 mg/dL   GFR calc non Af Amer 72 (*) >90 mL/min   GFR calc Af Amer 83 (*) >90 mL/min   Comment: (NOTE)     The eGFR has been calculated using the CKD EPI equation.     This calculation has not been validated in all clinical situations.     eGFR's persistently <90 mL/min signify possible Chronic Kidney     Disease.  TROPONIN I     Status: None   Collection Time    12/17/13  5:05 AM      Result Value Ref Range   Troponin I <0.30  <0.30 ng/mL   Comment:            Due to the release kinetics of cTnI,     a negative result within the first hours     of the onset of symptoms does not rule out     myocardial infarction with certainty.     If myocardial infarction is still  suspected,     repeat the test at appropriate intervals.  TROPONIN I     Status: None   Collection Time    12/17/13  1:00 PM      Result Value Ref Range   Troponin I <0.30  <0.30 ng/mL   Comment:            Due to the release kinetics of cTnI,     a negative result within the first hours     of the onset of symptoms does not rule out     myocardial infarction with certainty.     If myocardial infarction is still suspected,     repeat the test at appropriate intervals.  BASIC METABOLIC PANEL     Status: Abnormal   Collection Time    12/18/13  7:30 AM      Result Value Ref Range   Sodium 142  137 - 147 mEq/L   Potassium 4.0  3.7 - 5.3 mEq/L   Chloride 103  96 - 112 mEq/L   CO2 26  19 - 32 mEq/L   Glucose, Bld 135 (*) 70 - 99 mg/dL   BUN 11  6 - 23 mg/dL    Creatinine, Ser 1.06  0.50 - 1.35 mg/dL   Calcium 9.5  8.4 - 10.5 mg/dL   GFR calc non Af Amer 73 (*) >90 mL/min   GFR calc Af Amer 85 (*) >90 mL/min   Comment: (NOTE)     The eGFR has been calculated using the CKD EPI equation.     This calculation has not been validated in all clinical situations.     eGFR's persistently <90 mL/min signify possible Chronic Kidney     Disease.   Anion gap 13  5 - 15  PROTIME-INR     Status: None   Collection Time    12/18/13  7:30 AM      Result Value Ref Range   Prothrombin Time 14.2  11.6 - 15.2 seconds   INR 1.10  0.00 - 1.49  CBC     Status: None   Collection Time    12/18/13  7:30 AM      Result Value Ref Range   WBC 8.0  4.0 - 10.5 K/uL   RBC 4.58  4.22 - 5.81 MIL/uL   Hemoglobin 13.9  13.0 - 17.0 g/dL   HCT 40.9  39.0 - 52.0 %   MCV 89.3  78.0 - 100.0 fL   MCH 30.3  26.0 - 34.0 pg   MCHC 34.0  30.0 - 36.0 g/dL   RDW 13.1  11.5 - 15.5 %   Platelets 168  150 - 400 K/uL    Disposition:  Follow-up Information   Follow up with Erlene Quan, PA-C On 01/08/2014. (9:30 am)    Specialty:  Cardiology   Contact information:   [address] [place] [address] [phone]       Discharge Medications:    Medication List         acetaminophen 325 MG tablet  Commonly known as:  TYLENOL  Take 650 mg by mouth every 6 (six) hours as needed for headache.     aspirin EC 81 MG tablet  Take 81 mg by mouth daily.     atorvastatin 80 MG tablet  Commonly known as:  LIPITOR  Take 80 mg by mouth daily at 6 PM.     clopidogrel 75 MG tablet  Commonly known as:  PLAVIX  Take 1 tablet (75 mg total) by mouth daily with breakfast.  Start taking on:  12/19/2013     Co Q 10 100 MG Caps  Take 200 mg by mouth daily.     fish oil-omega-3 fatty acids 1000 MG capsule  Take 1 g by mouth daily.     isosorbide mononitrate 30 MG 24 hr tablet  Commonly known as:  IMDUR  Take 1 tablet (30 mg total) by mouth daily.     lisinopril 5 MG  tablet  Commonly known as:  PRINIVIL,ZESTRIL  Take 5 mg by mouth every evening.     metoprolol 50 MG tablet  Commonly known as:  LOPRESSOR  Take 1 tablet (50 mg total) by mouth daily.     nitroGLYCERIN 0.4 MG SL tablet  Commonly known as:  NITROSTAT  Place 1 tablet (0.4 mg total) under the tongue every 5 (five) minutes as needed for chest pain.         Duration of Discharge Encounter: Greater than 30 minutes including physician time.  Angelena Form PA-C 12/18/2013 12:37 PM

## 2013-12-18 NOTE — CV Procedure (Signed)
Left Heart Catheterization with Coronary Angiography and Bypass Angiography Report  [name] [name]  63 y.o.  male 06-04-1951  Procedure Date: 12/18/2013 Referring Physician: Lilian Kapur, M.D. Primary Cardiologist: Loralie Champagne, M.D.  INDICATIONS: Atypical chest pain with lateral wall ischemia by myocardial perfusion imaging. No evidence of myocardial infarction.  PROCEDURE: 1. Left heart catheterization; 2. Coronary angiography; 3. Left ventriculography; 4. Saphenous vein graft angiography; 5. Left internal mammary angiography  CONSENT:  The risks, benefits, and details of the procedure were explained in detail to the patient. Risks including death, stroke, heart attack, kidney injury, allergy, limb ischemia, bleeding and radiation injury were discussed.  The patient verbalized understanding and wanted to proceed.  Informed written consent was obtained.  PROCEDURE TECHNIQUE:  After Xylocaine anesthesia a 5 French Slender sheath was placed in the leftt radial artery with an angiocath and the modified Seldinger technique.  Coronary angiography was done using multiple diagnostic catheters which included a 5 F JL 3.5, JL 4, A2 multipurpose x2, No torque right, right bypass graft catheter, AL-1, and JR 4 diagnostic catheter.  Left ventriculography was done using the JR 4 catheter and hand injection.   The case was very difficult do to inability to control catheter torque produced by tortuosity of the subclavian and aortic bifurcation. Imaging of the saphenous vein graft was particularly difficult. The saphenous vein graft to the right coronary could not be primarily cannulated. This graft was obviously occluded as retrograde filling of the distal anastomosis demonstrated a distal complete occlusion.  The images were reviewed and I did not feel, but with proceeding with any further PCI due to constraints with catheter movement and also the complexity of the patient's left coronary  anatomy which would require multiple stents including at least 2 bifurcation stents.  It is determined that the patient's symptoms or intractable, repeat catheterization via the femoral would be the most favorable approach although it is conceivable that the right radial approach would not be as complicated as the left was during this procedure. Also for the catheterization is performed, cannulation of the SVG to the RCA to document ostial occlusion would be necessary.   CONTRAST:  Total of 160 cc.  COMPLICATIONS:  Difficult catheter torque control and movement due to subclavian aortic angulation .  HEMODYNAMICS:  Aortic pressure 112/63 mmHg; LV pressure 112/6 mmHg; LVEDP 12 mm mercury  ANGIOGRAPHIC DATA:   The left main coronary artery is patent..  The left anterior descending artery is ostial/proximal 90% stenosis to the LAD contains diffuse mid disease with 50-70% stenosis the first diagonal contains 70% proximal stenosis competition is noted with the patent LIMA in the mid vessel..  The left circumflex artery is highly disease. The vessel is small in caliber due to the diffuse nature of disease. There is ostial 80% narrowing and proximal 90% narrowing before the first obtuse marginal origin. The first marginal contains a complex bifurcation lesion with 2 moderate to large branches. The first branch contains mid 90% stenosis with the graft was inserted. A second  branch contains diffuse 80% narrowing after the bifurcation.  The right coronary artery is a dominant vessel. It gives origin to a large PDA. Luminal irregularities are noted proximally. It percent distal narrowing is noted. The large PDA is widely patent. A side-to-side anastomosis with the sequential saphenous vein graft is widely patent and supplies the second left ventricular branch which is large. There is other moderate disease in the distal right coronary at the origin of  the third left ventricular branch. No proximal high-grade  disease is noted in the RCA.Marland Kitchen  BYPASS GRAFT ANGIOGRAPHY: Saphenous vein graft sequential to the right coronary: Totally occluded distally Saphenous vein graft diagonal is totally occluded. Saphenous vein graft to the obtuse marginal Is totally occluded LIMA to the LAD is widely patent  LEFT VENTRICULOGRAM:  Left ventricular angiogram was done in the 30 RAO projection and revealed normal cavity size with EF of 60%   IMPRESSIONS:  1. Occlusion of saphenous vein grafts as noted above  2. Patent LIMA to the mid LAD  3. Severe native coronary disease with ostial LAD obstruction, ostial circumflex disease followed by severe diffuse disease in major branches, and significant disease in the second and third left ventricular branches of the RCA. No significant proximal disease in the right coronary is noted.  4. Normal left ventricular systolic function   RECOMMENDATION:  Medical management for lateral wall ischemia seems to be the most prudent treatment strategy at this time. LAD territory is supplied by a patent LIMA . If the patient has refractory symptoms, one could contemplate circumflex PCI but would be very complicated and require multiple stents starting at the ostium and include a bifurcation stent of the first obtuse marginal and  further distal in the mid marginal branches which become very small in caliber. This is certainly not an ideal situation and conservative management should be tried before heroic PCI.  Included in the patient's treatment regimen long-acting nitrates, beta blocker therapy, aspirin, and Plavix. Eligible for discharge soon, with followup with primary physician in one to 3 weeks to report symptoms and make further decisions concerning invasive management if needed.  Catheterization from the left radial was very difficult it should be avoided in the future if possible. The major issue was an acute angle with noncompliant subclavian and aortic vessels in May catheter  torque control very difficult. The right radial and preferably the right femoral would be the preferred future approaches.

## 2014-01-08 ENCOUNTER — Encounter: Payer: Self-pay | Admitting: Cardiology

## 2014-01-08 ENCOUNTER — Ambulatory Visit (INDEPENDENT_AMBULATORY_CARE_PROVIDER_SITE_OTHER): Payer: BC Managed Care – PPO | Admitting: Cardiology

## 2014-01-08 VITALS — BP 110/74 | HR 62 | Ht 76.0 in | Wt 241.6 lb

## 2014-01-08 DIAGNOSIS — E785 Hyperlipidemia, unspecified: Secondary | ICD-10-CM

## 2014-01-08 DIAGNOSIS — I10 Essential (primary) hypertension: Secondary | ICD-10-CM

## 2014-01-08 NOTE — Patient Instructions (Signed)
Your physician recommends that you schedule a follow-up appointment in: Three months with Dr.Mclean

## 2014-01-08 NOTE — Assessment & Plan Note (Signed)
Controlled.  

## 2014-01-08 NOTE — Assessment & Plan Note (Signed)
LDL 56 Dec 2014

## 2014-01-08 NOTE — Assessment & Plan Note (Signed)
Doing well since discharge on medical Rx

## 2014-01-08 NOTE — Progress Notes (Signed)
01/08/2014 [name] [name]   12-29-1950  [id number]  Primary Hackett, NP Primary Cardiologist: Dr Aundra Dubin  HPI:  The pt is a 63 y/o with a PMH of CAD s/p CABG Nov 13 (LIMA to LAD, SVG to D2, seq SVG to PLV/PDA, SVG to OM). He presented initially in 2013 after being stung by yellow jackets- he had no chest pain. He was found to have a STEMI when he presented to the ER then.  He was admitted through the  ER 12/16/13 with chest pain. Troponin were negative x 3. He had an exercise Myoview 12/17/13. His stress was adequate, he walked 10 minutes of the Bruce protocol without chest pain. He did have PVCs and 93mm ST depression in V2 and AVL. Images suggested AL ischemia and he underwent elective cath 12/18/13. This revealed occlusion of his vein grafts and a patent LIMA-LAD. His LVF was normal. The plan is for medical Rx. Plavix and Imdur were added. Dr Tamala Julian notes his cath was technically difficult through the Lt radial artery and he suggests this approach not be used in the future. He is in the office today for follow up. He has been doing well from a cardiac standooint. He is tolerating his medications. He has been under some stress, his mother passed away yesterday.     Current Outpatient Prescriptions  Medication Sig Dispense Refill  . acetaminophen (TYLENOL) 325 MG tablet Take 650 mg by mouth every 6 (six) hours as needed for headache.      Marland Kitchen aspirin EC 81 MG tablet Take 81 mg by mouth daily.      Marland Kitchen atorvastatin (LIPITOR) 80 MG tablet Take 80 mg by mouth daily at 6 PM.      . clopidogrel (PLAVIX) 75 MG tablet Take 1 tablet (75 mg total) by mouth daily with breakfast.  30 tablet  11  . Coenzyme Q10 (CO Q 10) 100 MG CAPS Take 200 mg by mouth daily.  30 capsule  6  . fish oil-omega-3 fatty acids 1000 MG capsule Take 1 g by mouth daily.      . isosorbide mononitrate (IMDUR) 30 MG 24 hr tablet Take 1 tablet (30 mg total) by mouth daily.  30 tablet  11  . lisinopril (PRINIVIL,ZESTRIL) 5 MG  tablet Take 5 mg by mouth every evening.      . metoprolol (LOPRESSOR) 50 MG tablet Take 1 tablet (50 mg total) by mouth daily.  90 tablet  3  . nitroGLYCERIN (NITROSTAT) 0.4 MG SL tablet Place 1 tablet (0.4 mg total) under the tongue every 5 (five) minutes as needed for chest pain.  25 tablet  2   No current facility-administered medications for this visit.    Allergies  Allergen Reactions  . Penicillins Hives, Itching and Rash    History   Social History  . Marital Status: Married    Spouse Name: N/A    Number of Children: N/A  . Years of Education: N/A   Occupational History  . Not on file.   Social History Main Topics  . Smoking status: Never Smoker   . Smokeless tobacco: Not on file  . Alcohol Use: Yes     Comment: maybe 1 beer q 2 weeks if that  . Drug Use: No  . Sexual Activity: Yes   Other Topics Concern  . Not on file   Social History Narrative  . No narrative on file     Review of Systems: General: negative for chills,  fever, night sweats or weight changes.  Cardiovascular: negative for chest pain, dyspnea on exertion, edema, orthopnea, palpitations, paroxysmal nocturnal dyspnea or shortness of breath Dermatological: negative for rash Respiratory: negative for cough or wheezing Urologic: negative for hematuria Abdominal: negative for nausea, vomiting, diarrhea, bright red blood per rectum, melena, or hematemesis Neurologic: negative for visual changes, syncope, or dizziness All other systems reviewed and are otherwise negative except as noted above.    Blood pressure 110/74, pulse 62, height 6\' 4"  (1.93 m), weight 241 lb 9.6 oz (109.589 kg).  General appearance: alert, cooperative and no distress Neck: no adenopathy, no carotid bruit, no JVD, supple, symmetrical, trachea midline and thyroid not enlarged, symmetric, no tenderness/mass/nodules Lungs: clear to auscultation bilaterally Heart: regular rate and rhythm Extremities: varicosities  noted   ASSESSMENT AND PLAN:   CABG x 18 Apr 2012- occluded SVGs 12/18/13 Doing well since discharge on medical Rx  Dyslipidemia LDL 56 Dec 2014  HYPERTENSION Controlled   PLAN  Same medical Rx, follow up with Dr Aundra Dubin in 3 months.   [name] KPA-C 01/08/2014 10:30 AM

## 2014-01-16 IMAGING — CR DG CERVICAL SPINE COMPLETE 4+V
7 series · 7 of 7 positions shown · non-contrast
Comparison: 09/19/2006

CLINICAL DATA: Pain in posterior neck

CERVICAL SPINE - COMPLETE 4+ VIEW

[w cervical spine lat (1 of 2)]
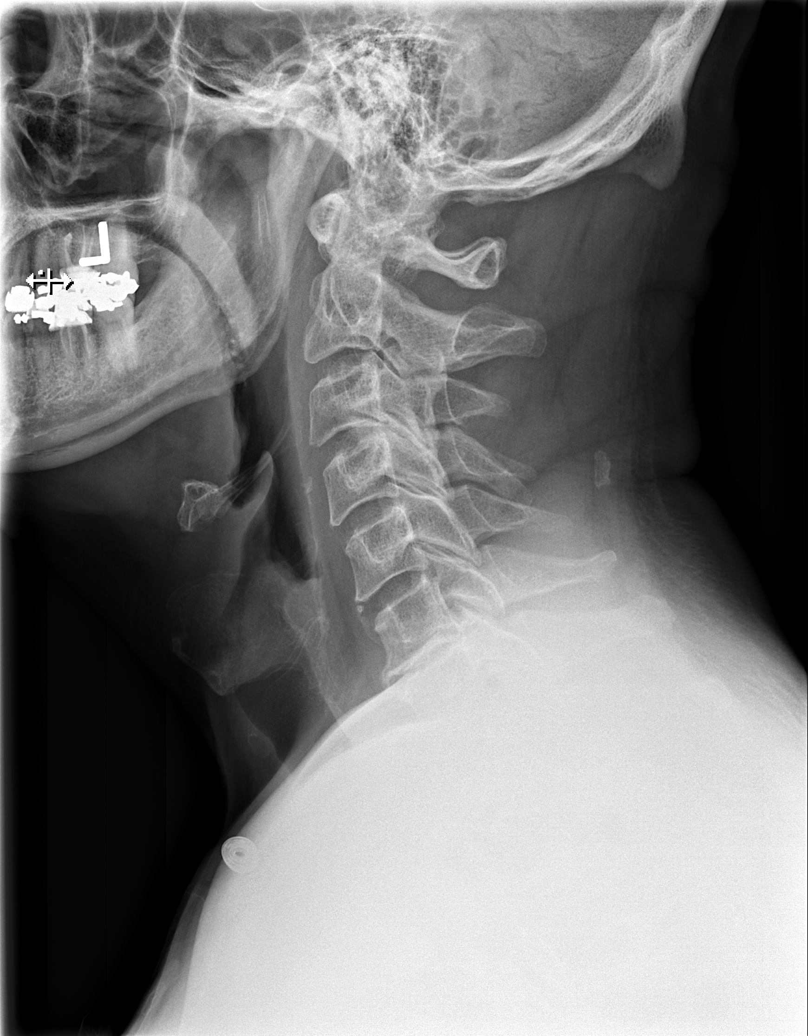

[w cervical spine lat (2 of 2)]
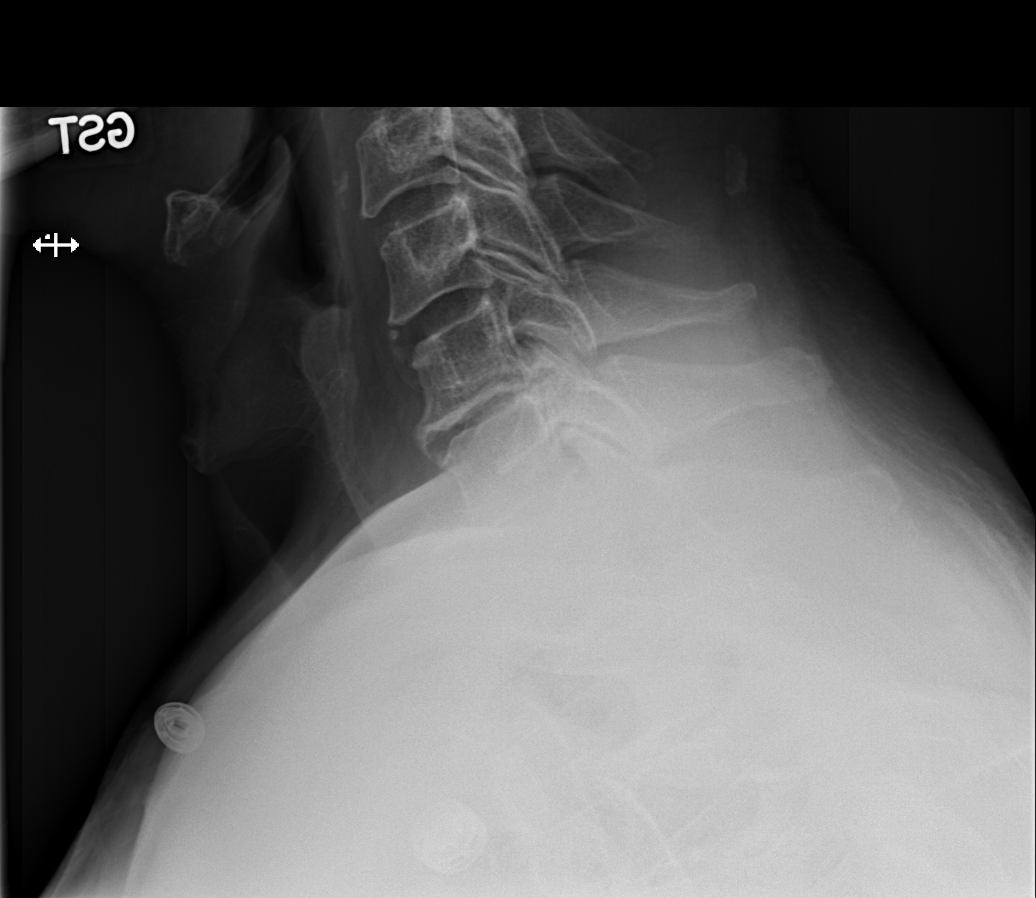

[w cervical spine ap_obl (1 of 2)]
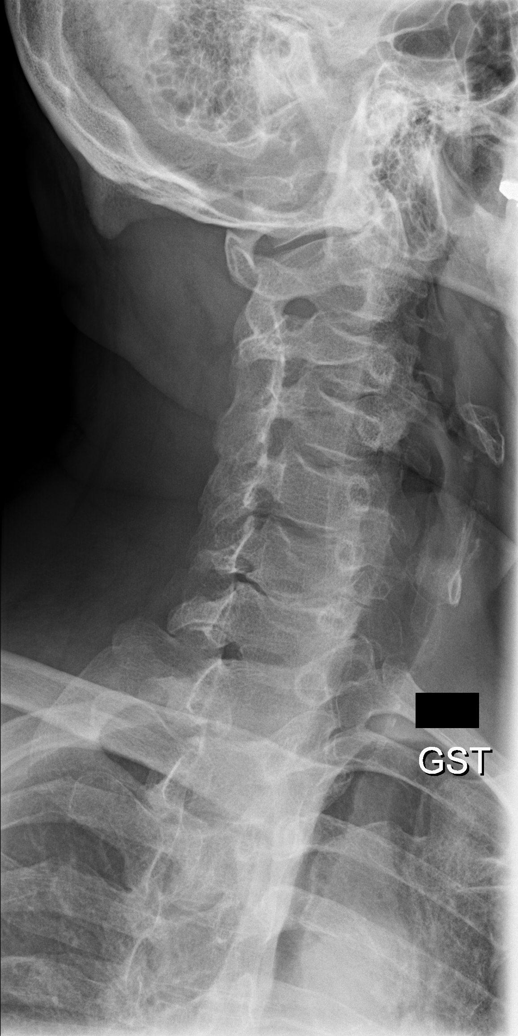

[w cervical spine ap_obl (2 of 2)]
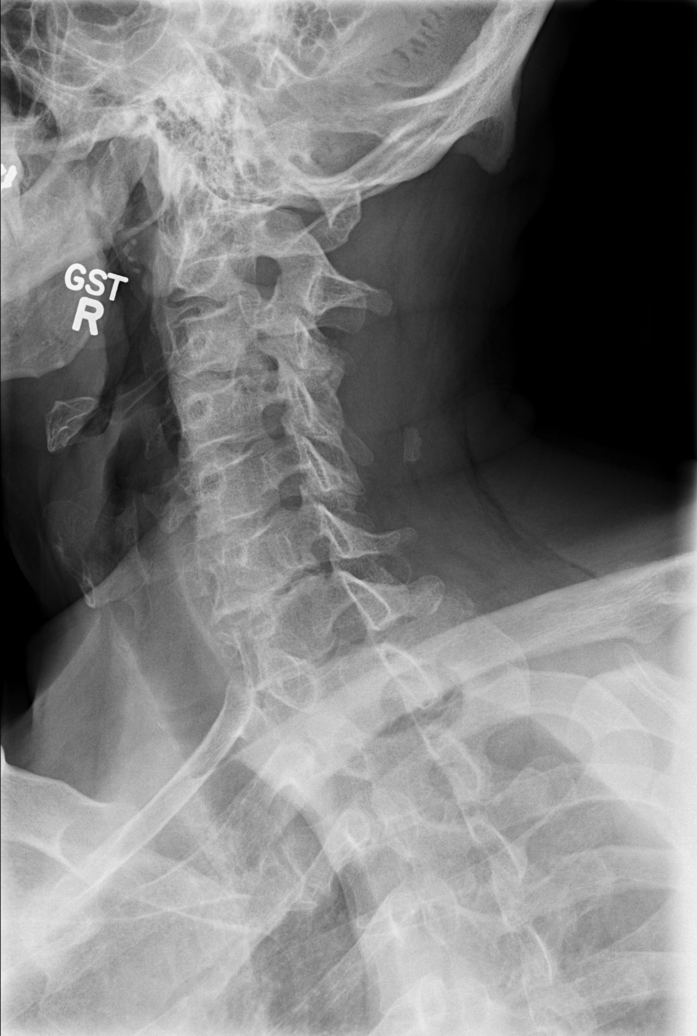

[w cervical spine odontoid (1 of 2)]
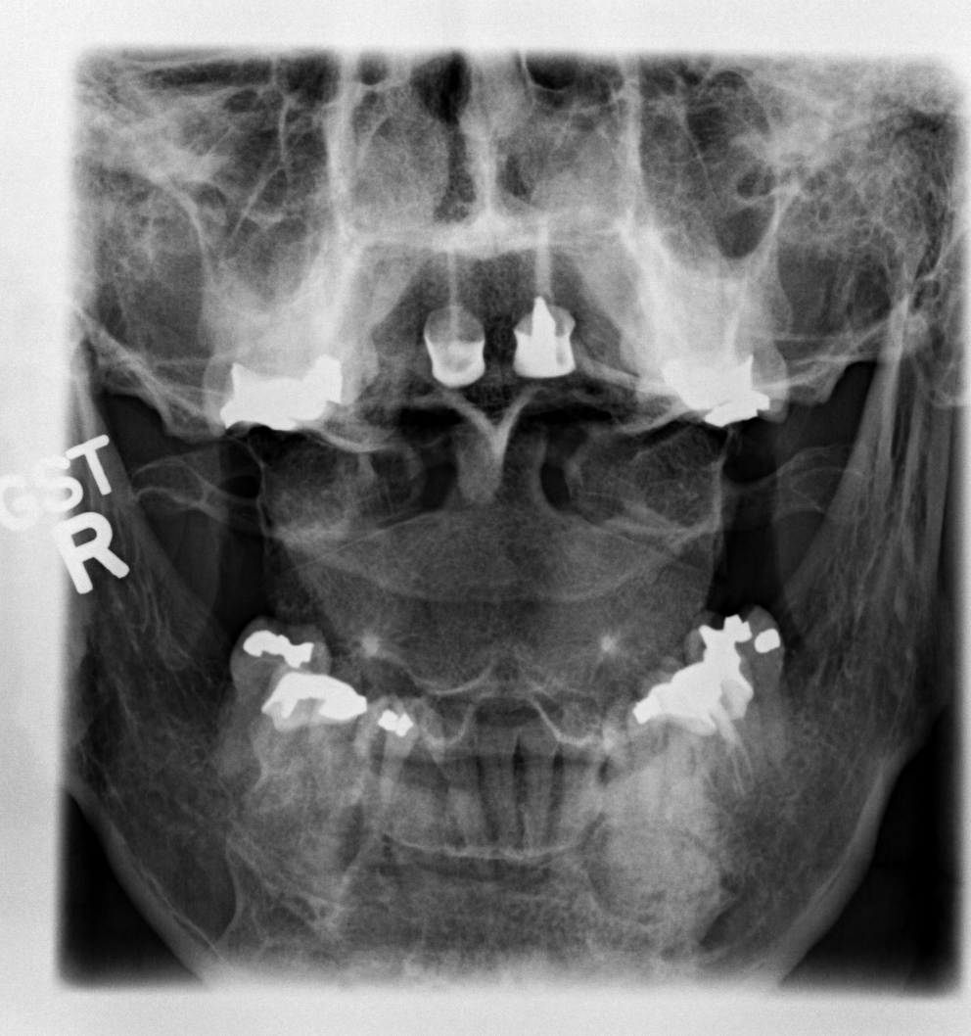

[w cervical spine ap]
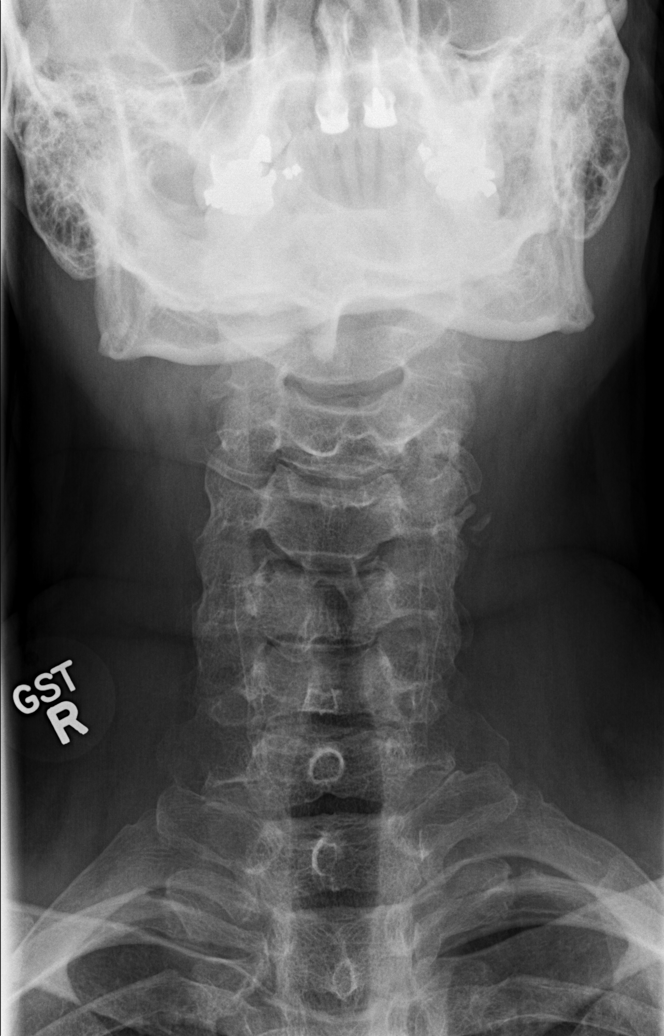

[w cervical spine odontoid (2 of 2)]
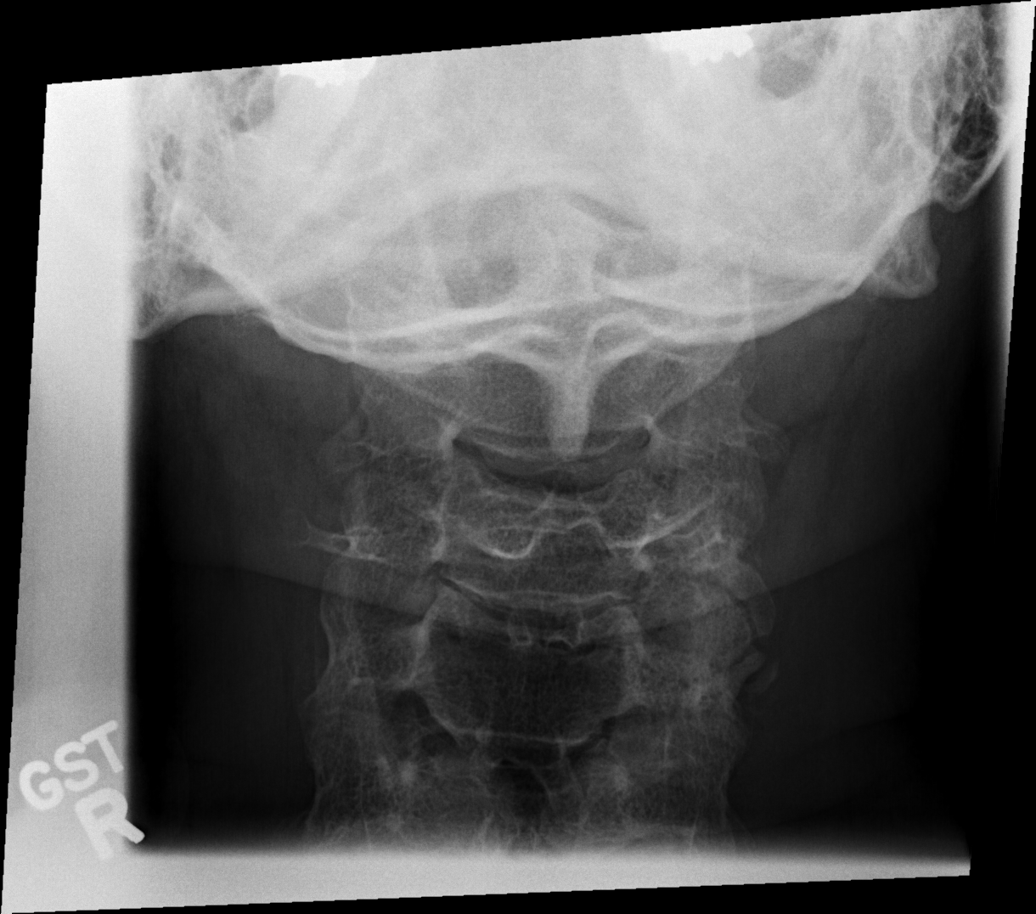

[7 of 7 positions shown; findings below may reference images not displayed]

FINDINGS: Normal alignment of the cervical spine.  The vertebral
body heights are preserved.  Disc space narrowing and ventral
spurring is noted.  Most severe at C5-6 and C6-7.  There is no
fracture or subluxation identified.
IMPRESSION: 1.  Cervical spondylosis.

## 2014-01-16 IMAGING — CT CT HEART SCORING
3 of 4 series · 11 of 20 positions shown, 13 images · non-contrast
Comparison: No priors.

CLINICAL DATA: 61-year-old male with history of chest pain.

CT HEART WITHOUT CALCIUM SCORING

[Series 2: cac score scan · axial · 0.49mm/px · z∈[-217,-147]mm · 3 of 56 slices shown]
[im 14/56  vessel]
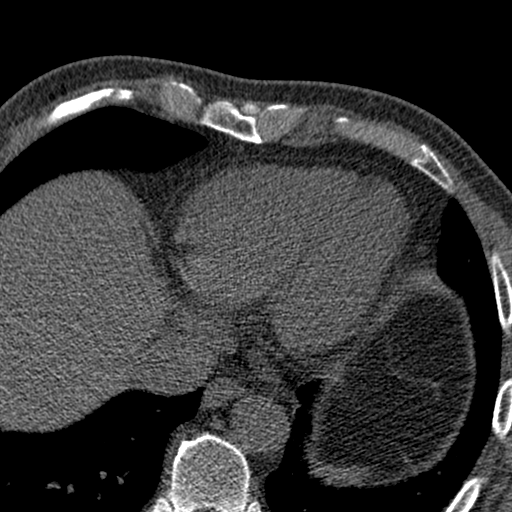
[im 28/56  vessel]
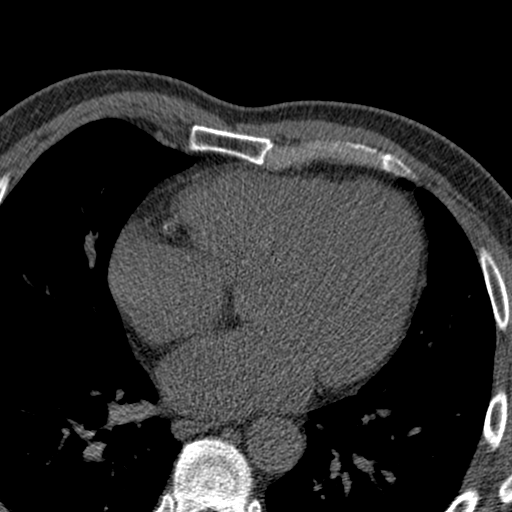
[im 42/56  vessel]
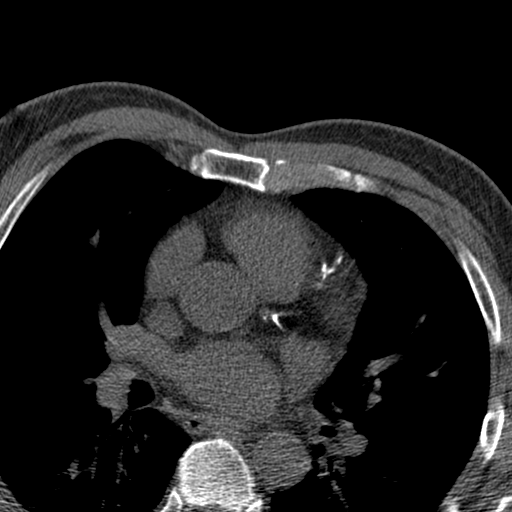

[Series 3: cac thin · axial · 0.49mm/px · z∈[-231,-131]mm · 6 of 112 slices shown, 8 images]
[im 16/112  vessel]
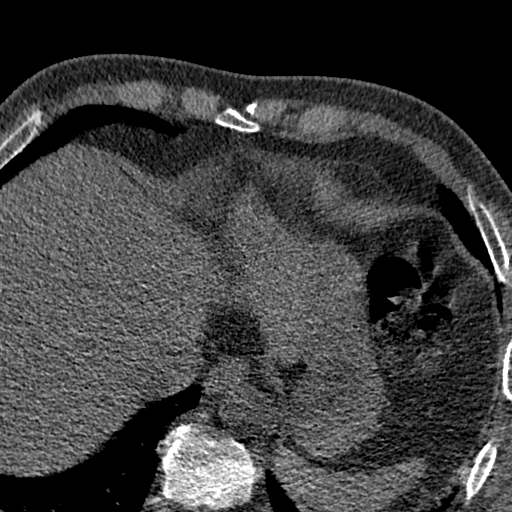
[im 16/112  lung]
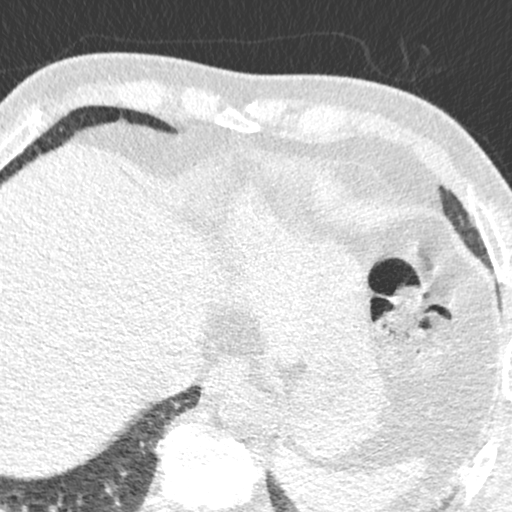
[im 32/112  vessel]
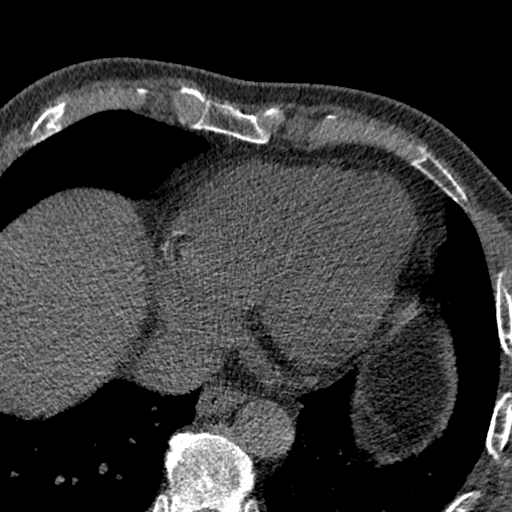
[im 48/112  vessel]
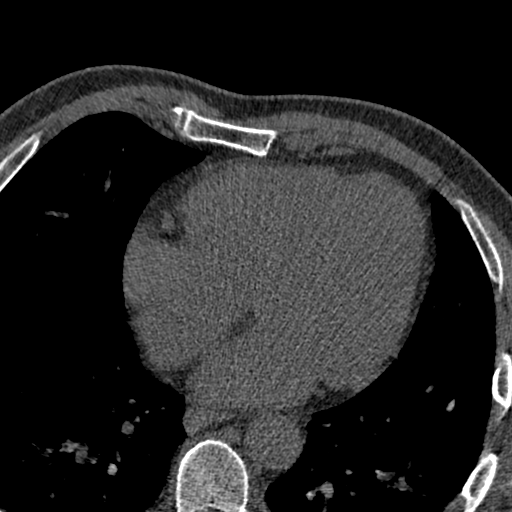
[im 64/112  vessel]
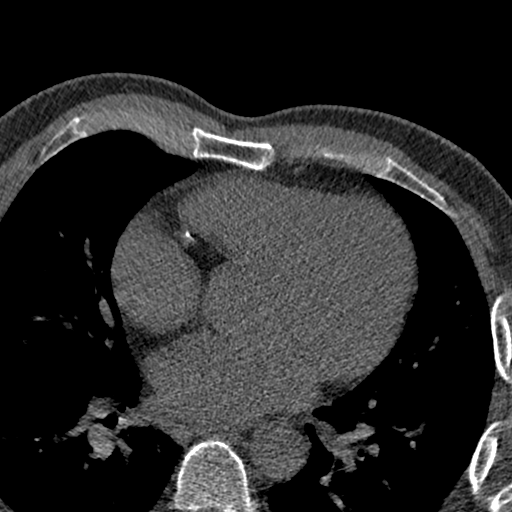
[im 80/112  vessel]
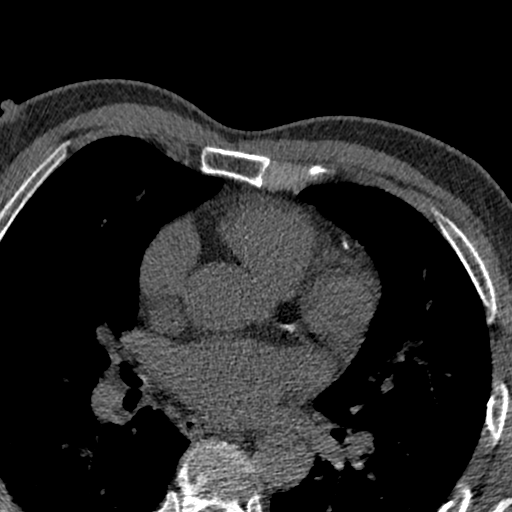
[im 80/112  lung]
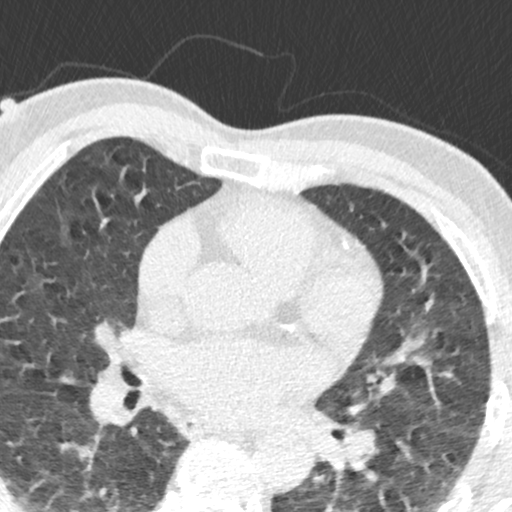
[im 96/112  vessel]
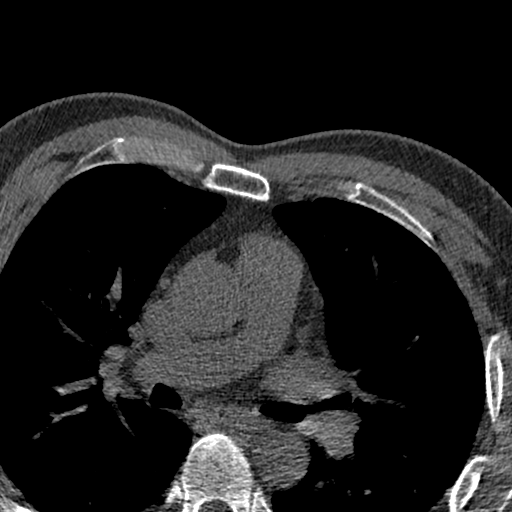

[Series 4: full fov chest · axial · 0.68mm/px · z∈[-205,-160]mm · 2 of 56 slices shown]
[im 19/56  vessel]
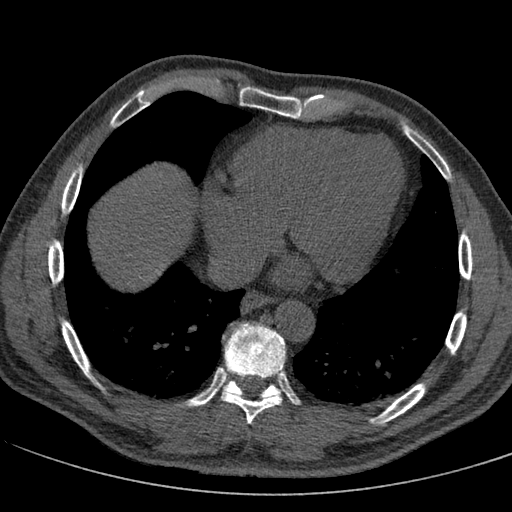
[im 37/56  vessel]
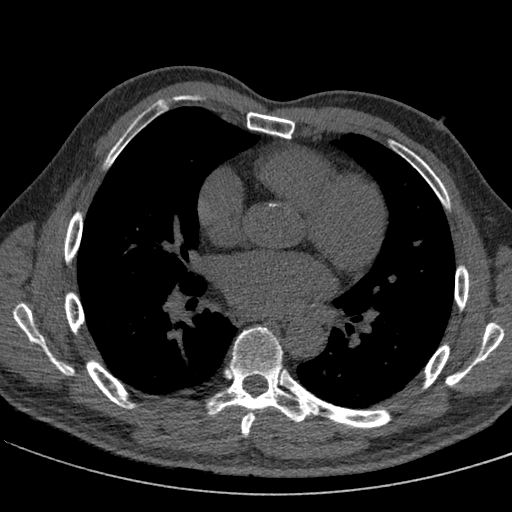

[11 of 20 positions shown; findings below may reference images not displayed]

Comment:  The examination was originally planned to be a full
cardiac CTA, however, after initial calcium scoring the extent of
calcification in the coronary arteries was such that the CT
examination was likely to be limited or nondiagnostic.
Accordingly, the examination was terminated and changed to a
calcium score.
FINDINGS: Calcium score:
Left main:              9
LAD:                    527
Left Circumflex:        136
RCA:                    717

Total Coronary Artery Calcium Score:  1,389
Percentile Rank:  98th
Arterial [AGE]  ([URL]
[URL]

Additional Findings:  A 6 mm subpleural nodule in the medial aspect
of the right middle lobe (image 21 of series six).  Within the
visualized portions of the thorax there is no acute consolidative
airspace disease, pleural effusion or pneumothorax.  Visualized
portions of the upper abdomen are unremarkable. There are no
aggressive appearing lytic or blastic lesions noted in the
visualized portions of the skeleton.
IMPRESSION: 1.  Patient's total coronary artery calcium score is 1,389 which is
98 percentile for patient's of matched age, gender and
race/ethnicity.  Based upon this, the patient's calculated arterial
age is that of a [AGE] male.
2.  Given the patient's acute symptomatology, further evaluation by
Cardiology is recommended at this time.   Assessment for potential
risk factor modification, dietary therapy or pharmacologic therapy
may be warranted, if clinically indicated.
3.  6 mm subpleural pulmonary nodule in the medial aspect of the
right middle lobe. If the patient is at high risk for bronchogenic
carcinoma, follow-up chest CT at 6-12 months is recommended.  If
the patient is at low risk for bronchogenic carcinoma, follow-up
chest CT at 12 months is recommended.  This recommendation follows
the consensus statement: Guidelines for Management of Small
Pulmonary Nodules Detected on CT Scans: A Statement from the

Findings were discussed with the [REDACTED] mid level on 04/27/2012 at the

## 2014-01-26 ENCOUNTER — Other Ambulatory Visit: Payer: Self-pay | Admitting: Cardiology

## 2014-05-23 ENCOUNTER — Encounter (HOSPITAL_COMMUNITY): Payer: Self-pay | Admitting: Cardiology

## 2014-06-04 ENCOUNTER — Other Ambulatory Visit: Payer: Self-pay | Admitting: Cardiology

## 2014-06-10 ENCOUNTER — Ambulatory Visit (INDEPENDENT_AMBULATORY_CARE_PROVIDER_SITE_OTHER): Payer: BC Managed Care – PPO | Admitting: Cardiology

## 2014-06-10 ENCOUNTER — Encounter: Payer: Self-pay | Admitting: Cardiology

## 2014-06-10 VITALS — BP 120/82 | HR 58 | Ht 76.0 in | Wt 239.8 lb

## 2014-06-10 DIAGNOSIS — I10 Essential (primary) hypertension: Secondary | ICD-10-CM

## 2014-06-10 DIAGNOSIS — E785 Hyperlipidemia, unspecified: Secondary | ICD-10-CM

## 2014-06-10 NOTE — Assessment & Plan Note (Signed)
Controlled.  

## 2014-06-10 NOTE — Addendum Note (Signed)
Addended by: Lamar Laundry on: 06/10/2014 04:23 PM   Modules accepted: Orders

## 2014-06-10 NOTE — Progress Notes (Signed)
06/10/2014 [name] [name]   Sep 27, 1950  [id number]  Primary Physician Delia Chimes, NP Primary Cardiologist: Dr Aundra Dubin  HPI:  63 y/o with a PMH of CAD s/p CABG Nov 13 (LIMA to LAD, SVG to D2, seq SVG to PLV/PDA, SVG to OM). He presented initially in 2013 after being stung by yellow jackets- he had no chest pain. He was found to have a STEMI when he presented to the ER then. He was admitted 12/16/13 with chest soreness. Troponin were negative x 3. He had an exercise Myoview 12/17/13. His stress was adequate, he walked 10 minutes of the Bruce protocol without chest pain. He did have PVCs and 1mm ST depression in V2 and AVL. Images suggested AL ischemia and he underwent elective cath 12/18/13. This revealed occlusion of his vein grafts and a patent LIMA-LAD. His LVF was normal. The plan is for medical Rx. Plavix and Imdur have been added. Dr Tamala Julian notes his cath was technically difficult through the Lt radial artery and he suggests this approach not be used in the future.            He has done well since. He is walking at the mall daily. 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 He recently had sinusitis and was put on ABs and Mucinex by his PCP.    Current Outpatient Prescriptions  Medication Sig Dispense Refill  . acetaminophen (TYLENOL) 325 MG tablet Take 650 mg by mouth every 6 (six) hours as needed for headache.    Marland Kitchen aspirin EC 81 MG tablet Take 81 mg by mouth daily.    Marland Kitchen atorvastatin (LIPITOR) 80 MG tablet Take 80 mg by mouth daily at 6 PM.    . atorvastatin (LIPITOR) 80 MG tablet take 1 tablet by mouth once daily AT 6:00PM AS DIRECTED 30 tablet 1  . clarithromycin (BIAXIN) 500 MG tablet Take 500 mg by mouth 2 (two) times daily. for 10 days  0  . clopidogrel (PLAVIX) 75 MG tablet Take 1 tablet (75 mg total) by mouth daily with breakfast. 30 tablet 11  . Coenzyme Q10 (CO Q 10) 100 MG CAPS Take 200 mg by mouth daily. 30 capsule 6  . fish oil-omega-3 fatty acids 1000 MG capsule Take 1 g by  mouth daily.    . isosorbide mononitrate (IMDUR) 30 MG 24 hr tablet Take 1 tablet (30 mg total) by mouth daily. 30 tablet 11  . lisinopril (PRINIVIL,ZESTRIL) 5 MG tablet take 1 tablet by mouth once daily 30 tablet 5  . metoprolol (LOPRESSOR) 50 MG tablet Take 1 tablet (50 mg total) by mouth daily. 90 tablet 3  . nitroGLYCERIN (NITROSTAT) 0.4 MG SL tablet Place 1 tablet (0.4 mg total) under the tongue every 5 (five) minutes as needed for chest pain. 25 tablet 2   No current facility-administered medications for this visit.    Allergies  Allergen Reactions  . Penicillins Hives, Itching and Rash    History   Social History  . Marital Status: Married    Spouse Name: N/A    Number of Children: N/A  . Years of Education: N/A   Occupational History  . Not on file.   Social History Main Topics  . Smoking status: Never Smoker   . Smokeless tobacco: Not on file  . Alcohol Use: Yes     Comment: maybe 1 beer q 2 weeks if that  . Drug Use: No  . Sexual Activity: Yes   Other Topics Concern  . Not on  file   Social History Narrative     Review of Systems: General: negative for chills, fever, night sweats or weight changes.  Cardiovascular: negative for chest pain, dyspnea on exertion, edema, orthopnea, palpitations, paroxysmal nocturnal dyspnea or shortness of breath Dermatological: negative for rash Respiratory: negative for cough or wheezing Urologic: negative for hematuria Abdominal: negative for nausea, vomiting, diarrhea, bright red blood per rectum, melena, or hematemesis Neurologic: negative for visual changes, syncope, or dizziness All other systems reviewed and are otherwise negative except as noted above.    Blood pressure 120/82, pulse 58, height 6\' 4"  (1.93 m), weight 239 lb 12.8 oz (108.773 kg).  General appearance: alert, cooperative and no distress Neck: no carotid bruit and no JVD Lungs: clear to auscultation bilaterally Heart: regular rate and rhythm  EKG  NSR, SB-58, LAD, incomplete RBBB, one PVC  ASSESSMENT AND PLAN:   CABG x 18 Apr 2012- occluded SVGs 12/18/13 No angina, walking for exercise daily  Essential hypertension Controlled  Dyslipidemia Due for lipids, LFTs   PLAN  Same Rx, check lipids and LFTs, f/u Dr Aundra Dubin in 6 months.  Kimisha Eunice KPA-C 06/10/2014 10:45 AM

## 2014-06-10 NOTE — Assessment & Plan Note (Signed)
No angina, walking for exercise daily

## 2014-06-10 NOTE — Assessment & Plan Note (Signed)
Due for lipids, LFTs

## 2014-06-10 NOTE — Patient Instructions (Signed)
Your physician recommends that you continue on your current medications as directed. Please refer to the Current Medication list given to you today.  Your physician recommends that you return for a FASTING lipid profile and cmet on 06/11/14 between 7:30am-5:15pm  Your physician wants you to follow-up in: 6 months with Dr.McLean You will receive a reminder letter in the mail two months in advance. If you don't receive a letter, please call our office to schedule the follow-up appointment.

## 2014-06-11 ENCOUNTER — Other Ambulatory Visit (INDEPENDENT_AMBULATORY_CARE_PROVIDER_SITE_OTHER): Payer: BC Managed Care – PPO | Admitting: *Deleted

## 2014-06-11 DIAGNOSIS — E785 Hyperlipidemia, unspecified: Secondary | ICD-10-CM

## 2014-06-11 LAB — COMPREHENSIVE METABOLIC PANEL
ALT: 33 U/L (ref 0–53)
AST: 26 U/L (ref 0–37)
Albumin: 3.7 g/dL (ref 3.5–5.2)
Alkaline Phosphatase: 78 U/L (ref 39–117)
BUN: 19 mg/dL (ref 6–23)
CO2: 29 mEq/L (ref 19–32)
Calcium: 9.3 mg/dL (ref 8.4–10.5)
Chloride: 102 mEq/L (ref 96–112)
Creatinine, Ser: 1.2 mg/dL (ref 0.4–1.5)
GFR: 64.45 mL/min (ref >60.00)
Glucose, Bld: 138 mg/dL — ABNORMAL HIGH (ref 70–99)
Potassium: 3.9 mEq/L (ref 3.5–5.1)
Sodium: 138 mEq/L (ref 135–145)
Total Bilirubin: 0.5 mg/dL (ref 0.2–1.2)
Total Protein: 7.6 g/dL (ref 6.0–8.3)

## 2014-06-11 LAB — LIPID PANEL
Cholesterol: 124 mg/dL (ref 0–200)
HDL: 38 mg/dL — ABNORMAL LOW (ref >39.00)
LDL Cholesterol: 69 mg/dL (ref 0–99)
NonHDL: 86
Total CHOL/HDL Ratio: 3
Triglycerides: 85 mg/dL (ref 0.0–149.0)
VLDL: 17 mg/dL (ref 0.0–40.0)

## 2014-06-12 ENCOUNTER — Other Ambulatory Visit: Payer: Self-pay | Admitting: Cardiology

## 2014-07-30 ENCOUNTER — Other Ambulatory Visit: Payer: Self-pay | Admitting: Cardiology

## 2014-08-05 ENCOUNTER — Other Ambulatory Visit: Payer: Self-pay | Admitting: Cardiology

## 2014-11-28 ENCOUNTER — Ambulatory Visit (INDEPENDENT_AMBULATORY_CARE_PROVIDER_SITE_OTHER): Payer: BLUE CROSS/BLUE SHIELD | Admitting: Cardiology

## 2014-11-28 ENCOUNTER — Encounter: Payer: Self-pay | Admitting: Cardiology

## 2014-11-28 VITALS — BP 120/60 | HR 61 | Ht 76.0 in | Wt 238.8 lb

## 2014-11-28 DIAGNOSIS — I25810 Atherosclerosis of coronary artery bypass graft(s) without angina pectoris: Secondary | ICD-10-CM | POA: Diagnosis not present

## 2014-11-28 DIAGNOSIS — I10 Essential (primary) hypertension: Secondary | ICD-10-CM

## 2014-11-28 DIAGNOSIS — I2510 Atherosclerotic heart disease of native coronary artery without angina pectoris: Secondary | ICD-10-CM | POA: Diagnosis not present

## 2014-11-28 DIAGNOSIS — E785 Hyperlipidemia, unspecified: Secondary | ICD-10-CM

## 2014-11-28 MED ORDER — ISOSORBIDE MONONITRATE ER 30 MG PO TB24: 30.0000 mg | ORAL_TABLET | Freq: Every day | ORAL | Status: DC

## 2014-11-28 MED ORDER — NITROGLYCERIN 0.4 MG SL SUBL: 0.4000 mg | SUBLINGUAL_TABLET | SUBLINGUAL | Status: DC | PRN

## 2014-11-28 MED ORDER — CLOPIDOGREL BISULFATE 75 MG PO TABS: 75.0000 mg | ORAL_TABLET | Freq: Every day | ORAL | Status: DC

## 2014-11-28 NOTE — Progress Notes (Signed)
Patient ID: [name] [name], male   DOB: 02/12/1951, 64 y.o.   MRN: [id number] PCP: Delia Chimes  64 yo with history of CAD s/p recent CABG presents for cardiology followup.  [name] [name] presented in 11/13 with unstable angina-type symptoms.  LHC showed severe 3VD.  CABG was done the next day. Post-operative course was complicated by atrial fibrillation that converted to NSR with amiodarone.  Atrial fibrillation has not recurred post-op, and he is off amiodarone and coumadin.  Patient had a chest pain with an abnormal Cardiolite in 7/15.  LHC was done, showing occlusion of all SVGs but patent LIMA-LAD.  EF was normal.  He was managed medically.   [name] [name] is doing well.  No exertional chest pain or dyspnea.  No palpitations.  He walks for exercise.  He can push mow his (large) yard.  No lightheadedness.   Labs (11/13): K 4.5, creatinine 1.13 Labs (12/13): TSH normal, LFTs normal Labs (2/14): LDL 59, HDL 39 Labs (12/14): LDL 56, HDL 38 Lbas (12/15): LDL 69, HDL 38, K 3.9, creatinine 1.2  PMH: 1. HTN 2. Type II DM 3. Hyperlipidemia 4. CAD: Unstable angina 11/13 with LHC showing severe 3 vessel disease.  He had CABG [name]) with LIMA-LAD, SVG-D2, seq SVG-PLV/PDA, SVG-OM.  Echo (11/13) with EF 60%, mild LAE.  Abnormal stress test in 7/15 with LHC showing occluded SVG-RCA, occluded SVG-D, occluded SVG-OM, and patent LIMA-LAD. Medical management.  5. Atrial fibrillation: Post-op CABG.   SH: Semi-retired, does some landscaping work.  Married.  Nonsmoker, lives in [place].   FH: Mother and father both had CABG in their 28s.   ROS: All systems reviewed and negative except as per HPI.    Current Outpatient Prescriptions  Medication Sig Dispense Refill  . acetaminophen (TYLENOL) 325 MG tablet Take 650 mg by mouth every 6 (six) hours as needed for headache.    Marland Kitchen aspirin EC 81 MG tablet Take 81 mg by mouth daily.    Marland Kitchen atorvastatin (LIPITOR) 80 MG tablet Take 80 mg by mouth daily at 6 PM.     . clopidogrel (PLAVIX) 75 MG tablet Take 1 tablet (75 mg total) by mouth daily with breakfast. 30 tablet 11  . Coenzyme Q10 (CO Q 10) 100 MG CAPS Take 200 mg by mouth daily. 30 capsule 6  . fish oil-omega-3 fatty acids 1000 MG capsule Take 1 g by mouth daily.    . isosorbide mononitrate (IMDUR) 30 MG 24 hr tablet Take 1 tablet (30 mg total) by mouth daily. 30 tablet 11  . lisinopril (PRINIVIL,ZESTRIL) 5 MG tablet take 1 tablet by mouth once daily 30 tablet 3  . metoprolol (LOPRESSOR) 50 MG tablet take 1 tablet by mouth once daily 90 tablet 3  . nitroGLYCERIN (NITROSTAT) 0.4 MG SL tablet Place 1 tablet (0.4 mg total) under the tongue every 5 (five) minutes as needed for chest pain. 25 tablet 2   No current facility-administered medications for this visit.   BP 120/60 mmHg  Pulse 61  Ht 6\' 4"  (1.93 m)  Wt 238 lb 12.8 oz (108.319 kg)  BMI 29.08 kg/m2 General: NAD Neck: No JVD, no thyromegaly or thyroid nodule.  Lungs: Clear to auscultation bilaterally with normal respiratory effort. CV: Nondisplaced PMI.  Heart regular S1/S2, no S3/S4, no murmur.  No peripheral edema.  No carotid bruit.  Normal pedal pulses.  Abdomen: Soft, nontender, no hepatosplenomegaly, no distention.  Neurologic: Alert and oriented x 3.  Psych: Normal affect. Extremities: No clubbing  or cyanosis.   Assessment/Plan: 1. CAD: LHC in 7/15 with occluded SVGs but patent LIMA.  No chest pain or dyspnea.   - Continue ASA 81, atorva 80, Plavix, lisinopril.  - He is taking metoprolol 50 mg daily.  I will change to 25 mg bid. - I will get an echo to make sure that LV systolic function is preserved despite loss of vein grafts.  2. Atrial fibrillation: Post-op CABG.  No atrial fibrillation documented since post-op period.  He is not on coumadin.  If he has a future recurrence, he will need long-term anticoagulation.  3. Hyperlipidemia: Continue atorvastatin, good lipids in 12/15.   4. HTN: BP controlled.   Loralie Champagne 11/28/2014

## 2014-11-28 NOTE — Patient Instructions (Signed)
Medication Instructions:  Your physician recommends that you continue on your current medications as directed. Please refer to the Current Medication list given to you today.   Labwork: NONE  Testing/Procedures: Your physician has requested that you have an echocardiogram. Echocardiography is a painless test that uses sound waves to create images of your heart. It provides your doctor with information about the size and shape of your heart and how well your heart's chambers and valves are working. This procedure takes approximately one hour. There are no restrictions for this procedure. IN 6 MONTHS BEFORE F/U APPT WITH Aundra Dubin   Follow-Up: Your physician wants you to follow-up in: 6 months with Dr. Kendall Flack will receive a reminder letter in the mail two months in advance. If you don't receive a letter, please call our office to schedule the follow-up appointment.   Any Other Special Instructions Will Be Listed Below (If Applicable).

## 2014-11-29 ENCOUNTER — Other Ambulatory Visit: Payer: Self-pay | Admitting: Cardiology

## 2014-12-05 ENCOUNTER — Other Ambulatory Visit: Payer: Self-pay | Admitting: Cardiology

## 2014-12-23 ENCOUNTER — Telehealth: Payer: Self-pay | Admitting: *Deleted

## 2014-12-23 NOTE — Telephone Encounter (Signed)
-----   Message from Lynett Fish sent at 12/18/2014  8:14 AM EDT ----- Regarding: ECHO Berdine Dance  I have called patient numerous time with NO return calls back. I removed order.   12/17/14 [facility] FOR PT TO CALL AND SCH/CC 12/12/14-left message to return call to schedule/lm 12/03/14 [facility] FOR PT TO CALL AND SCH/CC

## 2015-04-06 ENCOUNTER — Other Ambulatory Visit: Payer: Self-pay | Admitting: Cardiology

## 2015-04-07 ENCOUNTER — Other Ambulatory Visit: Payer: Self-pay

## 2015-04-07 MED ORDER — ATORVASTATIN CALCIUM 80 MG PO TABS: ORAL_TABLET | ORAL | Status: DC

## 2015-06-13 ENCOUNTER — Other Ambulatory Visit: Payer: Self-pay | Admitting: Cardiology

## 2015-09-03 ENCOUNTER — Other Ambulatory Visit: Payer: Self-pay | Admitting: Cardiology

## 2015-09-14 ENCOUNTER — Emergency Department
Admission: EM | Admit: 2015-09-14 | Discharge: 2015-09-14 | Disposition: A | Discharge status: Home / Self Care | Payer: BLUE CROSS/BLUE SHIELD | Attending: Student | Admitting: Student

## 2015-09-14 ENCOUNTER — Encounter: Payer: Self-pay | Admitting: Emergency Medicine

## 2015-09-14 DIAGNOSIS — Y999 Unspecified external cause status: Secondary | ICD-10-CM | POA: Insufficient documentation

## 2015-09-14 DIAGNOSIS — S61210A Laceration without foreign body of right index finger without damage to nail, initial encounter: Secondary | ICD-10-CM | POA: Diagnosis not present

## 2015-09-14 DIAGNOSIS — Z79899 Other long term (current) drug therapy: Secondary | ICD-10-CM | POA: Insufficient documentation

## 2015-09-14 DIAGNOSIS — Z23 Encounter for immunization: Secondary | ICD-10-CM | POA: Diagnosis not present

## 2015-09-14 DIAGNOSIS — I2510 Atherosclerotic heart disease of native coronary artery without angina pectoris: Secondary | ICD-10-CM | POA: Insufficient documentation

## 2015-09-14 DIAGNOSIS — Y9389 Activity, other specified: Secondary | ICD-10-CM | POA: Diagnosis not present

## 2015-09-14 DIAGNOSIS — Y929 Unspecified place or not applicable: Secondary | ICD-10-CM | POA: Insufficient documentation

## 2015-09-14 DIAGNOSIS — W268XXA Contact with other sharp object(s), not elsewhere classified, initial encounter: Secondary | ICD-10-CM | POA: Insufficient documentation

## 2015-09-14 DIAGNOSIS — S60419A Abrasion of unspecified finger, initial encounter: Secondary | ICD-10-CM

## 2015-09-14 DIAGNOSIS — E119 Type 2 diabetes mellitus without complications: Secondary | ICD-10-CM | POA: Diagnosis not present

## 2015-09-14 DIAGNOSIS — Z7982 Long term (current) use of aspirin: Secondary | ICD-10-CM | POA: Insufficient documentation

## 2015-09-14 DIAGNOSIS — S61219A Laceration without foreign body of unspecified finger without damage to nail, initial encounter: Secondary | ICD-10-CM

## 2015-09-14 DIAGNOSIS — I10 Essential (primary) hypertension: Secondary | ICD-10-CM | POA: Insufficient documentation

## 2015-09-14 DIAGNOSIS — S60412A Abrasion of right middle finger, initial encounter: Secondary | ICD-10-CM | POA: Diagnosis not present

## 2015-09-14 MED ORDER — BACITRACIN-NEOMYCIN-POLYMYXIN 400-5-5000 EX OINT
TOPICAL_OINTMENT | Freq: Once | CUTANEOUS | Status: AC
  Administered 2015-09-14: 1 via TOPICAL
  Filled 2015-09-14: qty 2

## 2015-09-14 MED ORDER — TETANUS-DIPHTH-ACELL PERTUSSIS 5-2.5-18.5 LF-MCG/0.5 IM SUSP
0.5000 mL | Freq: Once | INTRAMUSCULAR | Status: AC
  Administered 2015-09-14: 0.5 mL via INTRAMUSCULAR

## 2015-09-14 MED ORDER — CEPHALEXIN 500 MG PO CAPS: 500.0000 mg | ORAL_CAPSULE | Freq: Three times a day (TID) | ORAL | Status: AC

## 2015-09-14 MED ORDER — LIDOCAINE HCL (PF) 1 % IJ SOLN
INTRAMUSCULAR | Status: AC
  Administered 2015-09-14: 2 mL
  Filled 2015-09-14: qty 5

## 2015-09-14 MED ORDER — TETANUS-DIPHTH-ACELL PERTUSSIS 5-2.5-18.5 LF-MCG/0.5 IM SUSP
INTRAMUSCULAR | Status: AC
  Administered 2015-09-14: 0.5 mL via INTRAMUSCULAR
  Filled 2015-09-14: qty 0.5

## 2015-09-14 MED ORDER — LIDOCAINE HCL (PF) 1 % IJ SOLN
2.0000 mL | Freq: Once | INTRAMUSCULAR | Status: AC
  Administered 2015-09-14: 2 mL

## 2015-09-14 NOTE — ED Notes (Signed)
Patient sustained laceration to right: 2nd and 3rd fingers 14 hours ago. Patient states that he unwrapped it this AM and felt that the bleeding was enough to warrant an ER visit. Bleeding controlled in triage. Patient ambulatory, NAD noted

## 2015-09-14 NOTE — ED Provider Notes (Signed)
CSN: [id number]     Arrival date & time 09/14/15  [id number] History   First MD Initiated Contact with Patient 09/14/15 0848     Chief Complaint  Patient presents with  . Finger Injury     (Consider location/radiation/quality/duration/timing/severity/associated sxs/prior Treatment) HPI  65 year old male presents to emergency department for evaluation of laceration to the right second digit and abrasion to the right third digit. Laceration occurred 14 hours ago, he injured the area while lifting a refrigerator out of a truck. Cut his hand on a sharp object. Tetanus is not up-to-date. Last night at time of laceration area was thoroughly irrigated with tap water. Dressing was applied with Neosporin. This morning he had continued bleeding after changing the dressing, decided to come to the emergency department. Denies any numbness or tingling or limited range of motion. His pain is 0 out of 10.  Past Medical History  Diagnosis Date  . Hypertension   . Coronary artery disease 2013    CABG  . Diabetes mellitus without complication ([place])   . [id number])    Past Surgical History  Procedure Laterality Date  . Cardiac catheterization  12/18/13    Occluded SVGs  . Inguinal hernia repair      right   . Coronary artery bypass graft  04/28/2012    Procedure: CORONARY ARTERY BYPASS GRAFTING (CABG);  Surgeon: Ivin Poot, MD;  Location: [place];  Service: Open Heart Surgery;  Laterality: N/A;  Right saphenous vein used for bypass grafting. Five  bypasses performed including left mammary artery.   . Left heart catheterization with coronary angiogram N/A 04/27/2012    Procedure: LEFT HEART CATHETERIZATION WITH CORONARY ANGIOGRAM;  Surgeon: Larey Dresser, MD;  Location: [facility] C[facility] LAB;  Service: Cardiovascular;  Laterality: N/A;  . Left heart catheterization with coronary/graft angiogram N/A 12/18/2013    Procedure: LEFT HEART CATHETERIZATION WITH Beatrix Fetters;  Surgeon: Sinclair Grooms, MD;   Location: [facility] CATH LAB;  Service: Cardiovascular;  Laterality: N/A;   Family History  Problem Relation Age of Onset  . CAD Mother   . Heart attack Mother   . CAD Father   . Diabetes Father   . Heart attack Father    Social History  Substance Use Topics  . Smoking status: Never Smoker   . Smokeless tobacco: None  . Alcohol Use: Yes     Comment: maybe 1 beer q 2 weeks if that    Review of Systems  Constitutional: Negative.  Negative for fever, chills, activity change and appetite change.  HENT: Negative for congestion, ear pain, mouth sores, rhinorrhea, sinus pressure, sore throat and trouble swallowing.   Eyes: Negative for photophobia, pain and discharge.  Respiratory: Negative for cough, chest tightness and shortness of breath.   Cardiovascular: Negative for chest pain and leg swelling.  Gastrointestinal: Negative for nausea, vomiting, abdominal pain, diarrhea and abdominal distention.  Genitourinary: Negative for dysuria and difficulty urinating.  Musculoskeletal: Negative for back pain, arthralgias and gait problem.  Skin: Positive for wound. Negative for color change.  Neurological: Negative for dizziness and headaches.  Hematological: Negative for adenopathy.  Psychiatric/Behavioral: Negative for behavioral problems and agitation.      Allergies  Penicillins  Home Medications   Prior to Admission medications   Medication Sig Start Date End Date Taking? Authorizing Provider  acetaminophen (TYLENOL) 325 MG tablet Take 650 mg by mouth every 6 (six) hours as needed for headache.    Historical Provider, MD  aspirin EC 81  MG tablet Take 81 mg by mouth daily.    Historical Provider, MD  atorvastatin (LIPITOR) 80 MG tablet Take 80 mg by mouth daily at 6 PM.    Historical Provider, MD  atorvastatin (LIPITOR) 80 MG tablet take 1 tablet by mouth once daily AT 6PM as directed 04/07/15   Larey Dresser, MD  cephALEXin (KEFLEX) 500 MG capsule Take 1 capsule (500 mg total) by  mouth 3 (three) times daily. 09/14/15 09/24/15  Duanne Guess, PA-C  clopidogrel (PLAVIX) 75 MG tablet Take 1 tablet (75 mg total) by mouth daily with breakfast. 11/28/14   Larey Dresser, MD  Coenzyme Q10 (CO Q 10) 100 MG CAPS Take 200 mg by mouth daily. 10/23/12   Larey Dresser, MD  fish oil-omega-3 fatty acids 1000 MG capsule Take 1 g by mouth daily.    Historical Provider, MD  isosorbide mononitrate (IMDUR) 30 MG 24 hr tablet Take 1 tablet (30 mg total) by mouth daily. 11/28/14   Larey Dresser, MD  lisinopril (PRINIVIL,ZESTRIL) 5 MG tablet take 1 tablet by mouth once daily 11/29/14   Larey Dresser, MD  metoprolol (LOPRESSOR) 50 MG tablet take 1 tablet by mouth once daily 09/04/15   Larey Dresser, MD  nitroGLYCERIN (NITROSTAT) 0.4 MG SL tablet Place 1 tablet (0.4 mg total) under the tongue every 5 (five) minutes as needed for chest pain. 11/28/14   Larey Dresser, MD   BP 138/80 mmHg  Pulse 59  Temp(Src) 97.7 F (36.5 C) (Oral)  Resp 18  Ht 6\' 4"  (1.93 m)  Wt 108.863 kg  BMI 29.23 kg/m2  SpO2 97% Physical Exam  Constitutional: He is oriented to person, place, and time. He appears well-developed and well-nourished.  HENT:  Head: Normocephalic and atraumatic.  Eyes: Conjunctivae and EOM are normal. Pupils are equal, round, and reactive to light.  Neck: Normal range of motion. Neck supple.  Cardiovascular: Normal rate and intact distal pulses.   Pulmonary/Chest: Effort normal. No respiratory distress.  Musculoskeletal:  Examination of the right index finger shows patient has a radial aspect laceration to the distal phalanx and middle phalanx on the volar aspect. Both lacerations are approximately 2 cm in length. Both are considered a flap and wound margins are not exactly well aligned. He has full flexion and extension. Sensation is intact throughout. 2+ cap refill. No sign of foreign body. Patient also has an abrasion to the radial aspect of the right third digit, there is no gaping.  Wound margins are well aligned, mild bleeding. Patient has full active range of motion of all 5 digits.  Neurological: He is alert and oriented to person, place, and time.  Skin: Skin is warm and dry.  Psychiatric: He has a normal mood and affect. His behavior is normal. Judgment and thought content normal.    ED Course  Procedures (including critical care time) LACERATION REPAIR Performed by: Feliberto Gottron Authorized by: Feliberto Gottron Consent: Verbal consent obtained. Risks and benefits: risks, benefits and alternatives were discussed Consent given by: patient Patient identity confirmed: provided demographic data Prepped and Draped in normal sterile fashion Wound explored  Laceration Location: Right index finger  Laceration Length: 4 cm  No Foreign Bodies seen or palpated  Anesthesia: local infiltration  Local anesthetic: lidocaine 1% % without epinephrine, right index finger digital block   Anesthetic total: 2 ml  Irrigation method: syringe Amount of cleaning: standard  Skin closure: Simple interrupted, 5-0 nylon   Number  of sutures: 5   Technique: Simple interrupted   Patient tolerance: Patient tolerated the procedure well with no immediate complications.  Labs Review Labs Reviewed - No data to display  Imaging Review No results found. I have personally reviewed and evaluated these images and lab results as part of my medical decision-making.   EKG Interpretation None      MDM   Final diagnoses:  Finger laceration, initial encounter  Finger abrasion, initial encounter    65 year old male with laceration to the right index finger and abrasion to the right third digit. Patient with gaping skin flap, no tendon deficits noted. Wounds were loosely sutured and repaired. Bleeding well controlled. No signs of infection. Patient placed on prophylactic antibiotics. Tetanus was updated in the emergency department. Patient will follow-up in  8-10 days for suture removal.    Duanne Guess, PA-C 09/14/15 [id number]  Joanne Gavel, MD 09/14/15 1606

## 2015-09-14 NOTE — ED Notes (Signed)
NAD noted at time of D/C. Pt denies questions or concerns. Pt ambulatory to the lobby at this time.  

## 2015-09-14 NOTE — Discharge Instructions (Signed)
Abrasion An abrasion is a cut or scrape on the outer surface of your skin. An abrasion does not extend through all of the layers of your skin. It is important to care for your abrasion properly to prevent infection. CAUSES Most abrasions are caused by falling on or gliding across the ground or another surface. When your skin rubs on something, the outer and inner layer of skin rubs off.  SYMPTOMS A cut or scrape is the main symptom of this condition. The scrape may be bleeding, or it may appear red or pink. If there was an associated fall, there may be an underlying bruise. DIAGNOSIS An abrasion is diagnosed with a physical exam. TREATMENT Treatment for this condition depends on how large and deep the abrasion is. Usually, your abrasion will be cleaned with water and mild soap. This removes any dirt or debris that may be stuck. An antibiotic ointment may be applied to the abrasion to help prevent infection. A bandage (dressing) may be placed on the abrasion to keep it clean. You may also need a tetanus shot. HOME CARE INSTRUCTIONS Medicines  Take or apply medicines only as directed by your health care provider.  If you were prescribed an antibiotic ointment, finish all of it even if you start to feel better. Wound Care  Clean the wound with mild soap and water 2-3 times per day or as directed by your health care provider. Pat your wound dry with a clean towel. Do not rub it.  There are many different ways to close and cover a wound. Follow instructions from your health care provider about:  Wound care.  Dressing changes and removal.  Check your wound every day for signs of infection. Watch for:  Redness, swelling, or pain.  Fluid, blood, or pus. General Instructions  Keep the dressing dry as directed by your health care provider. Do not take baths, swim, use a hot tub, or do anything that would put your wound underwater until your health care provider approves.  If there is  swelling, raise (elevate) the injured area above the level of your heart while you are sitting or lying down.  Keep all follow-up visits as directed by your health care provider. This is important. SEEK MEDICAL CARE IF:  You received a tetanus shot and you have swelling, severe pain, redness, or bleeding at the injection site.  Your pain is not controlled with medicine.  You have increased redness, swelling, or pain at the site of your wound. SEEK IMMEDIATE MEDICAL CARE IF:  You have a red streak going away from your wound.  You have a fever.  You have fluid, blood, or pus coming from your wound.  You notice a bad smell coming from your wound or your dressing.   This information is not intended to replace advice given to you by your health care provider. Make sure you discuss any questions you have with your health care provider.   Document Released: 03/10/2005 Document Revised: 02/19/2015 Document Reviewed: 05/29/2014 Elsevier Interactive Patient Education 2016 [place], Adult A laceration is a cut that goes through all layers of the skin. The cut also goes into the tissue that is right under the skin. Some cuts heal on their own. Others need to be closed with stitches (sutures), staples, skin adhesive strips, or wound glue. Taking care of your cut lowers your risk of infection and helps your cut to heal better. HOW TO TAKE CARE OF YOUR CUT For stitches or  staples:  Keep the wound clean and dry.  If you were given a bandage (dressing), you should change it at least one time per day or as told by your doctor. You should also change it if it gets wet or dirty.  Keep the wound completely dry for the first 24 hours or as told by your doctor. After that time, you may take a shower or a bath. However, make sure that the wound is not soaked in water until after the stitches or staples have been removed.  Clean the wound one time each day or as told by your  doctor:  Wash the wound with soap and water.  Rinse the wound with water until all of the soap comes off.  Pat the wound dry with a clean towel. Do not rub the wound.  After you clean the wound, put a thin layer of antibiotic ointment on it as told by your doctor. This ointment:  Helps to prevent infection.  Keeps the bandage from sticking to the wound.  Have your stitches or staples removed as told by your doctor. If your doctor used skin adhesive strips:   Keep the wound clean and dry.  If you were given a bandage, you should change it at least one time per day or as told by your doctor. You should also change it if it gets dirty or wet.  Do not get the skin adhesive strips wet. You can take a shower or a bath, but be careful to keep the wound dry.  If the wound gets wet, pat it dry with a clean towel. Do not rub the wound.  Skin adhesive strips fall off on their own. You can trim the strips as the wound heals. Do not remove any strips that are still stuck to the wound. They will fall off after a while. If your doctor used wound glue:  Try to keep your wound dry, but you may briefly wet it in the shower or bath. Do not soak the wound in water, such as by swimming.  After you take a shower or a bath, gently pat the wound dry with a clean towel. Do not rub the wound.  Do not do any activities that will make you really sweaty until the skin glue has fallen off on its own.  Do not apply liquid, cream, or ointment medicine to your wound while the skin glue is still on.  If you were given a bandage, you should change it at least one time per day or as told by your doctor. You should also change it if it gets dirty or wet.  If a bandage is placed over the wound, do not let the tape for the bandage touch the skin glue.  Do not pick at the glue. The skin glue usually stays on for 5-10 days. Then, it falls off of the skin. General Instructions  To help prevent scarring, make sure  to cover your wound with sunscreen whenever you are outside after stitches are removed, after adhesive strips are removed, or when wound glue stays in place and the wound is healed. Make sure to wear a sunscreen of at least 30 SPF.  Take over-the-counter and prescription medicines only as told by your doctor.  If you were given antibiotic medicine or ointment, take or apply it as told by your doctor. Do not stop using the antibiotic even if your wound is getting better.  Do not scratch or pick at the wound.  Keep all follow-up visits as told by your doctor. This is important.  Check your wound every day for signs of infection. Watch for:  Redness, swelling, or pain.  Fluid, blood, or pus.  Raise (elevate) the injured area above the level of your heart while you are sitting or lying down, if possible. GET HELP IF:  You got a tetanus shot and you have any of these problems at the injection site:  Swelling.  Very bad pain.  Redness.  Bleeding.  You have a fever.  A wound that was closed breaks open.  You notice a bad smell coming from your wound or your bandage.  You notice something coming out of the wound, such as wood or glass.  Medicine does not help your pain.  You have more redness, swelling, or pain at the site of your wound.  You have fluid, blood, or pus coming from your wound.  You notice a change in the color of your skin near your wound.  You need to change the bandage often because fluid, blood, or pus is coming from the wound.  You start to have a new rash.  You start to have numbness around the wound. GET HELP RIGHT AWAY IF:  You have very bad swelling around the wound.  Your pain suddenly gets worse and is very bad.  You notice painful lumps near the wound or on skin that is anywhere on your body.  You have a red streak going away from your wound.  The wound is on your hand or foot and you cannot move a finger or toe like you usually  can.  The wound is on your hand or foot and you notice that your fingers or toes look pale or bluish.   This information is not intended to replace advice given to you by your health care provider. Make sure you discuss any questions you have with your health care provider.   Document Released: 11/17/2007 Document Revised: 10/15/2014 Document Reviewed: 05/27/2014 Elsevier Interactive Patient Education [employer].  Please return to the emergency department or primary care office in 10 days for suture removal. Clean wound daily with alcohol or peroxide. You may shower and get wet, do not submerge underwater.

## 2015-11-21 ENCOUNTER — Other Ambulatory Visit: Payer: Self-pay | Admitting: Cardiology

## 2015-12-05 ENCOUNTER — Other Ambulatory Visit: Payer: Self-pay | Admitting: *Deleted

## 2015-12-05 ENCOUNTER — Other Ambulatory Visit: Payer: Self-pay | Admitting: Cardiology

## 2015-12-23 ENCOUNTER — Other Ambulatory Visit: Payer: Self-pay | Admitting: Cardiology

## 2016-01-08 ENCOUNTER — Other Ambulatory Visit: Payer: Self-pay | Admitting: Cardiology

## 2016-01-20 ENCOUNTER — Other Ambulatory Visit: Payer: Self-pay | Admitting: Cardiology

## 2016-02-06 ENCOUNTER — Other Ambulatory Visit: Payer: Self-pay | Admitting: *Deleted

## 2016-02-06 ENCOUNTER — Other Ambulatory Visit: Payer: Self-pay | Admitting: Cardiology

## 2016-02-06 MED ORDER — ISOSORBIDE MONONITRATE ER 30 MG PO TB24
30.0000 mg | ORAL_TABLET | Freq: Every day | ORAL | 0 refills | Status: DC
Start: 2016-02-06 — End: 2016-02-06

## 2016-02-06 MED ORDER — METOPROLOL TARTRATE 50 MG PO TABS
50.0000 mg | ORAL_TABLET | Freq: Every day | ORAL | 0 refills | Status: DC
Start: 2016-02-06 — End: 2016-02-24

## 2016-02-06 MED ORDER — METOPROLOL TARTRATE 50 MG PO TABS: 50.0000 mg | ORAL_TABLET | Freq: Every day | ORAL | 0 refills | Status: DC

## 2016-02-06 MED ORDER — LISINOPRIL 5 MG PO TABS: 5.0000 mg | ORAL_TABLET | Freq: Every day | ORAL | 0 refills | Status: DC

## 2016-02-06 MED ORDER — ISOSORBIDE MONONITRATE ER 30 MG PO TB24: 30.0000 mg | ORAL_TABLET | Freq: Every day | ORAL | 0 refills | Status: DC

## 2016-02-06 NOTE — Telephone Encounter (Signed)
Please call our office to schedule a yearly appointment before anymore refills will be granted. [phone]. (3rd attempt)

## 2016-02-23 ENCOUNTER — Telehealth: Payer: Self-pay | Admitting: Cardiology

## 2016-02-24 ENCOUNTER — Other Ambulatory Visit: Payer: Self-pay | Admitting: *Deleted

## 2016-02-24 MED ORDER — METOPROLOL TARTRATE 50 MG PO TABS: 50.0000 mg | ORAL_TABLET | Freq: Every day | ORAL | 0 refills | Status: DC

## 2016-02-24 MED ORDER — CLOPIDOGREL BISULFATE 75 MG PO TABS: 75.0000 mg | ORAL_TABLET | Freq: Once | ORAL | 0 refills | Status: DC

## 2016-02-24 MED ORDER — LISINOPRIL 5 MG PO TABS: 5.0000 mg | ORAL_TABLET | Freq: Every day | ORAL | 0 refills | Status: DC

## 2016-02-24 MED ORDER — ISOSORBIDE MONONITRATE ER 30 MG PO TB24: 30.0000 mg | ORAL_TABLET | Freq: Every day | ORAL | 0 refills | Status: DC

## 2016-02-24 NOTE — Telephone Encounter (Signed)
Please contact him to schedule a follow up appt, can be with PA/NP. Once appt has been scheduled, refills can be provided until that appt time.

## 2016-03-04 ENCOUNTER — Other Ambulatory Visit: Payer: Self-pay | Admitting: Cardiology

## 2016-03-08 ENCOUNTER — Other Ambulatory Visit: Payer: Self-pay | Admitting: Cardiology

## 2016-04-05 ENCOUNTER — Other Ambulatory Visit: Payer: Self-pay | Admitting: Cardiology

## 2016-05-03 ENCOUNTER — Other Ambulatory Visit: Payer: Self-pay | Admitting: Cardiology

## 2016-06-02 ENCOUNTER — Encounter (INDEPENDENT_AMBULATORY_CARE_PROVIDER_SITE_OTHER): Payer: Self-pay

## 2016-06-02 ENCOUNTER — Encounter: Payer: Self-pay | Admitting: Cardiology

## 2016-06-02 ENCOUNTER — Ambulatory Visit (INDEPENDENT_AMBULATORY_CARE_PROVIDER_SITE_OTHER): Payer: BLUE CROSS/BLUE SHIELD | Admitting: Cardiology

## 2016-06-02 VITALS — BP 133/81 | HR 52 | Ht 76.0 in | Wt 243.0 lb

## 2016-06-02 DIAGNOSIS — I2510 Atherosclerotic heart disease of native coronary artery without angina pectoris: Secondary | ICD-10-CM | POA: Diagnosis not present

## 2016-06-02 DIAGNOSIS — E7800 Pure hypercholesterolemia, unspecified: Secondary | ICD-10-CM

## 2016-06-02 DIAGNOSIS — I255 Ischemic cardiomyopathy: Secondary | ICD-10-CM | POA: Diagnosis not present

## 2016-06-02 DIAGNOSIS — I10 Essential (primary) hypertension: Secondary | ICD-10-CM

## 2016-06-02 LAB — CBC WITH DIFFERENTIAL/PLATELET
Basophils Absolute: 37 cells/uL (ref 0–200)
Basophils Relative: 1 %
Eosinophils Absolute: 37 cells/uL (ref 15–500)
Eosinophils Relative: 1 %
HCT: 38.4 % — ABNORMAL LOW (ref 38.5–50.0)
Hemoglobin: 13.2 g/dL (ref 13.2–17.1)
Lymphocytes Relative: 39 %
Lymphs Abs: 1443 cells/uL (ref 850–3900)
MCH: 30.4 pg (ref 27.0–33.0)
MCHC: 34.4 g/dL (ref 32.0–36.0)
MCV: 88.5 fL (ref 80.0–100.0)
MPV: 11 fL (ref 7.5–12.5)
Monocytes Absolute: 333 cells/uL (ref 200–950)
Monocytes Relative: 9 %
Neutro Abs: 1850 cells/uL (ref 1500–7800)
Neutrophils Relative %: 50 %
Platelets: 140 10*3/uL (ref 140–400)
RBC: 4.34 MIL/uL (ref 4.20–5.80)
RDW: 13.6 % (ref 11.0–15.0)
WBC: 3.7 10*3/uL — ABNORMAL LOW (ref 3.8–10.8)

## 2016-06-02 LAB — COMPREHENSIVE METABOLIC PANEL
ALT: 34 U/L (ref 9–46)
AST: 40 U/L — ABNORMAL HIGH (ref 10–35)
Albumin: 3.9 g/dL (ref 3.6–5.1)
Alkaline Phosphatase: 66 U/L (ref 40–115)
BUN: 20 mg/dL (ref 7–25)
CO2: 26 mmol/L (ref 20–31)
Calcium: 9 mg/dL (ref 8.6–10.3)
Chloride: 103 mmol/L (ref 98–110)
Creat: 1.09 mg/dL (ref 0.70–1.25)
Glucose, Bld: 120 mg/dL — ABNORMAL HIGH (ref 65–99)
Potassium: 4.1 mmol/L (ref 3.5–5.3)
Sodium: 138 mmol/L (ref 135–146)
Total Bilirubin: 0.5 mg/dL (ref 0.2–1.2)
Total Protein: 6.5 g/dL (ref 6.1–8.1)

## 2016-06-02 LAB — LIPID PANEL
Cholesterol: 82 mg/dL (ref <200)
HDL: 27 mg/dL — ABNORMAL LOW (ref >40)
LDL Cholesterol: 36 mg/dL (ref <100)
Total CHOL/HDL Ratio: 3 Ratio (ref <5.0)
Triglycerides: 97 mg/dL (ref <150)
VLDL: 19 mg/dL (ref <30)

## 2016-06-02 MED ORDER — ISOSORBIDE MONONITRATE ER 30 MG PO TB24: 30.0000 mg | ORAL_TABLET | Freq: Every day | ORAL | 3 refills | Status: DC

## 2016-06-02 MED ORDER — NITROGLYCERIN 0.4 MG SL SUBL: 0.4000 mg | SUBLINGUAL_TABLET | SUBLINGUAL | 2 refills | Status: DC | PRN

## 2016-06-02 MED ORDER — ATORVASTATIN CALCIUM 80 MG PO TABS: ORAL_TABLET | ORAL | 3 refills | Status: DC

## 2016-06-02 MED ORDER — METOPROLOL TARTRATE 25 MG PO TABS: 25.0000 mg | ORAL_TABLET | Freq: Two times a day (BID) | ORAL | 3 refills | Status: DC

## 2016-06-02 MED ORDER — CLOPIDOGREL BISULFATE 75 MG PO TABS: 75.0000 mg | ORAL_TABLET | Freq: Every day | ORAL | 3 refills | Status: DC

## 2016-06-02 MED ORDER — LISINOPRIL 5 MG PO TABS: 5.0000 mg | ORAL_TABLET | Freq: Every day | ORAL | 3 refills | Status: DC

## 2016-06-02 NOTE — Patient Instructions (Addendum)
Medication Instructions:  Your physician recommends that you continue on your current medications as directed. Please refer to the Current Medication list given to you today.   Labwork: Lipid profile/CBCd/CMET today  Testing/Procedures: Your physician has requested that you have an echocardiogram. Echocardiography is a painless test that uses sound waves to create images of your heart. It provides your doctor with information about the size and shape of your heart and how well your heart's chambers and valves are working. This procedure takes approximately one hour. There are no restrictions for this procedure.    Follow-Up:  Your physician wants you to follow-up in: 1 year with Dr End in [place]. You will receive a reminder letter in the mail two months in advance. If you don't receive a letter, please call our office to schedule the follow-up appointment.         If you need a refill on your cardiac medications before your next appointment, please call your pharmacy.

## 2016-06-03 ENCOUNTER — Other Ambulatory Visit: Payer: BLUE CROSS/BLUE SHIELD

## 2016-06-03 NOTE — Progress Notes (Signed)
Patient ID: [name] [name], male   DOB: 27-Aug-1950, 65 y.o.   MRN: [id number] PCP: Delia Chimes  65 yo with history of CAD s/p recent CABG presents for cardiology followup.  Mr Labarca presented in 11/13 with unstable angina-type symptoms.  LHC showed severe 3VD.  CABG was done the next day. Post-operative course was complicated by atrial fibrillation that converted to NSR with amiodarone.  Atrial fibrillation has not recurred post-op, and he is off amiodarone and coumadin.  Patient had a chest pain with an abnormal Cardiolite in 7/15.  LHC was done, showing occlusion of all SVGs but patent LIMA-LAD.  EF was normal.  He was managed medically.   Mr [name] [name] is doing well.  No exertional chest pain or dyspnea.  No palpitations.  He walks for exercise.  He often gets 20-30,000 steps/day on his Fitbit.  Doing some weight lifting.  No lightheadedness.   Labs (11/13): K 4.5, creatinine 1.13 Labs (12/13): TSH normal, LFTs normal Labs (2/14): LDL 59, HDL 39 Labs (12/14): LDL 56, HDL 38 Labs (12/15): LDL 69, HDL 38, K 3.9, creatinine 1.2  ECG: NSR, LAFB, LVH  PMH: 1. HTN 2. Type II DM 3. Hyperlipidemia 4. CAD: Unstable angina 11/13 with LHC showing severe 3 vessel disease.  He had CABG [name]) with LIMA-LAD, SVG-D2, seq SVG-PLV/PDA, SVG-OM.  Echo (11/13) with EF 60%, mild LAE.  Abnormal stress test in 7/15 with LHC showing occluded SVG-RCA, occluded SVG-D, occluded SVG-OM, and patent LIMA-LAD. Medical management.  5. Atrial fibrillation: Post-op CABG.   SH: Semi-retired, does some landscaping work.  Married.  Nonsmoker, lives in [place].   [place]: Mother and father both had CABG in their 38s.   ROS: All systems reviewed and negative except as per HPI.    Current Outpatient Prescriptions  Medication Sig Dispense Refill  . acetaminophen (TYLENOL) 325 MG tablet Take 650 mg by mouth every 6 (six) hours as needed for headache.    Marland Kitchen aspirin EC 81 MG tablet Take 81 mg by mouth daily.    Marland Kitchen atorvastatin  (LIPITOR) 80 MG tablet take 1 tablet by mouth once daily AT 6 PM AS DIRECTED 90 tablet 3  . clopidogrel (PLAVIX) 75 MG tablet Take 1 tablet (75 mg total) by mouth daily. 90 tablet 3  . Coenzyme Q10 (CO Q 10) 100 MG CAPS Take 200 mg by mouth daily. 30 capsule 6  . fish oil-omega-3 fatty acids 1000 MG capsule Take 1 g by mouth daily.    . isosorbide mononitrate (IMDUR) 30 MG 24 hr tablet Take 1 tablet (30 mg total) by mouth daily. 90 tablet 3  . lisinopril (PRINIVIL,ZESTRIL) 5 MG tablet Take 1 tablet (5 mg total) by mouth daily. 90 tablet 3  . nitroGLYCERIN (NITROSTAT) 0.4 MG SL tablet Place 1 tablet (0.4 mg total) under the tongue every 5 (five) minutes as needed for chest pain. 25 tablet 2  . metoprolol tartrate (LOPRESSOR) 25 MG tablet Take 1 tablet (25 mg total) by mouth 2 (two) times daily. 180 tablet 3   No current facility-administered medications for this visit.    BP 133/81   Pulse (!) 52   Ht 6\' 4"  (1.93 m)   Wt 243 lb (110.2 kg)   BMI 29.58 kg/m  General: NAD Neck: No JVD, no thyromegaly or thyroid nodule.  Lungs: Clear to auscultation bilaterally with normal respiratory effort. CV: Nondisplaced PMI.  Heart regular S1/S2, no S3/S4, no murmur.  No peripheral edema.  No carotid bruit.  Normal pedal pulses.  Abdomen: Soft, nontender, no hepatosplenomegaly, no distention.  Neurologic: Alert and oriented x 3.  Psych: Normal affect. Extremities: No clubbing or cyanosis.   Assessment/Plan: 1. CAD: LHC in 7/15 with occluded SVGs but patent LIMA.  No chest pain or dyspnea.   - Continue ASA 81, atorva 80, Plavix, lisinopril.  - Continue metoprolol 25 mg bid. - I will get an echo to make sure that LV systolic function is preserved despite loss of vein grafts.  2. Atrial fibrillation: Post-op CABG.  No atrial fibrillation documented since post-op period.  He is not on coumadin.  If he has a future recurrence, he will need long-term anticoagulation.  3. Hyperlipidemia: Continue  atorvastatin, check lipids today.    4. HTN: BP controlled.   Loralie Champagne 06/03/2016

## 2016-06-23 ENCOUNTER — Ambulatory Visit (INDEPENDENT_AMBULATORY_CARE_PROVIDER_SITE_OTHER): Payer: BLUE CROSS/BLUE SHIELD

## 2016-06-23 ENCOUNTER — Other Ambulatory Visit: Payer: Self-pay

## 2016-06-23 DIAGNOSIS — I10 Essential (primary) hypertension: Secondary | ICD-10-CM

## 2016-06-23 DIAGNOSIS — I255 Ischemic cardiomyopathy: Secondary | ICD-10-CM | POA: Diagnosis not present

## 2017-05-24 ENCOUNTER — Other Ambulatory Visit: Payer: Self-pay | Admitting: Internal Medicine

## 2017-05-24 DIAGNOSIS — I255 Ischemic cardiomyopathy: Secondary | ICD-10-CM

## 2017-05-24 DIAGNOSIS — I10 Essential (primary) hypertension: Secondary | ICD-10-CM

## 2017-07-05 NOTE — Progress Notes (Signed)
Follow-up Outpatient Visit Date: 07/06/2017  Primary Care Provider: Patient, No Pcp Per No address on file  Chief Complaint: Follow-up coronary artery disease  HPI:  [name] [name] is a 67 y.o. year-old male with history of CAD s/p CABG with post-op a-fib, HTN, HLD, and DM2, who presents for follow-up of CAD. He was previously followed in our practice by Dr. Aundra Dubin, having last been seen in 05/2016. At that time he was doing well. He is know to have patent LIMA->LAD and occluded SVG's.  Today, [name] [name] reports that he has been feeling well except for some recent left shoulder pain that developed after he closed a door awkwardly.  He has been exercising using a stationary bike without limitations.  He denies chest pain, shortness of breath, palpitations, lightheadedness, orthopnea, and edema.  He has not glycerin.  He is compliant with his medications, including dual antiplatelet therapy with aspirin and clopidogrel.  --------------------------------------------------------------------------------------------------  Past Medical/Surgical History: 1. HTN 2. Type II DM 3. Hyperlipidemia 4. CAD: Unstable angina 11/13 with LHC showing severe 3 vessel disease.  He had CABG [name]) with LIMA-LAD, SVG-D2, seq SVG-PLV/PDA, SVG-OM.  Echo (11/13) with EF 60%, mild LAE.  Abnormal stress test in 7/15 with LHC showing occluded SVG-RCA, occluded SVG-D, occluded SVG-OM, and patent LIMA-LAD. Medical management.  5. Atrial fibrillation: Post-op CABG.   Current Meds  Medication Sig  . acetaminophen (TYLENOL) 325 MG tablet Take 650 mg by mouth every 6 (six) hours as needed for headache.  Marland Kitchen aspirin EC 81 MG tablet Take 81 mg by mouth daily.  Marland Kitchen atorvastatin (LIPITOR) 80 MG tablet TAKE 1 TABLET BY MOUTH AT 6PM AS DIRECTED  . clopidogrel (PLAVIX) 75 MG tablet TAKE 1 TABLET BY MOUTH EVERY DAY  . Coenzyme Q10 (CO Q 10) 100 MG CAPS Take 200 mg by mouth daily.  . fish oil-omega-3 fatty acids 1000 MG capsule  Take 1 g by mouth daily.  . isosorbide mononitrate (IMDUR) 30 MG 24 hr tablet TAKE 1 TABLET BY MOUTH EVERY DAY  . lisinopril (PRINIVIL,ZESTRIL) 5 MG tablet TAKE 1 TABLET BY MOUTH EVERY DAY  . metoprolol tartrate (LOPRESSOR) 25 MG tablet TAKE 1 TABLET BY MOUTH TWICE A DAY  . nitroGLYCERIN (NITROSTAT) 0.4 MG SL tablet Place 1 tablet (0.4 mg total) under the tongue every 5 (five) minutes as needed for chest pain.    Allergies: Penicillins  Social History   Socioeconomic History  . Marital status: Married    Spouse name: Not on file  . Number of children: Not on file  . Years of education: Not on file  . Highest education level: Not on file  Social Needs  . Financial resource strain: Not on file  . Food insecurity - worry: Not on file  . Food insecurity - inability: Not on file  . Transportation needs - medical: Not on file  . Transportation needs - non-medical: Not on file  Occupational History  . Not on file  Tobacco Use  . Smoking status: Never Smoker  . Smokeless tobacco: Never Used  Substance and Sexual Activity  . Alcohol use: Yes    Comment: maybe 1 beer q 2 weeks if that  . Drug use: No  . Sexual activity: Yes  Other Topics Concern  . Not on file  Social History Narrative  . Not on file    Family History  Problem Relation Age of Onset  . CAD Mother   . Heart attack Mother   . CAD Father   .  Diabetes Father   . Heart attack Father     Review of Systems: A 12-system review of systems was performed and was negative except as noted in the HPI.  --------------------------------------------------------------------------------------------------  Physical Exam: BP 138/80 (BP Location: Left Arm, Patient Position: Sitting, Cuff Size: Normal)   Pulse (!) 101   Ht 6\' 4"  (1.93 m)   Wt 249 lb (112.9 kg)   BMI 30.31 kg/m   General: Overweight man, seated comfortably in the exam room.  He is accompanied by his wife. HEENT: No conjunctival pallor or scleral icterus.  Moist mucous membranes.  OP clear. Neck: Supple without lymphadenopathy, thyromegaly, JVD, or HJR. No carotid bruit. Lungs: Normal work of breathing. Clear to auscultation bilaterally without wheezes or crackles. Heart: Irregularly irregular rate and rhythm without murmurs or rubs.  Nondisplaced PMI. Abd: Bowel sounds present. Soft, NT/ND without hepatosplenomegaly Ext: Trace pretibial edema bilaterally. Radial, PT, and DP pulses are 2+ bilaterally. Skin: Warm and dry without rash.  EKG: Atrial fibrillation (ventricular rate 101 bpm) with LVH and ST/T abnormality likely reflecting abnormal repolarization.  Lab Results  Component Value Date   WBC 3.7 (L) 06/02/2016   HGB 13.2 06/02/2016   HCT 38.4 (L) 06/02/2016   MCV 88.5 06/02/2016   PLT 140 06/02/2016    Lab Results  Component Value Date   NA 138 06/02/2016   K 4.1 06/02/2016   CL 103 06/02/2016   CO2 26 06/02/2016   BUN 20 06/02/2016   CREATININE 1.09 06/02/2016   GLUCOSE 120 (H) 06/02/2016   ALT 34 06/02/2016    Lab Results  Component Value Date   CHOL 82 06/02/2016   HDL 27 (L) 06/02/2016   LDLCALC 36 06/02/2016   TRIG 97 06/02/2016   CHOLHDL 3.0 06/02/2016    --------------------------------------------------------------------------------------------------  ASSESSMENT AND PLAN: Atrial fibrillation Atrial fibrillation noted today on exam and EKG, new from his last visit.  [name] [name] has a history of postoperative atrial fibrillation following CABG.  He is asymptomatic at this time and appears well compensated.  His CHADSVASC score is at least 4.  We have therefore agreed to start apixaban 5 mg twice daily.  I will discontinue clopidogrel but continue aspirin given his history of coronary artery disease.  Heart rate control is also suboptimal today.  We will therefore increase metoprolol tartrate to 50 mg twice daily.  I will have [name] [name] return in 1 month to assess his response to these medications.  If he remains  in atrial fibrillation, we set him up for a cardioversion.  In the meantime, I will obtain another transthoracic echocardiogram to exclude development of new structural abnormalities.  We will check a CMP, CBC, magnesium level and TSH.  Coronary artery disease with stable angina No chest pain, shortness of breath, or other symptoms to suggest worsening coronary insufficiency.  Continue with isosorbide mononitrate at current dose.  We will increase metoprolol tartrate to 50 mg twice daily, as above.  With addition of apixaban, I will discontinue clopidogrel but continue low-dose aspirin.  Hypertension Blood pressure mildly elevated today at 138/80.  We will increase metoprolol, as above.  Hyperlipidemia Goal LDL is less than 70.  I will check a lipid panel today.  Continue atorvastatin 80 mg daily for now.  Follow-up: Return to clinic in 1 month.  Nelva Bush, MD 07/06/2017 1:48 PM

## 2017-07-06 ENCOUNTER — Ambulatory Visit: Payer: Medicare HMO | Admitting: Internal Medicine

## 2017-07-06 ENCOUNTER — Encounter: Payer: Self-pay | Admitting: Internal Medicine

## 2017-07-06 VITALS — BP 138/80 | HR 101 | Ht 76.0 in | Wt 249.0 lb

## 2017-07-06 DIAGNOSIS — I10 Essential (primary) hypertension: Secondary | ICD-10-CM

## 2017-07-06 DIAGNOSIS — E785 Hyperlipidemia, unspecified: Secondary | ICD-10-CM

## 2017-07-06 DIAGNOSIS — I25118 Atherosclerotic heart disease of native coronary artery with other forms of angina pectoris: Secondary | ICD-10-CM | POA: Diagnosis not present

## 2017-07-06 DIAGNOSIS — I4891 Unspecified atrial fibrillation: Secondary | ICD-10-CM | POA: Diagnosis not present

## 2017-07-06 MED ORDER — APIXABAN 5 MG PO TABS: 5.0000 mg | ORAL_TABLET | Freq: Two times a day (BID) | ORAL | 3 refills | Status: DC

## 2017-07-06 MED ORDER — METOPROLOL TARTRATE 50 MG PO TABS: 50.0000 mg | ORAL_TABLET | Freq: Two times a day (BID) | ORAL | 3 refills | Status: DC

## 2017-07-06 NOTE — Patient Instructions (Signed)
Medication Instructions:  Your physician has recommended you make the following change in your medication:  1- STOP Plavix. 2- START Eliquis 5 mg  (1 tablet) by mouth two times a day. 2- INCREASE Metoprolol to 50 mg by mouth two times a day.   Labwork: Your physician recommends that you return for lab work in: TODAY (LIPID, CMET, CBC, Mg, TSH).   Testing/Procedures: Your physician has requested that you have an echocardiogram. Echocardiography is a painless test that uses sound waves to create images of your heart. It provides your doctor with information about the size and shape of your heart and how well your heart's chambers and valves are working. This procedure takes approximately one hour. There are no restrictions for this procedure.    Follow-Up: Your physician recommends that you schedule a follow-up appointment in: [place] APP.  If you need a refill on your cardiac medications before your next appointment, please call your pharmacy.    Echocardiogram An echocardiogram, or echocardiography, uses sound waves (ultrasound) to produce an image of your heart. The echocardiogram is simple, painless, obtained within a short period of time, and offers valuable information to your health care provider. The images from an echocardiogram can provide information such as:  Evidence of coronary artery disease (CAD).  Heart size.  Heart muscle function.  Heart valve function.  Aneurysm detection.  Evidence of a past heart attack.  Fluid buildup around the heart.  Heart muscle thickening.  Assess heart valve function.  Tell a health care provider about:  Any allergies you have.  All medicines you are taking, including vitamins, herbs, eye drops, creams, and over-the-counter medicines.  Any problems you or family members have had with anesthetic medicines.  Any blood disorders you have.  Any surgeries you have had.  Any medical conditions you  have.  Whether you are pregnant or may be pregnant. What happens before the procedure? No special preparation is needed. Eat and drink normally. What happens during the procedure?  In order to produce an image of your heart, gel will be applied to your chest and a wand-like tool (transducer) will be moved over your chest. The gel will help transmit the sound waves from the transducer. The sound waves will harmlessly bounce off your heart to allow the heart images to be captured in real-time motion. These images will then be recorded.  You may need an IV to receive a medicine that improves the quality of the pictures. What happens after the procedure? You may return to your normal schedule including diet, activities, and medicines, unless your health care provider tells you otherwise. This information is not intended to replace advice given to you by your health care provider. Make sure you discuss any questions you have with your health care provider. Document Released: 05/28/2000 Document Revised: 01/17/2016 Document Reviewed: 02/05/2013 Elsevier Interactive Patient Education  2017 [employer].

## 2017-07-07 ENCOUNTER — Telehealth: Payer: Self-pay | Admitting: Internal Medicine

## 2017-07-07 LAB — COMPREHENSIVE METABOLIC PANEL
ALT: 30 IU/L (ref 0–44)
AST: 23 IU/L (ref 0–40)
Albumin/Globulin Ratio: 1.5 (ref 1.2–2.2)
Albumin: 4.6 g/dL (ref 3.6–4.8)
Alkaline Phosphatase: 96 IU/L (ref 39–117)
BUN/Creatinine Ratio: 15 (ref 10–24)
BUN: 18 mg/dL (ref 8–27)
Bilirubin Total: 0.5 mg/dL (ref 0.0–1.2)
CO2: 22 mmol/L (ref 20–29)
Calcium: 9.9 mg/dL (ref 8.6–10.2)
Chloride: 105 mmol/L (ref 96–106)
Creatinine, Ser: 1.23 mg/dL (ref 0.76–1.27)
GFR calc Af Amer: 71 mL/min/{1.73_m2} (ref >59)
GFR calc non Af Amer: 61 mL/min/{1.73_m2} (ref >59)
Globulin, Total: 3.1 g/dL (ref 1.5–4.5)
Glucose: 146 mg/dL — ABNORMAL HIGH (ref 65–99)
Potassium: 4.3 mmol/L (ref 3.5–5.2)
Sodium: 139 mmol/L (ref 134–144)
Total Protein: 7.7 g/dL (ref 6.0–8.5)

## 2017-07-07 LAB — LIPID PANEL
Chol/HDL Ratio: 3.3 ratio (ref 0.0–5.0)
Cholesterol, Total: 121 mg/dL (ref 100–199)
HDL: 37 mg/dL — ABNORMAL LOW (ref >39)
LDL Calculated: 63 mg/dL (ref 0–99)
Triglycerides: 104 mg/dL (ref 0–149)
VLDL Cholesterol Cal: 21 mg/dL (ref 5–40)

## 2017-07-07 LAB — CBC
Hematocrit: 43.4 % (ref 37.5–51.0)
Hemoglobin: 14.7 g/dL (ref 13.0–17.7)
MCH: 30.4 pg (ref 26.6–33.0)
MCHC: 33.9 g/dL (ref 31.5–35.7)
MCV: 90 fL (ref 79–97)
Platelets: 223 10*3/uL (ref 150–379)
RBC: 4.83 x10E6/uL (ref 4.14–5.80)
RDW: 13.2 % (ref 12.3–15.4)
WBC: 7.1 10*3/uL (ref 3.4–10.8)

## 2017-07-07 LAB — MAGNESIUM: Magnesium: 2 mg/dL (ref 1.6–2.3)

## 2017-07-07 LAB — TSH: TSH: 3.08 u[IU]/mL (ref 0.450–4.500)

## 2017-07-07 NOTE — Telephone Encounter (Signed)
No answer. Left message to call back.   

## 2017-07-07 NOTE — Telephone Encounter (Signed)
Patient's wife calling back and is listed on DPR. Results of lab work given and she verbalized understanding.

## 2017-07-07 NOTE — Telephone Encounter (Signed)
Patient wife returning call for results

## 2017-07-19 ENCOUNTER — Other Ambulatory Visit: Payer: Self-pay | Admitting: Internal Medicine

## 2017-07-19 ENCOUNTER — Other Ambulatory Visit: Payer: Self-pay

## 2017-07-19 ENCOUNTER — Ambulatory Visit (INDEPENDENT_AMBULATORY_CARE_PROVIDER_SITE_OTHER): Payer: Medicare HMO

## 2017-07-19 DIAGNOSIS — I4891 Unspecified atrial fibrillation: Secondary | ICD-10-CM | POA: Diagnosis not present

## 2017-08-03 ENCOUNTER — Encounter: Payer: Self-pay | Admitting: Physician Assistant

## 2017-08-03 NOTE — Progress Notes (Signed)
Cardiology Office Note Date:  08/08/2017  Patient ID:  [name] [name], [name] [name] Nov 10, 1950, MRN [id number] PCP:  Patient, No Pcp Per  Cardiologist:  Dr. Saunders Revel, MD    Chief Complaint: Follow up Afib  History of Present Illness: [name] [name] is a 67 y.o. male with history of CAD s/p 4-vessel CABG in 2013 with post-operative Afib, PAF noted on 07/06/2017 on Eliquis, DM2, HTN, and HLD who presents for follow up of Afib.   He was previously followed by Dr. Aundra Dubin, though has more recently transitioned to Dr. Saunders Revel. LHC in 04/2012 showed severe 3-vessel CAD. He underwent CABG by Dr. [name] with LIMA-LAD, SVG-D2, sequential SVG-PLV/PDA, SVG-OM. Echo from 04/2012 showed EF 60% with mild left atrial enlargement. He had an abnormal stress test in 12/2013 with subsequent LHC in 12/2013 showing an occluded SVG-RCA, occluded SVG-D2, occluded SVG-OM and patent LIMA-LAD with medical management being advised. He is known to have a patent LIMA-LAD with occluded SVGs. He was most recently seen by Dr. Saunders Revel on 07/06/2017 and was doing reasonably well outside of some left shoulder pain that he developed after he closed a door awkwardly. He was compliant with DAPT (ASA and Plavix). He was noted to be in Afib with heart rate of 101 bpm at that visit. He was asymptomatic and well compensated. Given his CHADS2VASc of 4 he was started on Eliquis and Plavix was discontinued. He was continued on ASA given his history of CAD. His Lopressor was increased to 50 mg bid. Labs on 07/06/17 showed an unremarkable CBC, K+ 4.3, SCr 1.23, glucose 146, magnesium 2.0, TSH normal, LDL 63. TTE on 07/19/17 showed an EF of 50-55%, normal wall motion, Gr2DD, borderline enlarged ascending aorta, mildly dilated left atrium measuring 42 mm, mildly dilated RV with mildly reduced RV systolic function, mildly dilated right atrium.   He comes in doing well from a cardiac perspective today.  He recently resumed an exercise regimen of riding a stationary bike  and notes some soreness along the right side of the lower back and into the right hip.  Feels like he is tolerating Eliquis without any issues.  No melena, BRBPR, or hematuria.  No falls, dizziness, presyncope, or syncope.  Does not check blood pressure at home.  Compliant with all medications.  Weight stable.  No other concerns or questions at this time.   Past Medical History:  Diagnosis Date  . Coronary artery disease 2013   a. [place] 2013 w/ severe 3-V CAD; b. s/p 4-V CABG in 2013 (LIMA-LAD, VG-D2, seq VG-PLA/PDA, VG-OM; c. LHC 2015 patent LIMA-LAD, occluded VGs, med Rx  . Diabetes mellitus with complication ([place])   . Headache(784.0)   . HLD (hyperlipidemia)   . Hypertension   . PAF (paroxysmal atrial fibrillation) (HCC)    a. noted post-operatively following CABG in 2013; b. recurrence 06/2017; c. CHADS2VASc => 4 (HTN, age x 1, DM, vascular disease); d. On Eliquis    Past Surgical History:  Procedure Laterality Date  . CARDIAC CATHETERIZATION  12/18/13   Occluded SVGs  . CORONARY ARTERY BYPASS GRAFT  04/28/2012   Procedure: CORONARY ARTERY BYPASS GRAFTING (CABG);  Surgeon: Ivin Poot, MD;  Location: [facility];  Service: Open Heart Surgery;  Laterality: N/A;  Right saphenous vein used for bypass grafting. Five  bypasses performed including left mammary artery.   . INGUINAL HERNIA REPAIR     right   . LEFT HEART CATHETERIZATION WITH CORONARY ANGIOGRAM N/A 04/27/2012   Procedure: LEFT HEART  CATHETERIZATION WITH CORONARY ANGIOGRAM;  Surgeon: Larey Dresser, MD;  Location[facility] C[facility] LAB;  Service: Cardiovascular;  Laterality: N/A;  . LEFT HEART CATHETERIZATION WITH CORONARY/GRAFT ANGIOGRAM N/A 12/18/2013   Procedure: LEFT HEART CATHETERIZATION WITH Beatrix Fetters;  Surgeon: Sinclair Grooms, MD;  Location: [facility] CATH LAB;  Service: Cardiovascular;  Laterality: N/A;    Current Meds  Medication Sig  . acetaminophen (TYLENOL) 325 MG tablet Take 650 mg by mouth every 6 (six) hours as  needed for headache.  Marland Kitchen apixaban (ELIQUIS) 5 MG TABS tablet Take 1 tablet (5 mg total) by mouth 2 (two) times daily.  Marland Kitchen aspirin EC 81 MG tablet Take 81 mg by mouth daily.  Marland Kitchen atorvastatin (LIPITOR) 80 MG tablet TAKE 1 TABLET BY MOUTH AT 6PM AS DIRECTED  . Coenzyme Q10 (CO Q 10) 100 MG CAPS Take 200 mg by mouth daily.  . fish oil-omega-3 fatty acids 1000 MG capsule Take 1 g by mouth daily.  . isosorbide mononitrate (IMDUR) 30 MG 24 hr tablet TAKE 1 TABLET BY MOUTH EVERY DAY  . lisinopril (PRINIVIL,ZESTRIL) 5 MG tablet TAKE 1 TABLET BY MOUTH EVERY DAY  . metoprolol tartrate (LOPRESSOR) 50 MG tablet Take 1 tablet (50 mg total) by mouth 2 (two) times daily.  . nitroGLYCERIN (NITROSTAT) 0.4 MG SL tablet Place 1 tablet (0.4 mg total) under the tongue every 5 (five) minutes as needed for chest pain.    Allergies:   Penicillins   Social History:  The patient  reports that  has never smoked. he has never used smokeless tobacco. He reports that he drinks alcohol. He reports that he does not use drugs.   Family History:  The patient's family history includes CAD in his father and mother; Diabetes in his father; Heart attack in his father and mother.  ROS:   Review of Systems  Constitutional: Negative for chills, diaphoresis, fever, malaise/fatigue and weight loss.  HENT: Negative for congestion.   Eyes: Negative for discharge and redness.  Respiratory: Negative for cough, hemoptysis, sputum production, shortness of breath and wheezing.   Cardiovascular: Negative for chest pain, palpitations, orthopnea, claudication, leg swelling and PND.  Gastrointestinal: Negative for abdominal pain, blood in stool, heartburn, melena, nausea and vomiting.  Genitourinary: Negative for hematuria.  Musculoskeletal: Positive for joint pain and myalgias. Negative for falls.  Skin: Negative for rash.  Neurological: Negative for dizziness, tingling, tremors, sensory change, speech change, focal weakness, loss of  consciousness and weakness.  Endo/Heme/Allergies: Does not bruise/bleed easily.  Psychiatric/Behavioral: Negative for substance abuse. The patient is not nervous/anxious.   All other systems reviewed and are negative.    PHYSICAL EXAM:  VS:  BP (!) 158/80 (BP Location: Left Arm, Patient Position: Sitting, Cuff Size: Normal)   Pulse (!) 56   Ht 6\' 4"  (1.93 m)   Wt 252 lb (114.3 kg)   BMI 30.67 kg/m  BMI: Body mass index is 30.67 kg/m.  Physical Exam  Constitutional: He is oriented to person, place, and time. He appears well-developed and well-nourished.  HENT:  Head: Normocephalic and atraumatic.  Eyes: Right eye exhibits no discharge. Left eye exhibits no discharge.  Neck: Normal range of motion. No JVD present.  Cardiovascular: Regular rhythm, S1 normal, S2 normal and normal heart sounds. Bradycardia present. Exam reveals no distant heart sounds, no friction rub, no midsystolic click and no opening snap.  No murmur heard. Pulses:      Posterior tibial pulses are 2+ on the right side, and 2+  on the left side.  Pulmonary/Chest: Effort normal and breath sounds normal. No respiratory distress. He has no decreased breath sounds. He has no wheezes. He has no rales. He exhibits no tenderness.  Abdominal: Soft. He exhibits no distension. There is no tenderness.  Musculoskeletal: He exhibits edema.  Trace bilateral pretibial edema  Neurological: He is alert and oriented to person, place, and time.  Skin: Skin is warm and dry. No cyanosis. Nails show no clubbing.  Psychiatric: He has a normal mood and affect. His speech is normal and behavior is normal. Judgment and thought content normal.     EKG:  Was ordered and interpreted by me today. Shows sinus bradycardia with sinus arrhythmia, 56 bpm, incomplete right bundle branch block, left axis deviation, poor R wave progression  Recent Labs: 07/06/2017: ALT 30; BUN 18; Creatinine, Ser 1.23; Hemoglobin 14.7; Magnesium 2.0; Platelets 223;  Potassium 4.3; Sodium 139; TSH 3.080  07/06/2017: Chol/HDL Ratio 3.3; Cholesterol, Total 121; HDL 37; LDL Calculated 63; Triglycerides 104   CrCl cannot be calculated (Patient's most recent lab result is older than the maximum 21 days allowed.).   Wt Readings from Last 3 Encounters:  08/08/17 252 lb (114.3 kg)  07/06/17 249 lb (112.9 kg)  06/02/16 243 lb (110.2 kg)     Other studies reviewed: Additional studies/records reviewed today include: summarized above  ASSESSMENT AND PLAN:  1. PAF: Currently in sinus rhythm with mildly bradycardic heart rate.  Asymptomatic.  Continue Lopressor 50 mg twice daily.  I did discuss with him the option of decreasing dose given his mild bradycardia noted today however, given he is asymptomatic he prefers to keep this at 50 mg twice daily.  Continue Eliquis 5 mg twice daily.  No indication for cardioversion at this time.  2. CAD of native coronary artery without angina: No symptoms concerning for chest pain at this time.  Remains on aspirin given underlying CAD.  Continue aggressive risk factor modification and secondary prevention.  No plans for ischemic evaluation at this time.  3. Essential hypertension: Blood pressure is mildly elevated at 158/80 at triage with recheck of 142/84.  Patient will check blood pressure at home over this next week and call with readings.  If blood pressure remains elevated would likely titrate his lisinopril to 10 mg daily.  4. Hyperlipidemia: Recent LDL of 63 in 06/2017.  Continue Lipitor 80 mg daily.  5. Low back/right hip pain: Likely in the setting of recently initiated stationary bike riding.  Check labs as above.  Recommend patient follow-up with PCP and hold off on riding bike at this time.  Disposition: F/u with Dr. Saunders Revel in 3 months.   Current medicines are reviewed at length with the patient today.  The patient did not have any concerns regarding medicines.  Signed, Christell Faith, PA-C 08/08/2017 1:06 PM     [address] [place], [place] [address] [phone]

## 2017-08-08 ENCOUNTER — Encounter: Payer: Self-pay | Admitting: Physician Assistant

## 2017-08-08 ENCOUNTER — Ambulatory Visit: Payer: Medicare HMO | Admitting: Physician Assistant

## 2017-08-08 VITALS — BP 158/80 | HR 56 | Ht 76.0 in | Wt 252.0 lb

## 2017-08-08 DIAGNOSIS — E785 Hyperlipidemia, unspecified: Secondary | ICD-10-CM | POA: Diagnosis not present

## 2017-08-08 DIAGNOSIS — I4891 Unspecified atrial fibrillation: Secondary | ICD-10-CM | POA: Diagnosis not present

## 2017-08-08 DIAGNOSIS — I2510 Atherosclerotic heart disease of native coronary artery without angina pectoris: Secondary | ICD-10-CM

## 2017-08-08 DIAGNOSIS — I10 Essential (primary) hypertension: Secondary | ICD-10-CM | POA: Diagnosis not present

## 2017-08-08 DIAGNOSIS — I480 Paroxysmal atrial fibrillation: Secondary | ICD-10-CM | POA: Diagnosis not present

## 2017-08-08 DIAGNOSIS — M545 Low back pain: Secondary | ICD-10-CM | POA: Diagnosis not present

## 2017-08-08 NOTE — Patient Instructions (Signed)
Medication Instructions:  Your physician recommends that you continue on your current medications as directed. Please refer to the Current Medication list given to you today.   Labwork: Your physician recommends that you return for lab work in: TODAY (CBC, BMET).   Testing/Procedures: none  Follow-Up: Your physician recommends that you schedule a follow-up appointment in: 3 MONTHS WITH DR END.   If you need a refill on your cardiac medications before your next appointment, please call your pharmacy.

## 2017-08-09 LAB — CBC WITH DIFFERENTIAL/PLATELET
Basophils Absolute: 0 10*3/uL (ref 0.0–0.2)
Basos: 0 %
EOS (ABSOLUTE): 0.3 10*3/uL (ref 0.0–0.4)
Eos: 5 %
Hematocrit: 41 % (ref 37.5–51.0)
Hemoglobin: 13.9 g/dL (ref 13.0–17.7)
Immature Grans (Abs): 0 10*3/uL (ref 0.0–0.1)
Immature Granulocytes: 0 %
Lymphocytes Absolute: 2 10*3/uL (ref 0.7–3.1)
Lymphs: 31 %
MCH: 30.7 pg (ref 26.6–33.0)
MCHC: 33.9 g/dL (ref 31.5–35.7)
MCV: 91 fL (ref 79–97)
Monocytes Absolute: 0.5 10*3/uL (ref 0.1–0.9)
Monocytes: 8 %
Neutrophils Absolute: 3.6 10*3/uL (ref 1.4–7.0)
Neutrophils: 56 %
Platelets: 208 10*3/uL (ref 150–379)
RBC: 4.53 x10E6/uL (ref 4.14–5.80)
RDW: 13.3 % (ref 12.3–15.4)
WBC: 6.4 10*3/uL (ref 3.4–10.8)

## 2017-08-09 LAB — BASIC METABOLIC PANEL
BUN/Creatinine Ratio: 13 (ref 10–24)
BUN: 16 mg/dL (ref 8–27)
CO2: 23 mmol/L (ref 20–29)
Calcium: 9.5 mg/dL (ref 8.6–10.2)
Chloride: 100 mmol/L (ref 96–106)
Creatinine, Ser: 1.21 mg/dL (ref 0.76–1.27)
GFR calc Af Amer: 72 mL/min/{1.73_m2} (ref >59)
GFR calc non Af Amer: 62 mL/min/{1.73_m2} (ref >59)
Glucose: 197 mg/dL — ABNORMAL HIGH (ref 65–99)
Potassium: 4.6 mmol/L (ref 3.5–5.2)
Sodium: 140 mmol/L (ref 134–144)

## 2017-11-09 NOTE — Progress Notes (Signed)
Follow-up Outpatient Visit Date: 11/10/2017  Primary Care Provider: Patient, No Pcp Per No address on file  Chief Complaint: Follow-up CAD and PAF.  HPI:  [name] [name] is a 67 y.o. year-old male with history of CAD s/p CABG, a-fib, HTN, HLD, and DM2, who presents for follow-up of CAD and atrial fibrillation.  He was incidentally found to be in atrial fibrillation when I last saw him in January.  We agreed to increase metoprolol and switch clopidogrel to apixaban at that time.  He had converted back to sinus rhythm when he followed up with Christell Faith, PA, a month later.  Today, [name] [name] feels well.  He denies chest pain, palpitations, and edema.  He has stable exertional dyspnea when walking up hills.  He continues to exercise regularly on his treadmill and stationary bike without limitations.  He has not had any significant bleeding on apixaban and low-dose aspirin.  He has not used any sublingual nitroglycerin.  He notes occasional brief orthostatic lightheadedness without falls or syncope.  --------------------------------------------------------------------------------------------------  Past Medical/Surgical History: 1. HTN 2. Type II DM 3. Hyperlipidemia 4. CAD: Unstable angina 11/13 with LHC showing severe 3 vessel disease. He had CABG [name]) with LIMA-LAD, SVG-D2, seq SVG-PLV/PDA, SVG-OM. Echo (11/13) with EF 60%, mild LAE. Abnormal stress test in 7/15 with LHC showing occluded SVG-RCA, occluded SVG-D, occluded SVG-OM, and patent LIMA-LAD. Medical management.  5. Paroxysmal atrial fibrillation   Current Meds  Medication Sig  . acetaminophen (TYLENOL) 325 MG tablet Take 650 mg by mouth every 6 (six) hours as needed for headache.  Marland Kitchen apixaban (ELIQUIS) 5 MG TABS tablet Take 1 tablet (5 mg total) by mouth 2 (two) times daily.  Marland Kitchen aspirin EC 81 MG tablet Take 81 mg by mouth daily.  Marland Kitchen atorvastatin (LIPITOR) 80 MG tablet TAKE 1 TABLET BY MOUTH AT 6PM AS DIRECTED  . Coenzyme Q10  (CO Q 10) 100 MG CAPS Take 200 mg by mouth daily.  . fish oil-omega-3 fatty acids 1000 MG capsule Take 1 g by mouth daily.  . isosorbide mononitrate (IMDUR) 30 MG 24 hr tablet TAKE 1 TABLET BY MOUTH EVERY DAY  . lisinopril (PRINIVIL,ZESTRIL) 5 MG tablet TAKE 1 TABLET BY MOUTH EVERY DAY  . metoprolol tartrate (LOPRESSOR) 50 MG tablet Take 1 tablet (50 mg total) by mouth 2 (two) times daily.  . nitroGLYCERIN (NITROSTAT) 0.4 MG SL tablet Place 1 tablet (0.4 mg total) under the tongue every 5 (five) minutes as needed for chest pain.    Allergies: Penicillins  Social History   Tobacco Use  . Smoking status: Never Smoker  . Smokeless tobacco: Never Used  Substance Use Topics  . Alcohol use: Yes    Comment: maybe 1 beer q 2 weeks if that  . Drug use: No    Family History  Problem Relation Age of Onset  . CAD Mother   . Heart attack Mother   . CAD Father   . Diabetes Father   . Heart attack Father     Review of Systems: A 12-system review of systems was performed and was negative except as noted in the HPI.  --------------------------------------------------------------------------------------------------  Physical Exam: BP 120/72 (BP Location: Left Arm, Patient Position: Sitting, Cuff Size: Normal)   Pulse (!) 53   Ht 6\' 4"  (1.93 m)   Wt 248 lb (112.5 kg)   BMI 30.19 kg/m   General: NAD.  Accompanied by his wife. HEENT: No conjunctival pallor or scleral icterus. Moist mucous membranes.  OP clear. Neck: Supple without lymphadenopathy, thyromegaly, JVD, or HJR. No carotid bruit. Lungs: Normal work of breathing. Clear to auscultation bilaterally without wheezes or crackles. Heart: Bradycardic but regular without murmurs, rubs, or gallops. Non-displaced PMI. Abd: Bowel sounds present. Soft, NT/ND without hepatosplenomegaly Ext: No lower extremity edema. Prominent varicose veins in right calf. Skin: Warm and dry without rash.  EKG:  Sinus bradycardia (heart rate 53 bpm) with  left axis deviation, incomplete RBBB, and LVH with abnormal repolarization.  Lab Results  Component Value Date   WBC 6.4 08/08/2017   HGB 13.9 08/08/2017   HCT 41.0 08/08/2017   MCV 91 08/08/2017   PLT 208 08/08/2017    Lab Results  Component Value Date   NA 140 08/08/2017   K 4.6 08/08/2017   CL 100 08/08/2017   CO2 23 08/08/2017   BUN 16 08/08/2017   CREATININE 1.21 08/08/2017   GLUCOSE 197 (H) 08/08/2017   ALT 30 07/06/2017    Lab Results  Component Value Date   CHOL 121 07/06/2017   HDL 37 (L) 07/06/2017   LDLCALC 63 07/06/2017   TRIG 104 07/06/2017   CHOLHDL 3.3 07/06/2017    --------------------------------------------------------------------------------------------------  ASSESSMENT AND PLAN: Coronary artery disease with stable angina No recurrent chest pain.  Stable exertional dyspnea.  Continue antianginal regimen of metoprolol tartrate 50 mg twice daily and isosorbide mononitrate 30 mg daily.  Continue indefinite low-dose aspirin.  Paroxysmal atrial fibrillation Patient is in sinus rhythm today and has not had any symptoms to suggest recurrence.  We will plan to continue indefinite anticoagulation with apixaban, given CHADSVASC score of at least 4.  Modest bradycardia noted today.  Given lack of symptoms, we will continue with metoprolol tartrate 50 mg twice daily.  Hyperlipidemia LDL at goal on last check in 07/2017.  Continue atorvastatin, fish oil, and coenzyme Q10.  Hypertension Blood pressure well controlled today.  No medication changes at this time.  Follow-up: Return to clinic in 6 months.  Nelva Bush, MD 11/10/2017 8:41 AM

## 2017-11-10 ENCOUNTER — Ambulatory Visit: Payer: Medicare HMO | Admitting: Internal Medicine

## 2017-11-10 ENCOUNTER — Encounter: Payer: Self-pay | Admitting: Internal Medicine

## 2017-11-10 VITALS — BP 120/72 | HR 53 | Ht 76.0 in | Wt 248.0 lb

## 2017-11-10 DIAGNOSIS — I10 Essential (primary) hypertension: Secondary | ICD-10-CM | POA: Diagnosis not present

## 2017-11-10 DIAGNOSIS — I25708 Atherosclerosis of coronary artery bypass graft(s), unspecified, with other forms of angina pectoris: Secondary | ICD-10-CM

## 2017-11-10 DIAGNOSIS — E785 Hyperlipidemia, unspecified: Secondary | ICD-10-CM

## 2017-11-10 DIAGNOSIS — I480 Paroxysmal atrial fibrillation: Secondary | ICD-10-CM

## 2017-11-10 MED ORDER — NITROGLYCERIN 0.4 MG SL SUBL: 0.4000 mg | SUBLINGUAL_TABLET | SUBLINGUAL | 3 refills | Status: DC | PRN

## 2017-11-10 NOTE — Patient Instructions (Signed)

## 2017-11-23 ENCOUNTER — Other Ambulatory Visit: Payer: Self-pay | Admitting: *Deleted

## 2017-11-23 ENCOUNTER — Telehealth: Payer: Self-pay | Admitting: Internal Medicine

## 2017-11-23 DIAGNOSIS — I10 Essential (primary) hypertension: Secondary | ICD-10-CM

## 2017-11-23 DIAGNOSIS — I255 Ischemic cardiomyopathy: Secondary | ICD-10-CM

## 2017-11-23 MED ORDER — ISOSORBIDE MONONITRATE ER 30 MG PO TB24
30.0000 mg | ORAL_TABLET | Freq: Every day | ORAL | 3 refills | Status: DC
Start: 2017-11-23 — End: 2018-05-17

## 2017-11-23 NOTE — Telephone Encounter (Signed)
Requested Prescriptions   Signed Prescriptions Disp Refills  . isosorbide mononitrate (IMDUR) 30 MG 24 hr tablet 90 tablet 3    Sig: Take 1 tablet (30 mg total) by mouth daily.    Authorizing Provider: END, CHRISTOPHER    Ordering User: Britt Bottom

## 2017-11-23 NOTE — Telephone Encounter (Signed)
°*  STAT* If patient is at the pharmacy, call can be transferred to refill team.   1. Which medications need to be refilled? (please list name of each medication and dose if known) Isosorbide   2. Which pharmacy/location (including street and city if local pharmacy) is medication to be sent to? [facility] [place]   3. Do they need a 30 day or 90 day supply? 90 day

## 2017-11-28 ENCOUNTER — Other Ambulatory Visit: Payer: Self-pay

## 2017-11-28 ENCOUNTER — Telehealth: Payer: Self-pay | Admitting: Internal Medicine

## 2017-11-28 DIAGNOSIS — I255 Ischemic cardiomyopathy: Secondary | ICD-10-CM

## 2017-11-28 DIAGNOSIS — I10 Essential (primary) hypertension: Secondary | ICD-10-CM

## 2017-11-28 MED ORDER — ATORVASTATIN CALCIUM 80 MG PO TABS: ORAL_TABLET | ORAL | 1 refills | Status: DC

## 2017-11-28 MED ORDER — LISINOPRIL 5 MG PO TABS: 5.0000 mg | ORAL_TABLET | Freq: Every day | ORAL | 1 refills | Status: DC

## 2017-11-28 NOTE — Telephone Encounter (Signed)
°*  STAT* If patient is at the pharmacy, call can be transferred to refill team.   1. Which medications need to be refilled? (please list name of each medication and dose if known)  Atorvastatin (LIPITOR) 80 MG - 1 tablet at 6pm Lisinopril (PRINVIL, ZESTRIL) 5 MG - 1 tablet daily   2. Which pharmacy/location (including street and city if local pharmacy) is medication to be sent to? [facility] [place]  3. Do they need a 30 day or 90 day supply? 90 day

## 2017-11-28 NOTE — Telephone Encounter (Signed)
90 Day refills have been sent to [facility] in [place].

## 2018-02-21 DIAGNOSIS — R69 Illness, unspecified: Secondary | ICD-10-CM | POA: Diagnosis not present

## 2018-05-17 ENCOUNTER — Ambulatory Visit: Payer: Medicare HMO | Admitting: Internal Medicine

## 2018-05-17 ENCOUNTER — Encounter: Payer: Self-pay | Admitting: Internal Medicine

## 2018-05-17 VITALS — BP 120/60 | HR 55 | Ht 76.0 in | Wt 244.0 lb

## 2018-05-17 DIAGNOSIS — I25708 Atherosclerosis of coronary artery bypass graft(s), unspecified, with other forms of angina pectoris: Secondary | ICD-10-CM | POA: Diagnosis not present

## 2018-05-17 DIAGNOSIS — I480 Paroxysmal atrial fibrillation: Secondary | ICD-10-CM

## 2018-05-17 DIAGNOSIS — E785 Hyperlipidemia, unspecified: Secondary | ICD-10-CM

## 2018-05-17 DIAGNOSIS — I10 Essential (primary) hypertension: Secondary | ICD-10-CM | POA: Diagnosis not present

## 2018-05-17 DIAGNOSIS — R1030 Lower abdominal pain, unspecified: Secondary | ICD-10-CM | POA: Diagnosis not present

## 2018-05-17 MED ORDER — APIXABAN 5 MG PO TABS: 5.0000 mg | ORAL_TABLET | Freq: Two times a day (BID) | ORAL | 3 refills | Status: DC

## 2018-05-17 MED ORDER — LISINOPRIL 5 MG PO TABS: 5.0000 mg | ORAL_TABLET | Freq: Every day | ORAL | 3 refills | Status: DC

## 2018-05-17 MED ORDER — METOPROLOL TARTRATE 50 MG PO TABS: 50.0000 mg | ORAL_TABLET | Freq: Two times a day (BID) | ORAL | 3 refills | Status: DC

## 2018-05-17 MED ORDER — ATORVASTATIN CALCIUM 80 MG PO TABS: ORAL_TABLET | ORAL | 3 refills | Status: DC

## 2018-05-17 MED ORDER — ISOSORBIDE MONONITRATE ER 30 MG PO TB24: 30.0000 mg | ORAL_TABLET | Freq: Every day | ORAL | 3 refills | Status: DC

## 2018-05-17 NOTE — Progress Notes (Signed)
Follow-up Outpatient Visit Date: 05/17/2018  Primary Care Provider: Patient, No Pcp Per No address on file  Chief Complaint: Dyspnea on exertion and abdominal pain  HPI:  [name] [name] is a 67 y.o. year-old male with history of coronary artery disease status post CABG, paroxysmal atrial fibrillation, hypertension, hyperlipidemia, and type 2 diabetes mellitus, who presents for follow-up of coronary artery disease and atrial fibrillation.  I last saw [name] [name] in May, at which time he was doing well.  He was tolerating anticoagulation with apixaban and low-dose aspirin well.  His only complaint was of occasional brief orthostatic lightheadedness.  We did not make any medication changes at that time.  Today, [name] [name] reports that he has been doing well from heart standpoint, denying chest pain, palpitations, lightheadedness, and edema.  Mild exertional dyspnea is less pronounced than at our last visit.  He denies orthopnea and PND.  He notes chronic intermittent abdominal discomfort associated with constipation.  He is scheduled to see a new PCP later this week and will discuss these concerns with him.  He remains compliant with his medications and is tolerating apixaban and aspirin well without bleeding.  --------------------------------------------------------------------------------------------------  Past Medical/Surgical History: 1. HTN 2. Type II DM 3. Hyperlipidemia 4. CAD: Unstable angina 11/13 with LHC showing severe 3 vessel disease. He had CABG [name]) with LIMA-LAD, SVG-D2, seq SVG-PLV/PDA, SVG-OM. Echo (11/13) with EF 60%, mild LAE. Abnormal stress test in 7/15 with LHC showing occluded SVG-RCA, occluded SVG-D, occluded SVG-OM, and patent LIMA-LAD. Medical management.  5. Paroxysmal atrial fibrillation  Recent CV Pertinent Labs: Lab Results  Component Value Date   CHOL 121 07/06/2017   HDL 37 (L) 07/06/2017   LDLCALC 63 07/06/2017   TRIG 104 07/06/2017   CHOLHDL 3.3  07/06/2017   CHOLHDL 3.0 06/02/2016   INR 1.10 12/18/2013   K 4.6 08/08/2017   MG 2.0 07/06/2017   BUN 16 08/08/2017   CREATININE 1.21 08/08/2017   CREATININE 1.09 06/02/2016    Current Meds  Medication Sig  . acetaminophen (TYLENOL) 325 MG tablet Take 650 mg by mouth every 6 (six) hours as needed for headache.  Marland Kitchen apixaban (ELIQUIS) 5 MG TABS tablet Take 1 tablet (5 mg total) by mouth 2 (two) times daily.  Marland Kitchen aspirin EC 81 MG tablet Take 81 mg by mouth daily.  Marland Kitchen atorvastatin (LIPITOR) 80 MG tablet TAKE 1 TABLET BY MOUTH AT 6PM AS DIRECTED  . Coenzyme Q10 (CO Q 10) 100 MG CAPS Take 200 mg by mouth daily.  . fish oil-omega-3 fatty acids 1000 MG capsule Take 1 g by mouth daily.  . isosorbide mononitrate (IMDUR) 30 MG 24 hr tablet Take 1 tablet (30 mg total) by mouth daily.  Marland Kitchen lisinopril (PRINIVIL,ZESTRIL) 5 MG tablet Take 1 tablet (5 mg total) by mouth daily.  . metoprolol tartrate (LOPRESSOR) 50 MG tablet Take 1 tablet (50 mg total) by mouth 2 (two) times daily.  . nitroGLYCERIN (NITROSTAT) 0.4 MG SL tablet Place 1 tablet (0.4 mg total) under the tongue every 5 (five) minutes as needed for chest pain.    Allergies: Penicillins  Social History   Tobacco Use  . Smoking status: Never Smoker  . Smokeless tobacco: Never Used  Substance Use Topics  . Alcohol use: Yes    Comment: maybe 1 beer q 2 weeks if that  . Drug use: No    Family History  Problem Relation Age of Onset  . CAD Mother   . Heart attack Mother   .  CAD Father   . Diabetes Father   . Heart attack Father     Review of Systems: A 12-system review of systems was performed and was negative except as noted in the HPI.  --------------------------------------------------------------------------------------------------  Physical Exam: BP 140/74 (BP Location: Left Arm, Patient Position: Sitting, Cuff Size: Normal)   Pulse (!) 55   Ht 6\' 4"  (1.93 m)   Wt 244 lb (110.7 kg)   BMI 29.70 kg/m   General:   NAD. HEENT: No conjunctival pallor or scleral icterus. Moist mucous membranes.  OP clear. Neck: Supple without lymphadenopathy, thyromegaly, JVD, or HJR. Lungs: Normal work of breathing. Clear to auscultation bilaterally without wheezes or crackles. Heart: Regular rate and rhythm without murmurs, rubs, or gallops. Non-displaced PMI. Abd: Bowel sounds present. Soft, NT/ND without hepatosplenomegaly Ext: No lower extremity edema. Varicose veins. Skin: Warm and dry without rash.  EKG:  Sinus bradycardia with LAD and LVH with abnormal repolarization.  Lab Results  Component Value Date   WBC 6.4 08/08/2017   HGB 13.9 08/08/2017   HCT 41.0 08/08/2017   MCV 91 08/08/2017   PLT 208 08/08/2017    Lab Results  Component Value Date   NA 140 08/08/2017   K 4.6 08/08/2017   CL 100 08/08/2017   CO2 23 08/08/2017   BUN 16 08/08/2017   CREATININE 1.21 08/08/2017   GLUCOSE 197 (H) 08/08/2017   ALT 30 07/06/2017    Lab Results  Component Value Date   CHOL 121 07/06/2017   HDL 37 (L) 07/06/2017   LDLCALC 63 07/06/2017   TRIG 104 07/06/2017   CHOLHDL 3.3 07/06/2017    --------------------------------------------------------------------------------------------------  ASSESSMENT AND PLAN: CAD with stable angina No chest pain; DOE stable to improved.  Continue current medications for secondary prevention, as well as antianginal therapy with metoprolol tartrate and isosorbide mononitrate.  Paroxysmal atrial fibrillation Maintaining sinus rhythm.  Continue anticoagulation with apixaban.  HTN BP mildly elevated today but typically better at home.  Continue current medications and focus on limiting sodium intake.  Hyperlipidemia LDL at goal on last check in 06/2017.  Continue high-intensity statin therapy.  I will defer checking lipids to his PCP, whom [name] [name] will be meeting later this week.  Abdominal pain Most likely related to constipation.  I will defer further evaluation and  treatment to his PCP.  Follow-up: Return to clinic in 1 year.  Nelva Bush, MD 05/17/2018 8:38 AM

## 2018-05-17 NOTE — Patient Instructions (Signed)
Medication Instructions:  Your physician recommends that you continue on your current medications as directed. Please refer to the Current Medication list given to you today.  If you need a refill on your cardiac medications before your next appointment, please call your pharmacy.   Lab work: none If you have labs (blood work) drawn today and your tests are completely normal, you will receive your results only by: . MyChart Message (if you have MyChart) OR . A paper copy in the mail If you have any lab test that is abnormal or we need to change your treatment, we will call you to review the results.  Testing/Procedures: none  Follow-Up: At [facility], you and your health needs are our priority.  As part of our continuing mission to provide you with exceptional heart care, we have created designated Provider Care Teams.  These Care Teams include your primary Cardiologist (physician) and Advanced Practice Providers (APPs -  Physician Assistants and Nurse Practitioners) who all work together to provide you with the care you need, when you need it. You will need a follow up appointment in 12 months.  Please call our office 2 months in advance to schedule this appointment.  You may see DR CHRISTOPHER END or one of the following Advanced Practice Providers on your designated Care Team:   Christopher Berge, NP Ryan Dunn, PA-C . Jacquelyn Visser, PA-C    

## 2018-05-22 ENCOUNTER — Other Ambulatory Visit: Payer: Self-pay | Admitting: Family Medicine

## 2018-05-22 ENCOUNTER — Encounter: Payer: Self-pay | Admitting: Family Medicine

## 2018-05-22 ENCOUNTER — Ambulatory Visit (INDEPENDENT_AMBULATORY_CARE_PROVIDER_SITE_OTHER): Payer: Medicare HMO | Admitting: Family Medicine

## 2018-05-22 VITALS — BP 134/74 | HR 60 | Temp 98.1°F | Resp 16 | Ht 76.0 in | Wt 248.0 lb

## 2018-05-22 DIAGNOSIS — R7309 Other abnormal glucose: Secondary | ICD-10-CM | POA: Diagnosis not present

## 2018-05-22 DIAGNOSIS — Z7689 Persons encountering health services in other specified circumstances: Secondary | ICD-10-CM

## 2018-05-22 DIAGNOSIS — I25708 Atherosclerosis of coronary artery bypass graft(s), unspecified, with other forms of angina pectoris: Secondary | ICD-10-CM

## 2018-05-22 DIAGNOSIS — I10 Essential (primary) hypertension: Secondary | ICD-10-CM

## 2018-05-22 DIAGNOSIS — E785 Hyperlipidemia, unspecified: Secondary | ICD-10-CM | POA: Diagnosis not present

## 2018-05-22 DIAGNOSIS — I480 Paroxysmal atrial fibrillation: Secondary | ICD-10-CM | POA: Diagnosis not present

## 2018-05-22 DIAGNOSIS — Z0000 Encounter for general adult medical examination without abnormal findings: Secondary | ICD-10-CM

## 2018-05-22 DIAGNOSIS — Z1159 Encounter for screening for other viral diseases: Secondary | ICD-10-CM

## 2018-05-22 DIAGNOSIS — Z125 Encounter for screening for malignant neoplasm of prostate: Secondary | ICD-10-CM

## 2018-05-22 DIAGNOSIS — E1121 Type 2 diabetes mellitus with diabetic nephropathy: Secondary | ICD-10-CM | POA: Insufficient documentation

## 2018-05-22 NOTE — Patient Instructions (Addendum)
Thank you for coming to the office today.  Due for 1st dose Pneumonia vaccine (Prevnar-13) in January at next visit  We will check status of last Colonoscopy - if we can get record, then maybe good for 1-2 years, otherwise we can re order this.  We will check prostate and Hep C as well with blood test  Also checking A1c sugar.  DUE for FASTING BLOOD WORK (no food or drink after midnight before the lab appointment, only water or coffee without cream/sugar on the morning of)  SCHEDULE "Lab Only" visit in the morning at the clinic for lab draw in [place]   - Make sure Lab Only appointment is at about 1 week before your next appointment, so that results will be available  For Lab Results, once available within 2-3 days of blood draw, you can can log in to MyChart online to view your results and a brief explanation. Also, we can discuss results at next follow-up visit.   Please schedule a Follow-up Appointment to: Return in about 6 weeks (around 07/03/2018) for Annual Physical.  If you have any other questions or concerns, please feel free to call the office or send a message through [place]. You may also schedule an earlier appointment if necessary.  Additionally, you may be receiving a survey about your experience at our office within a few days to 1 week by e-mail or mail. We value your feedback.  Nobie Putnam, DO [place]

## 2018-05-22 NOTE — Progress Notes (Signed)
Subjective:    Patient ID: [name] [name], male    DOB: Sep 10, 1950, 67 y.o.   MRN: [id number]  [name] [name] is a 67 y.o. male presenting on 05/22/2018 for Establish Care   HPI   CAD s/p CABG 2013 LHC with severe 3 vessel disease / PAF History of hospitalization from yellow jacket stings and ultimately unstable angina led to [facility] and diagnosis, no prior issue of heart or heart damage. Followed by Cardiology since that time, has been on med management, and doing well. He has transferred care to Dr End since moving towards this area instead of [place] 5mg  BID, high intensity statin, ASA, omega 3 fish oil, imdur, ACEi, BB - has NTG SL has never used it  HYPERLIPIDEMIA: - Reports no concerns. Last lipid panel 06/2017, mostly controlled  - Currently taking Atorvastatin 80mg  daily, tolerating well without side effects or myalgias  Pre-Diabetes Elevated glucose in past, last A1c 6.1 04/2012, has not had A1c checked since. Admits not always following low carb or low sugar diet.  CHRONIC HTN: Reports no new concerns. Admits some mild elevated BP at doctors office. Usually improves, rarely checking BP currently Current Meds - Metoprolol 50mg  BID, Lisiniopril 5mg  daily, Isosorbide mononite   Reports good compliance, took meds today. Tolerating well, w/o complaints. Denies CP, dyspnea, HA, edema, dizziness / lightheadedness   Health Maintenance:  Already UTD Flu vaccine  Never had PNA vaccine, now age >64. Declines today, will reconsider.  Colon CA Screening: Last Colonoscopy  (done by likely [facility] - approx 2010-2011), results without any polyp, good for 10 years. Currently asymptomatic. No known family history of colon CA. Due for screening test in 1-2 years - will request record first, if cannot obtain will refer in future.  Prostate CA Screening: No prior prostate CA screening. No prior DRE other than colonoscopy. Last PSA 1.22 (2007). Currently  asymptomatic except mild LUTS. No known family history of prostate CA. Due for screening.  Depression screen [facility] 2/9 05/22/2018 06/19/2012 06/19/2012  Decreased Interest 0 0 0  Down, Depressed, Hopeless 0 0 0  PHQ - 2 Score 0 0 0    Past Medical History:  Diagnosis Date  . Allergy   . Coronary artery disease 2013   a. [place] 2013 w/ severe 3-V CAD; b. s/p 4-V CABG in 2013 (LIMA-LAD, VG-D2, seq VG-PLA/PDA, VG-OM; c. LHC 2015 patent LIMA-LAD, occluded VGs, med Rx  . Headache(784.0)   . PAF (paroxysmal atrial fibrillation) (HCC)    a. noted post-operatively following CABG in 2013; b. recurrence 06/2017; c. CHADS2VASc => 4 (HTN, age x 1, DM, vascular disease); d. On Eliquis   Past Surgical History:  Procedure Laterality Date  . CARDIAC CATHETERIZATION  12/18/13   Occluded SVGs  . CORONARY ARTERY BYPASS GRAFT  04/28/2012   Procedure: CORONARY ARTERY BYPASS GRAFTING (CABG);  Surgeon: Ivin Poot, MD;  Location: [place];  Service: Open Heart Surgery;  Laterality: N/A;  Right saphenous vein used for bypass grafting. Five  bypasses performed including left mammary artery.   . INGUINAL HERNIA REPAIR     right   . LEFT HEART CATHETERIZATION WITH CORONARY ANGIOGRAM N/A 04/27/2012   Procedure: LEFT HEART CATHETERIZATION WITH CORONARY ANGIOGRAM;  Surgeon: Larey Dresser, MD;  [facility] C[facility];  Service: Cardiovascular;  Laterality: N/A;  . LEFT HEART CATHETERIZATION WITH CORONARY/GRAFT ANGIOGRAM N/A 12/18/2013   Procedure: LEFT HEART CATHETERIZATION WITH Beatrix Fetters;  Surgeon: Belva Crome III,  MD;  Location: [place] CATH LAB;  Service: Cardiovascular;  Laterality: N/A;   Social History   Socioeconomic History  . Marital status: Married    Spouse name: Not on file  . Number of children: Not on file  . Years of education: Not on file  . Highest education level: Not on file  Occupational History  . Not on file  Social Needs  . Financial resource strain: Not on file  . Food insecurity:      Worry: Not on file    Inability: Not on file  . Transportation needs:    Medical: Not on file    Non-medical: Not on file  Tobacco Use  . Smoking status: Never Smoker  . Smokeless tobacco: Never Used  Substance and Sexual Activity  . Alcohol use: Yes    Comment: maybe 1 beer q 2 weeks if that  . Drug use: No  . Sexual activity: Yes  Lifestyle  . Physical activity:    Days per week: Not on file    Minutes per session: Not on file  . Stress: Not on file  Relationships  . Social connections:    Talks on phone: Not on file    Gets together: Not on file    Attends religious service: Not on file    Active member of club or organization: Not on file    Attends meetings of clubs or organizations: Not on file    Relationship status: Not on file  . Intimate partner violence:    Fear of current or ex partner: Not on file    Emotionally abused: Not on file    Physically abused: Not on file    Forced sexual activity: Not on file  Other Topics Concern  . Not on file  Social History Narrative  . Not on file   Family History  Problem Relation Age of Onset  . CAD Mother   . Heart attack Mother   . CAD Father   . Diabetes Father   . Heart attack Father   . Colon cancer Neg Hx   . Prostate cancer Neg Hx    Current Outpatient Medications on File Prior to Visit  Medication Sig  . acetaminophen (TYLENOL) 325 MG tablet Take 650 mg by mouth every 6 (six) hours as needed for headache.  Marland Kitchen apixaban (ELIQUIS) 5 MG TABS tablet Take 1 tablet (5 mg total) by mouth 2 (two) times daily.  Marland Kitchen aspirin EC 81 MG tablet Take 81 mg by mouth daily.  Marland Kitchen atorvastatin (LIPITOR) 80 MG tablet TAKE 1 TABLET BY MOUTH AT 6PM AS DIRECTED  . Coenzyme Q10 (CO Q 10) 100 MG CAPS Take 200 mg by mouth daily.  . fish oil-omega-3 fatty acids 1000 MG capsule Take 1 g by mouth daily.  . isosorbide mononitrate (IMDUR) 30 MG 24 hr tablet Take 1 tablet (30 mg total) by mouth daily.  Marland Kitchen lisinopril (PRINIVIL,ZESTRIL) 5 MG  tablet Take 1 tablet (5 mg total) by mouth daily.  . metoprolol tartrate (LOPRESSOR) 50 MG tablet Take 1 tablet (50 mg total) by mouth 2 (two) times daily.  . nitroGLYCERIN (NITROSTAT) 0.4 MG SL tablet Place 1 tablet (0.4 mg total) under the tongue every 5 (five) minutes as needed for chest pain.   No current facility-administered medications on file prior to visit.     Review of Systems Per HPI unless specifically indicated above    Objective:    BP 134/74 (BP Location: Left Arm, Cuff Size:  Normal)   Pulse 60   Temp 98.1 F (36.7 C) (Oral)   Resp 16   Ht 6\' 4"  (1.93 m)   Wt 248 lb (112.5 kg)   BMI 30.19 kg/m   Wt Readings from Last 3 Encounters:  05/22/18 248 lb (112.5 kg)  05/17/18 244 lb (110.7 kg)  11/10/17 248 lb (112.5 kg)    Physical Exam  Constitutional: He is oriented to person, place, and time. He appears well-developed and well-nourished. No distress.  Well-appearing, comfortable, cooperative  HENT:  Head: Normocephalic and atraumatic.  Mouth/Throat: Oropharynx is clear and moist.  Eyes: Conjunctivae are normal. Right eye exhibits no discharge. Left eye exhibits no discharge.  Cardiovascular: Normal rate, regular rhythm, normal heart sounds and intact distal pulses.  No murmur heard. Pulmonary/Chest: Effort normal.  Musculoskeletal: He exhibits no edema.  Neurological: He is alert and oriented to person, place, and time.  Skin: Skin is warm and dry. No rash noted. He is not diaphoretic. No erythema.  Psychiatric: He has a normal mood and affect. His behavior is normal.  Well groomed, good eye contact, normal speech and thoughts  Nursing note and vitals reviewed.  Results for orders placed or performed in visit on 16/10/96  Basic metabolic panel  Result Value Ref Range   Glucose 197 (H) 65 - 99 mg/dL   BUN 16 8 - 27 mg/dL   Creatinine, Ser 1.21 0.76 - 1.27 mg/dL   GFR calc non Af Amer 62 >59 mL/min/1.73   GFR calc Af Amer 72 >59 mL/min/1.73    BUN/Creatinine Ratio 13 10 - 24   Sodium 140 134 - 144 mmol/L   Potassium 4.6 3.5 - 5.2 mmol/L   Chloride 100 96 - 106 mmol/L   CO2 23 20 - 29 mmol/L   Calcium 9.5 8.6 - 10.2 mg/dL  CBC with Differential/Platelet  Result Value Ref Range   WBC 6.4 3.4 - 10.8 x10E3/uL   RBC 4.53 4.14 - 5.80 x10E6/uL   Hemoglobin 13.9 13.0 - 17.7 g/dL   Hematocrit 41.0 37.5 - 51.0 %   MCV 91 79 - 97 fL   MCH 30.7 26.6 - 33.0 pg   MCHC 33.9 31.5 - 35.7 g/dL   RDW 13.3 12.3 - 15.4 %   Platelets 208 150 - 379 x10E3/uL   Neutrophils 56 Not Estab. %   Lymphs 31 Not Estab. %   Monocytes 8 Not Estab. %   Eos 5 Not Estab. %   Basos 0 Not Estab. %   Neutrophils Absolute 3.6 1.4 - 7.0 x10E3/uL   Lymphocytes Absolute 2.0 0.7 - 3.1 x10E3/uL   Monocytes Absolute 0.5 0.1 - 0.9 x10E3/uL   EOS (ABSOLUTE) 0.3 0.0 - 0.4 x10E3/uL   Basophils Absolute 0.0 0.0 - 0.2 x10E3/uL   Immature Granulocytes 0 Not Estab. %   Immature Grans (Abs) 0.0 0.0 - 0.1 x10E3/uL      Assessment & Plan:   Problem List Items Addressed This Visit    Abnormal glucose    Pre-Diabetes vs Elevated A1c Due for repeat A1c check, prior elevated glucose At risk of DM Will check with upcoming labs  Education on lifestyle diet, low carb - handout given      Coronary artery disease of bypass graft of native heart with stable angina pectoris (HCC) - Primary (Chronic)    Stable, without anginal symptoms Controlled on Imdur and med management Followed by Dr Saunders [facility] Cardiology      Dyslipidemia (Chronic)    Previously  controlled cholesterol on statin  Last lipid panel 06/2017 Known CAD CABG  Plan: 1. Continue current meds - Atorvastatin 80mg  daily, Fish Oil omega 3 2. Continue ASA 81mg  for secondary ASCVD risk reduction 3. Encourage improved lifestyle - low carb/cholesterol, reduce portion size, continue improving regular exercise 4. Follow-up 6 wk labs       Essential hypertension (Chronic)    Mildly elevated initial BP, repeat  manual check improved to normal. - Home BP readings not cheecking  Complicated by CAD   Plan:  1. Continue current BP regimen - Metoprolol 50mg  BID, Lisiniopril 5mg  daily, Isosorbide mononite 2. Encourage improved lifestyle - low sodium diet, regular exercise 3. Continue monitor BP outside office, bring readings to next visit, if persistently >140/90 or new symptoms notify office sooner 4. Follow-up 6 wks labs / annual       Paroxysmal atrial fibrillation ([place])    Stable without evidence of AFib on exam On anticoagulation Followed by Cardiology       Other Visit Diagnoses    Encounter to establish care with new doctor        Review outside records and specialist documentation Request colonoscopy   No orders of the defined types were placed in this encounter.   Follow up plan: Return in about 6 weeks (around 07/03/2018) for Annual Physical.   Future labs ordered for 06/27/18  Nobie Putnam, [facility] 05/22/2018, 3:34 PM

## 2018-05-23 NOTE — Assessment & Plan Note (Signed)
Stable without evidence of AFib on exam On anticoagulation Followed by Cardiology

## 2018-05-23 NOTE — Assessment & Plan Note (Signed)
Pre-Diabetes vs Elevated A1c Due for repeat A1c check, prior elevated glucose At risk of DM Will check with upcoming labs  Education on lifestyle diet, low carb - handout given

## 2018-05-23 NOTE — Assessment & Plan Note (Signed)
Mildly elevated initial BP, repeat manual check improved to normal. - Home BP readings not cheecking  Complicated by CAD   Plan:  1. Continue current BP regimen - Metoprolol 50mg  BID, Lisiniopril 5mg  daily, Isosorbide mononite 2. Encourage improved lifestyle - low sodium diet, regular exercise 3. Continue monitor BP outside office, bring readings to next visit, if persistently >140/90 or new symptoms notify office sooner 4. Follow-up 6 wks labs / annual

## 2018-05-23 NOTE — Assessment & Plan Note (Signed)
Stable, without anginal symptoms Controlled on Imdur and med management Followed by Dr End [facility] Cardiology 

## 2018-05-23 NOTE — Assessment & Plan Note (Signed)
Previously controlled cholesterol on statin  Last lipid panel 06/2017 Known CAD CABG  Plan: 1. Continue current meds - Atorvastatin 80mg  daily, Fish Oil omega 3 2. Continue ASA 81mg  for secondary ASCVD risk reduction 3. Encourage improved lifestyle - low carb/cholesterol, reduce portion size, continue improving regular exercise 4. Follow-up 6 wk labs

## 2018-05-30 ENCOUNTER — Encounter: Payer: Self-pay | Admitting: Family Medicine

## 2018-06-26 ENCOUNTER — Other Ambulatory Visit: Payer: Self-pay

## 2018-06-26 DIAGNOSIS — Z0000 Encounter for general adult medical examination without abnormal findings: Secondary | ICD-10-CM

## 2018-06-26 DIAGNOSIS — I480 Paroxysmal atrial fibrillation: Secondary | ICD-10-CM

## 2018-06-26 DIAGNOSIS — Z1159 Encounter for screening for other viral diseases: Secondary | ICD-10-CM

## 2018-06-26 DIAGNOSIS — I10 Essential (primary) hypertension: Secondary | ICD-10-CM

## 2018-06-26 DIAGNOSIS — R7309 Other abnormal glucose: Secondary | ICD-10-CM

## 2018-06-26 DIAGNOSIS — Z125 Encounter for screening for malignant neoplasm of prostate: Secondary | ICD-10-CM

## 2018-06-26 DIAGNOSIS — E785 Hyperlipidemia, unspecified: Secondary | ICD-10-CM

## 2018-06-27 ENCOUNTER — Other Ambulatory Visit: Payer: Medicare HMO

## 2018-06-27 DIAGNOSIS — R7309 Other abnormal glucose: Secondary | ICD-10-CM | POA: Diagnosis not present

## 2018-06-27 DIAGNOSIS — Z1159 Encounter for screening for other viral diseases: Secondary | ICD-10-CM | POA: Diagnosis not present

## 2018-06-27 DIAGNOSIS — Z125 Encounter for screening for malignant neoplasm of prostate: Secondary | ICD-10-CM | POA: Diagnosis not present

## 2018-06-27 DIAGNOSIS — Z0000 Encounter for general adult medical examination without abnormal findings: Secondary | ICD-10-CM | POA: Diagnosis not present

## 2018-06-27 DIAGNOSIS — I480 Paroxysmal atrial fibrillation: Secondary | ICD-10-CM | POA: Diagnosis not present

## 2018-06-27 DIAGNOSIS — E785 Hyperlipidemia, unspecified: Secondary | ICD-10-CM | POA: Diagnosis not present

## 2018-06-27 DIAGNOSIS — I10 Essential (primary) hypertension: Secondary | ICD-10-CM | POA: Diagnosis not present

## 2018-06-28 LAB — CBC WITH DIFFERENTIAL/PLATELET
Absolute Monocytes: 722 cells/uL (ref 200–950)
Basophils Absolute: 58 cells/uL (ref 0–200)
Basophils Relative: 0.7 %
Eosinophils Absolute: 208 cells/uL (ref 15–500)
Eosinophils Relative: 2.5 %
HCT: 43.8 % (ref 38.5–50.0)
Hemoglobin: 14.8 g/dL (ref 13.2–17.1)
Lymphs Abs: 2191 cells/uL (ref 850–3900)
MCH: 30.3 pg (ref 27.0–33.0)
MCHC: 33.8 g/dL (ref 32.0–36.0)
MCV: 89.6 fL (ref 80.0–100.0)
MPV: 12 fL (ref 7.5–12.5)
Monocytes Relative: 8.7 %
Neutro Abs: 5121 cells/uL (ref 1500–7800)
Neutrophils Relative %: 61.7 %
Platelets: 248 10*3/uL (ref 140–400)
RBC: 4.89 10*6/uL (ref 4.20–5.80)
RDW: 12.2 % (ref 11.0–15.0)
Total Lymphocyte: 26.4 %
WBC: 8.3 10*3/uL (ref 3.8–10.8)

## 2018-06-28 LAB — HEPATITIS C ANTIBODY
Hepatitis C Ab: NONREACTIVE
SIGNAL TO CUT-OFF: 0.12 (ref <1.00)

## 2018-06-28 LAB — COMPLETE METABOLIC PANEL WITH GFR
AG Ratio: 1.6 (calc) (ref 1.0–2.5)
ALT: 31 U/L (ref 9–46)
AST: 25 U/L (ref 10–35)
Albumin: 4.3 g/dL (ref 3.6–5.1)
Alkaline phosphatase (APISO): 92 U/L (ref 40–115)
BUN/Creatinine Ratio: 15 (calc) (ref 6–22)
BUN: 20 mg/dL (ref 7–25)
CO2: 28 mmol/L (ref 20–32)
Calcium: 9.9 mg/dL (ref 8.6–10.3)
Chloride: 98 mmol/L (ref 98–110)
Creat: 1.37 mg/dL — ABNORMAL HIGH (ref 0.70–1.25)
GFR, Est African American: 62 mL/min/{1.73_m2} (ref >=60)
GFR, Est Non African American: 53 mL/min/{1.73_m2} — ABNORMAL LOW (ref >=60)
Globulin: 2.7 g/dL (calc) (ref 1.9–3.7)
Glucose, Bld: 207 mg/dL — ABNORMAL HIGH (ref 65–99)
Potassium: 4.8 mmol/L (ref 3.5–5.3)
Sodium: 137 mmol/L (ref 135–146)
Total Bilirubin: 0.8 mg/dL (ref 0.2–1.2)
Total Protein: 7 g/dL (ref 6.1–8.1)

## 2018-06-28 LAB — HEMOGLOBIN A1C
Hgb A1c MFr Bld: 9.3 % of total Hgb — ABNORMAL HIGH (ref <5.7)
Mean Plasma Glucose: 220 (calc)
eAG (mmol/L): 12.2 (calc)

## 2018-06-28 LAB — PSA: PSA: 1.2 ng/mL (ref <=4.0)

## 2018-06-28 LAB — LIPID PANEL
Cholesterol: 109 mg/dL (ref <200)
HDL: 34 mg/dL — ABNORMAL LOW (ref >40)
LDL Cholesterol (Calc): 58 mg/dL (calc)
Non-HDL Cholesterol (Calc): 75 mg/dL (calc) (ref <130)
Total CHOL/HDL Ratio: 3.2 (calc) (ref <5.0)
Triglycerides: 89 mg/dL (ref <150)

## 2018-07-04 ENCOUNTER — Encounter: Payer: Self-pay | Admitting: Family Medicine

## 2018-07-04 ENCOUNTER — Ambulatory Visit (INDEPENDENT_AMBULATORY_CARE_PROVIDER_SITE_OTHER): Payer: Medicare HMO | Admitting: Family Medicine

## 2018-07-04 VITALS — BP 121/75 | HR 57 | Temp 97.7°F | Resp 16 | Ht 76.0 in | Wt 242.8 lb

## 2018-07-04 DIAGNOSIS — N183 Chronic kidney disease, stage 3 unspecified: Secondary | ICD-10-CM

## 2018-07-04 DIAGNOSIS — I10 Essential (primary) hypertension: Secondary | ICD-10-CM

## 2018-07-04 DIAGNOSIS — N182 Chronic kidney disease, stage 2 (mild): Secondary | ICD-10-CM | POA: Insufficient documentation

## 2018-07-04 DIAGNOSIS — E1121 Type 2 diabetes mellitus with diabetic nephropathy: Secondary | ICD-10-CM

## 2018-07-04 DIAGNOSIS — Z0000 Encounter for general adult medical examination without abnormal findings: Secondary | ICD-10-CM

## 2018-07-04 DIAGNOSIS — E785 Hyperlipidemia, unspecified: Secondary | ICD-10-CM | POA: Diagnosis not present

## 2018-07-04 DIAGNOSIS — E1169 Type 2 diabetes mellitus with other specified complication: Secondary | ICD-10-CM

## 2018-07-04 DIAGNOSIS — Z23 Encounter for immunization: Secondary | ICD-10-CM

## 2018-07-04 DIAGNOSIS — I480 Paroxysmal atrial fibrillation: Secondary | ICD-10-CM

## 2018-07-04 MED ORDER — METFORMIN HCL 500 MG PO TABS: 500.0000 mg | ORAL_TABLET | Freq: Two times a day (BID) | ORAL | 1 refills | Status: DC

## 2018-07-04 NOTE — Progress Notes (Signed)
Subjective:    Patient ID: [name] [name], male    DOB: [birth year], 68 y.o.   MRN: [id number]  [name] [name] is a 68 y.o. male presenting on 07/04/2018 for Annual Exam  He is accompanied by spouse, Izora Gala, for part of the visit and additional history.  HPI   Here for Annual Physical and Lab Review  (New diagnosis) Type 2 Diabetes, with Nephropathy CKD III Elevated glucose in past, last A1c 6.1 04/2012, has not had A1c checked since - now on recent lab significant elevated A1c 9.3, consistent with his prior sugar reading, with new diagnosis Type 2 Diabetes Meds: never on med Currently on ACEi Lifestyle: - Diet (improving diet, but not fully changed yet)  - Exercise (improved exercise, stationary bike 5 miles most days now) - Father with diabetes He does not have an eye doctor for DM eye exam Admits some polyuria  HYPERLIPIDEMIA: - Reports no concerns. Last lipid panel 06/2018, controlled  - Currently taking Atorvastatin 80mg  daily, tolerating well without side effects or myalgias  CHRONIC HTN: Reports no new concerns. Admits some mild elevated BP at doctors office. Usually improves, rarely checking BP currently Current Meds - Metoprolol 50mg  BID, Lisiniopril 5mg  daily, Isosorbide mononite   Reports good compliance, took meds today. Tolerating well, w/o complaints.  CAD s/p CABG 2013 LHC with severe 3 vessel disease / PAF History of hospitalization from yellow jacket stings and ultimately unstable angina led to [facility] and diagnosis, no prior issue of heart or heart damage. Followed by Cardiology since that time, has been on med management, and doing well - Taking Eliquis 5mg  BID, high intensity statin, ASA, omega 3 fish oil, imdur, ACEi, BB - has NTG SL has never used it  Health Maintenance: Due for initial pneumonia vaccine at age 64 also as diabetic - will receive Prevnar-13 today  Negative PSA for prostate cancer screening, asymptomatic  Negative Hep C  screening.  Depression screen [facility] 2/9 07/04/2018 05/22/2018 06/19/2012  Decreased Interest 0 0 0  Down, Depressed, Hopeless 0 0 0  PHQ - 2 Score 0 0 0    Social History   Tobacco Use  . Smoking status: Never Smoker  . Smokeless tobacco: Never Used  Substance Use Topics  . Alcohol use: Not Currently  . Drug use: No    Review of Systems  Constitutional: Negative for activity change, appetite change, chills, diaphoresis, fatigue and fever.  HENT: Negative for congestion and hearing loss.   Eyes: Negative for visual disturbance.  Respiratory: Negative for apnea, cough, choking, chest tightness, shortness of breath and wheezing.   Cardiovascular: Negative for chest pain, palpitations and leg swelling.  Gastrointestinal: Negative for abdominal pain, anal bleeding, blood in stool, constipation, diarrhea, nausea and vomiting.  Endocrine: Positive for polyuria.  Genitourinary: Negative for decreased urine volume, difficulty urinating, dysuria, frequency, hematuria and urgency.  Musculoskeletal: Negative for arthralgias, back pain and neck pain.  Skin: Negative for rash.  Allergic/Immunologic: Negative for environmental allergies.  Neurological: Negative for dizziness, weakness, light-headedness, numbness and headaches.  Hematological: Negative for adenopathy.  Psychiatric/Behavioral: Negative for behavioral problems, dysphoric mood and sleep disturbance. The patient is not nervous/anxious.    Per HPI unless specifically indicated above     Objective:    BP 121/75   Pulse (!) 57   Temp 97.7 F (36.5 C) (Oral)   Resp 16   Ht 6\' 4"  (1.93 m)   Wt 242 lb 12.8 oz (110.1 kg)   SpO2 100%  BMI 29.55 kg/m   Wt Readings from Last 3 Encounters:  07/04/18 242 lb 12.8 oz (110.1 kg)  05/22/18 248 lb (112.5 kg)  05/17/18 244 lb (110.7 kg)    Physical Exam Vitals signs and nursing note reviewed.  Constitutional:      General: He is not in acute distress.    Appearance: He is  well-developed. He is not diaphoretic.     Comments: Well-appearing, comfortable, cooperative  HENT:     Head: Normocephalic and atraumatic.     Comments: Frontal / maxillary sinuses non-tender. Nares patent without purulence or edema. Bilateral TMs clear without erythema, effusion or bulging. Oropharynx clear without erythema, exudates, edema or asymmetry. Eyes:     General:        Right eye: No discharge.        Left eye: No discharge.     Conjunctiva/sclera: Conjunctivae normal.     Pupils: Pupils are equal, round, and reactive to light.  Neck:     Musculoskeletal: Normal range of motion and neck supple.     Thyroid: No thyromegaly.  Cardiovascular:     Rate and Rhythm: Normal rate and regular rhythm.     Heart sounds: Normal heart sounds. No murmur.  Pulmonary:     Effort: Pulmonary effort is normal. No respiratory distress.     Breath sounds: Normal breath sounds. No wheezing or rales.  Abdominal:     General: Bowel sounds are normal. There is no distension.     Palpations: Abdomen is soft. There is no mass.     Tenderness: There is no abdominal tenderness.  Musculoskeletal: Normal range of motion.        General: No tenderness.     Comments: Upper / Lower Extremities: - Normal muscle tone, strength bilateral upper extremities 5/5, lower extremities 5/5  Lymphadenopathy:     Cervical: No cervical adenopathy.  Skin:    General: Skin is warm and dry.     Findings: No erythema or rash.  Neurological:     Mental Status: He is alert and oriented to person, place, and time.     Comments: Distal sensation intact to light touch all extremities  Psychiatric:        Behavior: Behavior normal.     Comments: Well groomed, good eye contact, normal speech and thoughts      Diabetic Foot Exam - Simple   Simple Foot Form Diabetic Foot exam was performed with the following findings:  Yes 07/04/2018  9:49 AM  Visual Inspection See comments:  Yes Sensation Testing Intact to touch and  monofilament testing bilaterally:  Yes Pulse Check Posterior Tibialis and Dorsalis pulse intact bilaterally:  Yes Comments Bilateral feet with dry skin and some callus formation. No ulceration.     Results for orders placed or performed in visit on 06/26/18  Hepatitis C antibody  Result Value Ref Range   Hepatitis C Ab NON-REACTIVE NON-REACTI   SIGNAL TO CUT-OFF 0.12 <1.00  PSA  Result Value Ref Range   PSA 1.2 < OR = 4.0 ng/mL  Lipid panel  Result Value Ref Range   Cholesterol 109 <200 mg/dL   HDL 34 (L) >40 mg/dL   Triglycerides 89 <150 mg/dL   LDL Cholesterol (Calc) 58 mg/dL (calc)   Total CHOL/HDL Ratio 3.2 <5.0 (calc)   Non-HDL Cholesterol (Calc) 75 <130 mg/dL (calc)  COMPLETE METABOLIC PANEL WITH GFR  Result Value Ref Range   Glucose, Bld 207 (H) 65 - 99 mg/dL  BUN 20 7 - 25 mg/dL   Creat 1.37 (H) 0.70 - 1.25 mg/dL   GFR, Est Non African American 53 (L) > OR = 60 mL/min/1.27m2   GFR, Est African American 62 > OR = 60 mL/min/1.19m2   BUN/Creatinine Ratio 15 6 - 22 (calc)   Sodium 137 135 - 146 mmol/L   Potassium 4.8 3.5 - 5.3 mmol/L   Chloride 98 98 - 110 mmol/L   CO2 28 20 - 32 mmol/L   Calcium 9.9 8.6 - 10.3 mg/dL   Total Protein 7.0 6.1 - 8.1 g/dL   Albumin 4.3 3.6 - 5.1 g/dL   Globulin 2.7 1.9 - 3.7 g/dL (calc)   AG Ratio 1.6 1.0 - 2.5 (calc)   Total Bilirubin 0.8 0.2 - 1.2 mg/dL   Alkaline phosphatase (APISO) 92 40 - 115 U/L   AST 25 10 - 35 U/L   ALT 31 9 - 46 U/L  CBC with Differential/Platelet  Result Value Ref Range   WBC 8.3 3.8 - 10.8 Thousand/uL   RBC 4.89 4.20 - 5.80 Million/uL   Hemoglobin 14.8 13.2 - 17.1 g/dL   HCT 43.8 38.5 - 50.0 %   MCV 89.6 80.0 - 100.0 fL   MCH 30.3 27.0 - 33.0 pg   MCHC 33.8 32.0 - 36.0 g/dL   RDW 12.2 11.0 - 15.0 %   Platelets 248 140 - 400 Thousand/uL   MPV 12.0 7.5 - 12.5 fL   Neutro Abs 5,121 1,500 - 7,800 cells/uL   Lymphs Abs 2,191 850 - 3,900 cells/uL   Absolute Monocytes 722 200 - 950 cells/uL    Eosinophils Absolute 208 15 - 500 cells/uL   Basophils Absolute 58 0 - 200 cells/uL   Neutrophils Relative % 61.7 %   Total Lymphocyte 26.4 %   Monocytes Relative 8.7 %   Eosinophils Relative 2.5 %   Basophils Relative 0.7 %  Hemoglobin A1c  Result Value Ref Range   Hgb A1c MFr Bld 9.3 (H) <5.7 % of total Hgb   Mean Plasma Glucose 220 (calc)   eAG (mmol/L) 12.2 (calc)      Assessment & Plan:   Problem List Items Addressed This Visit    CKD (chronic kidney disease), stage III (HCC)    Secondary to DM, HTN, age With CKD-III On ACEi      Essential hypertension (Chronic)    Controlled HTN Complicated by CAD, CKD-III   Plan:  1. Continue current BP regimen - Metoprolol 50mg  BID, Lisiniopril 5mg  daily, Isosorbide mononite 2. Encourage improved lifestyle - low sodium diet, regular exercise 3. Continue monitor BP outside office, bring readings to next visit, if persistently >140/90 or new symptoms notify office sooner       Hyperlipidemia associated with type 2 diabetes mellitus ([place])    Well controlled cholesterol on statin  Last lipid panel 06/2017 Known CAD CABG  Plan: 1. Continue current meds - Atorvastatin 80mg  daily, Fish Oil omega 3 2. Continue ASA 81mg  for secondary ASCVD risk reduction 3. Encourage improved lifestyle - low carb/cholesterol, reduce portion size, continue improving regular exercise       Relevant Medications   metFORMIN (GLUCOPHAGE) 500 MG tablet   Paroxysmal atrial fibrillation (HCC)    Stable without evidence of AFib on exam On anticoagulation Followed by Cardiology      Type 2 diabetes mellitus with diabetic nephropathy ([place])    Uncontrolled DM with hyperglycemia, A1c 9.3 attributed to poor diet/lifestyle/exercise Complications - CKD-III, hyperlipidemia, CAD, obesity - increases  risk of future cardiovascular complications   Plan:  Discussed DM diagnosis, complications, treatmet  1. START Metformin 500mg  BID - titrate up from 500 nightly  to BID then in future consider double dose to 1000mg  BID - Future consider injectable GLP1 meds, due to CKD limit other options 2. Encourage improved lifestyle - low carb, low sugar diet, reduce portion size, continue improving and start more regular exercise 3. Check CBG, bring log to next visit for review - check insurance for covered brand glucometer call back, or check with [employer] meter 4. Continue ASA, ACEi, Statin 5. DM Foot exam done today / Advised to schedule DM ophtho exam, send record 6. Follow-up 3 months DM A1c - med adjust       Relevant Medications   metFORMIN (GLUCOPHAGE) 500 MG tablet    Other Visit Diagnoses    Annual physical exam    -  Primary   Need for vaccination with 13-polyvalent pneumococcal conjugate vaccine       Relevant Orders   Pneumococcal conjugate vaccine 13-valent IM (Completed)      Updated Health Maintenance information Reviewed recent lab results with patient Encouraged improvement to lifestyle with diet and exercise - Goal of weight loss   Meds ordered this encounter  Medications  . metFORMIN (GLUCOPHAGE) 500 MG tablet    Sig: Take 1 tablet (500 mg total) by mouth 2 (two) times daily with a meal.    Dispense:  180 tablet    Refill:  1    Follow up plan: Return in about 3 months (around 10/03/2018) for Diabetes A1c.  Nobie Putnam, [facility] 07/04/2018, 9:50 AM

## 2018-07-04 NOTE — Patient Instructions (Addendum)
Thank you for coming to the office today.  First Pneumonia vaccine today - Prevnar-13 - good for 1 year, then booster and then done forever.  ------------------------   New diagnosis of type 2 diabetes as discussed.  Keep improving diet, low carb, low sugar, regular exercise  Goal to lose weight and lower sugar  Start Metformin 500mg  twice daily with meals for next 3 months until next check - FIRST FEW WEEKS - start ONLY evening dose with dinner, then eventually add 2nd pill in morning for TWICE daily - We can adjust dose in future if need  Future medicines are Trulicity, Ozempic, Bydureon once week injectable that do very well.  For glucometer or testing meter - you can check with insurance company to find the brand name and type that they will pay for - find out and call me back and leave message, we will send it to them. - Usually an Accu-chek type or One Touch type  OR you can go to [employer] and purchase OTC glucometer - Relion meter and test supplies  Testing is not required but helpful to learn more about your sugar - Check once daily fasting in morning, write it down, and check optional 2 hour after a meal lunch or dinner or bedtime if you want as well to learn  --------------------------------  Your provider would like to you have your annual eye exam. Please contact your current eye doctor or here are some good options for you to contact.   Have them FAX [place] a copy of the report Diabetic Eye Report ---------------  [facility]   Address: [address] [place], [place] [address] Phone: [phone]  Website: [email or web address] [address], [place], [place] [address] Phone: [phone] [email or web address]  [facility]  Address: [place], [place], [place] [address] Phone: [phone]   [facility] [address] [place], [place] [address] Phone: [phone]  [facility] Address: [address],  [place], [place] [address]  Phone: [phone]  ---------------------------------------------- Lab result review  1. Chemistry - Abnormal result with elevated Creatinine 1.37 consistent with Chronic Kidney Disease - Stage III. Liver function and electrolytes norma. - Significantly elevated fasting blood sugar due to diabetes.   2. Hemoglobin A1c (Diabetes screening) - 9.3, significantly elevated, now in Diabetes Range - will discuss further.  3. Routine screening Hepatitis C - Negative.  4. PSA Prostate Cancer Screening - 1.2, negative.  5. Cholesterol - Normal cholesterol results. On atorvastatin  6. CBC Blood Counts - Normal, no anemia, other abnormality  Next visit 3 months - will check fingerstick sugar for 3 month average   Please schedule a Follow-up Appointment to: Return in about 3 months (around 10/03/2018) for Diabetes A1c.  If you have any other questions or concerns, please feel free to call the office or send a message through [place]. You may also schedule an earlier appointment if necessary.  Additionally, you may be receiving a survey about your experience at our office within a few days to 1 week by e-mail or mail. We value your feedback.  Nobie Putnam, DO [place]

## 2018-07-05 NOTE — Assessment & Plan Note (Signed)
Well controlled cholesterol on statin  Last lipid panel 06/2017 Known CAD CABG  Plan: 1. Continue current meds - Atorvastatin 80mg  daily, Fish Oil omega 3 2. Continue ASA 81mg  for secondary ASCVD risk reduction 3. Encourage improved lifestyle - low carb/cholesterol, reduce portion size, continue improving regular exercise

## 2018-07-05 NOTE — Assessment & Plan Note (Signed)
Secondary to DM, HTN, age With CKD-III On ACEi 

## 2018-07-05 NOTE — Assessment & Plan Note (Signed)
Stable without evidence of AFib on exam On anticoagulation Followed by Cardiology

## 2018-07-05 NOTE — Assessment & Plan Note (Signed)
Uncontrolled DM with hyperglycemia, A1c 9.3 attributed to poor diet/lifestyle/exercise Complications - CKD-III, hyperlipidemia, CAD, obesity - increases risk of future cardiovascular complications   Plan:  Discussed DM diagnosis, complications, treatmet  1. START Metformin 500mg  BID - titrate up from 500 nightly to BID then in future consider double dose to 1000mg  BID - Future consider injectable GLP1 meds, due to CKD limit other options 2. Encourage improved lifestyle - low carb, low sugar diet, reduce portion size, continue improving and start more regular exercise 3. Check CBG, bring log to next visit for review - check insurance for covered brand glucometer call back, or check with [employer] Relion meter 4. Continue ASA, ACEi, Statin 5. DM Foot exam done today / Advised to schedule DM ophtho exam, send record 6. Follow-up 3 months DM A1c - med adjust

## 2018-07-05 NOTE — Assessment & Plan Note (Signed)
Controlled HTN Complicated by CAD, CKD-III    Plan:  1. Continue current BP regimen - Metoprolol 50mg BID, Lisiniopril 5mg daily, Isosorbide mononite 2. Encourage improved lifestyle - low sodium diet, regular exercise 3. Continue monitor BP outside office, bring readings to next visit, if persistently >140/90 or new symptoms notify office sooner 

## 2018-10-05 ENCOUNTER — Other Ambulatory Visit: Payer: Self-pay

## 2018-10-05 DIAGNOSIS — E1121 Type 2 diabetes mellitus with diabetic nephropathy: Secondary | ICD-10-CM

## 2018-10-06 ENCOUNTER — Other Ambulatory Visit: Payer: Self-pay

## 2018-10-06 DIAGNOSIS — E1121 Type 2 diabetes mellitus with diabetic nephropathy: Secondary | ICD-10-CM | POA: Diagnosis not present

## 2018-10-07 LAB — HEMOGLOBIN A1C
Hgb A1c MFr Bld: 7.3 % of total Hgb — ABNORMAL HIGH (ref <5.7)
Mean Plasma Glucose: 163 (calc)
eAG (mmol/L): 9 (calc)

## 2018-10-10 ENCOUNTER — Encounter: Payer: Self-pay | Admitting: Family Medicine

## 2018-10-10 ENCOUNTER — Ambulatory Visit (INDEPENDENT_AMBULATORY_CARE_PROVIDER_SITE_OTHER): Payer: Medicare HMO | Admitting: Family Medicine

## 2018-10-10 ENCOUNTER — Other Ambulatory Visit: Payer: Self-pay

## 2018-10-10 ENCOUNTER — Other Ambulatory Visit: Payer: Self-pay | Admitting: Family Medicine

## 2018-10-10 DIAGNOSIS — E1121 Type 2 diabetes mellitus with diabetic nephropathy: Secondary | ICD-10-CM | POA: Diagnosis not present

## 2018-10-10 NOTE — Assessment & Plan Note (Signed)
Significantly improved DM now with resolved hyperglycemia - A1c down to 7.3, from 9.3 on lifestyle and metformin new start. Previously new dx DM Complications - CKD-III, hyperlipidemia, CAD, obesity - increases risk of future cardiovascular complications   Plan:  1. Continue current Metformin 500mg  BID - hold on dose increase but in future if need may increase up from 500 nightly to BID then in future consider double dose to 1000mg  BID - Future consider injectable GLP1 meds, due to CKD limit other options 2. Encourage improved lifestyle - low carb, low sugar diet, reduce portion size, continue improving and start more regular exercise 3. Check CBG, bring log to next visit for review - check insurance for covered brand glucometer call back if need 4. Continue ASA, ACEi, Statin 5. Advised to schedule DM ophtho exam, send record - when reduced risk of coronavirus in community 6. Follow-up 3 months DM A1c / Chemistry - med adjust

## 2018-10-10 NOTE — Progress Notes (Signed)
Virtual Visit via Telephone The purpose of this virtual visit is to provide medical care while limiting exposure to the novel coronavirus (COVID19) for both patient and office staff.  Consent was obtained for phone visit:  Yes.   Answered questions that patient had about telehealth interaction:  Yes.   I discussed the limitations, risks, security and privacy concerns of performing an evaluation and management service by telephone. I also discussed with the patient that there may be a patient responsible charge related to this service. The patient expressed understanding and agreed to proceed.  Patient Location: Home Provider Location: [facility])  ---------------------------------------------------------------------- Chief Complaint  Patient presents with  . Diabetes    fasting glucose 130 - 140 mcg     S: Reviewed CMA documentation. I have called patient, spoke with patient and his wife Izora Gala on speaker phone, and gathered additional HPI as follows:  Type 2 Diabetes, with Nephropathy CKD III - Last visit with me 07/04/18, for same problem with new diagnosis Type 2 Diabetes, treated with new start Metformin and lifestyle improvement, see prior notes for background information. - Interval update with last A1c down to 7.3, previously 9.3 - Today patient reports doing well with improved lifestyle diet and has had improved sugars CBGs: Avg 120-140, low 95 Meds: Metformin 500mg  BID - tolerating well without side effects Currently on ACEi Lifestyle: - Diet (improving diabetic diet, reduced potatoes and starches) - Exercise (improved exercise outdoors, yard work, stationary bike 3-5 miles most days now) - Father with diabetes He does not have an eye doctor for DM eye exam - they tried to schedule but cancelled d/t coronavirus concerns Improved polyuria Denies hypoglycemia, visual changes, numbness or tingling.  Denies any high risk travel to areas of current concern  for COVID19. Denies any known or suspected exposure to person with or possibly with COVID19.  Denies any fevers, chills, sweats, body ache, cough, shortness of breath, sinus pain or pressure, headache, abdominal pain, diarrhea  Health Maintenance: He is due for colonoscopy 2020, but did not scheduled due to coronavirus, he will plan to re-schedule once able to  Past Medical History:  Diagnosis Date  . Allergy   . Coronary artery disease 2013   a. [place] 2013 w/ severe 3-V CAD; b. s/p 4-V CABG in 2013 (LIMA-LAD, VG-D2, seq VG-PLA/PDA, VG-OM; c. LHC 2015 patent LIMA-LAD, occluded VGs, med Rx  . Headache(784.0)   . PAF (paroxysmal atrial fibrillation) (HCC)    a. noted post-operatively following CABG in 2013; b. recurrence 06/2017; c. CHADS2VASc => 4 (HTN, age x 1, DM, vascular disease); d. On Eliquis   Social History   Tobacco Use  . Smoking status: Never Smoker  . Smokeless tobacco: Never Used  Substance Use Topics  . Alcohol use: Not Currently  . Drug use: No    Current Outpatient Medications:  .  acetaminophen (TYLENOL) 325 MG tablet, Take 650 mg by mouth every 6 (six) hours as needed for headache., Disp: , Rfl:  .  apixaban (ELIQUIS) 5 MG TABS tablet, Take 1 tablet (5 mg total) by mouth 2 (two) times daily., Disp: 180 tablet, Rfl: 3 .  aspirin EC 81 MG tablet, Take 81 mg by mouth daily., Disp: , Rfl:  .  atorvastatin (LIPITOR) 80 MG tablet, TAKE 1 TABLET BY MOUTH AT 6PM AS DIRECTED, Disp: 90 tablet, Rfl: 3 .  Coenzyme Q10 (CO Q 10) 100 MG CAPS, Take 200 mg by mouth daily., Disp: 30 capsule, Rfl: 6 .  fish oil-omega-3 fatty acids 1000 MG capsule, Take 1 g by mouth daily., Disp: , Rfl:  .  isosorbide mononitrate (IMDUR) 30 MG 24 hr tablet, Take 1 tablet (30 mg total) by mouth daily., Disp: 90 tablet, Rfl: 3 .  lisinopril (PRINIVIL,ZESTRIL) 5 MG tablet, Take 1 tablet (5 mg total) by mouth daily., Disp: 90 tablet, Rfl: 3 .  metFORMIN (GLUCOPHAGE) 500 MG tablet, Take 1 tablet (500 mg  total) by mouth 2 (two) times daily with a meal., Disp: 180 tablet, Rfl: 1 .  metoprolol tartrate (LOPRESSOR) 50 MG tablet, Take 1 tablet (50 mg total) by mouth 2 (two) times daily., Disp: 180 tablet, Rfl: 3 .  nitroGLYCERIN (NITROSTAT) 0.4 MG SL tablet, Place 1 tablet (0.4 mg total) under the tongue every 5 (five) minutes as needed for chest pain. (Patient not taking: Reported on 10/10/2018), Disp: 25 tablet, Rfl: 3  Depression screen [facility] 2/9 07/04/2018 05/22/2018 06/19/2012  Decreased Interest 0 0 0  Down, Depressed, Hopeless 0 0 0  PHQ - 2 Score 0 0 0    No flowsheet data found.  -------------------------------------------------------------------------- O: No physical exam performed due to remote telephone encounter.  Lab results reviewed.  Recent Labs    06/27/18 0755 10/06/18 0837  HGBA1C 9.3* 7.3*    Recent Results (from the past 2160 hour(s))  HgB A1c     Status: Abnormal   Collection Time: 10/06/18  8:37 AM  Result Value Ref Range   Hgb A1c MFr Bld 7.3 (H) <5.7 % of total Hgb    Comment: For someone without known diabetes, a hemoglobin A1c value of 6.5% or greater indicates that they may have  diabetes and this should be confirmed with a follow-up  test. . For someone with known diabetes, a value <7% indicates  that their diabetes is well controlled and a value  greater than or equal to 7% indicates suboptimal  control. A1c targets should be individualized based on  duration of diabetes, age, comorbid conditions, and  other considerations. . Currently, no consensus exists regarding use of hemoglobin A1c for diagnosis of diabetes for children. .    Mean Plasma Glucose 163 (calc)   eAG (mmol/L) 9.0 (calc)    -------------------------------------------------------------------------- A&P:  Problem List Items Addressed This Visit    Type 2 diabetes mellitus with diabetic nephropathy ([place]) - Primary    Significantly improved DM now with resolved hyperglycemia - A1c  down to 7.3, from 9.3 on lifestyle and metformin new start. Previously new dx DM Complications - CKD-III, hyperlipidemia, CAD, obesity - increases risk of future cardiovascular complications   Plan:  1. Continue current Metformin 500mg  BID - hold on dose increase but in future if need may increase up from 500 nightly to BID then in future consider double dose to 1000mg  BID - Future consider injectable GLP1 meds, due to CKD limit other options 2. Encourage improved lifestyle - low carb, low sugar diet, reduce portion size, continue improving and start more regular exercise 3. Check CBG, bring log to next visit for review - check insurance for covered brand glucometer call back if need 4. Continue ASA, ACEi, Statin 5. Advised to schedule DM ophtho exam, send record - when reduced risk of coronavirus in community 6. Follow-up 3 months DM A1c / Chemistry - med adjust      Relevant Orders   BASIC METABOLIC PANEL WITH GFR   Hemoglobin A1c      No orders of the defined types were placed in this encounter.  Follow-up: - Return in 3 months for Diabetes, CKD - Future labs ordered for 01/08/19  Patient verbalizes understanding with the above medical recommendations including the limitation of remote medical advice.  Specific follow-up and call-back criteria were given for patient to follow-up or seek medical care more urgently if needed.   - Time spent in direct consultation with patient on phone: 8 minutes   Nobie Putnam, [facility] 10/10/2018, 8:18 AM

## 2018-10-10 NOTE — Patient Instructions (Addendum)
Recent Labs    06/27/18 0755 10/06/18 0837  HGBA1C 9.3* 7.3*   Keep up good work on lifestyle and diet and medicine  DUE for FASTING BLOOD WORK (no food or drink after midnight before the lab appointment, only water or coffee without cream/sugar on the morning of)  SCHEDULE "Lab Only" visit in the morning at the clinic for lab draw in 3 MONTHS   - Make sure Lab Only appointment is at about 1 week before your next appointment, so that results will be available  For Lab Results, once available within 2-3 days of blood draw, you can can log in to MyChart online to view your results and a brief explanation. Also, we can discuss results at next follow-up visit.   Please schedule a Follow-up Appointment to: Return in about 3 months (around 01/09/2019) for DM CKD.  If you have any other questions or concerns, please feel free to call the office or send a message through [place]. You may also schedule an earlier appointment if necessary.  Additionally, you may be receiving a survey about your experience at our office within a few days to 1 week by e-mail or mail. We value your feedback.  Nobie Putnam, DO [place]

## 2018-12-24 ENCOUNTER — Other Ambulatory Visit: Payer: Self-pay | Admitting: Family Medicine

## 2018-12-24 DIAGNOSIS — E1121 Type 2 diabetes mellitus with diabetic nephropathy: Secondary | ICD-10-CM

## 2019-01-08 ENCOUNTER — Other Ambulatory Visit: Payer: Medicare HMO

## 2019-01-08 DIAGNOSIS — E1121 Type 2 diabetes mellitus with diabetic nephropathy: Secondary | ICD-10-CM | POA: Diagnosis not present

## 2019-01-09 LAB — BASIC METABOLIC PANEL WITH GFR
BUN/Creatinine Ratio: 16 (calc) (ref 6–22)
BUN: 20 mg/dL (ref 7–25)
CO2: 25 mmol/L (ref 20–32)
Calcium: 10 mg/dL (ref 8.6–10.3)
Chloride: 102 mmol/L (ref 98–110)
Creat: 1.29 mg/dL — ABNORMAL HIGH (ref 0.70–1.25)
GFR, Est African American: 66 mL/min/{1.73_m2} (ref >=60)
GFR, Est Non African American: 57 mL/min/{1.73_m2} — ABNORMAL LOW (ref >=60)
Glucose, Bld: 150 mg/dL — ABNORMAL HIGH (ref 65–99)
Potassium: 5.1 mmol/L (ref 3.5–5.3)
Sodium: 138 mmol/L (ref 135–146)

## 2019-01-09 LAB — HEMOGLOBIN A1C
Hgb A1c MFr Bld: 6.8 % of total Hgb — ABNORMAL HIGH (ref <5.7)
Mean Plasma Glucose: 148 (calc)
eAG (mmol/L): 8.2 (calc)

## 2019-01-15 ENCOUNTER — Encounter: Payer: Self-pay | Admitting: Family Medicine

## 2019-01-15 ENCOUNTER — Other Ambulatory Visit: Payer: Self-pay

## 2019-01-15 ENCOUNTER — Ambulatory Visit (INDEPENDENT_AMBULATORY_CARE_PROVIDER_SITE_OTHER): Payer: Medicare HMO | Admitting: Family Medicine

## 2019-01-15 VITALS — BP 128/80 | HR 66 | Temp 98.3°F | Resp 16 | Ht 76.0 in | Wt 226.0 lb

## 2019-01-15 DIAGNOSIS — E1121 Type 2 diabetes mellitus with diabetic nephropathy: Secondary | ICD-10-CM

## 2019-01-15 DIAGNOSIS — E663 Overweight: Secondary | ICD-10-CM

## 2019-01-15 DIAGNOSIS — I10 Essential (primary) hypertension: Secondary | ICD-10-CM | POA: Diagnosis not present

## 2019-01-15 DIAGNOSIS — Z1211 Encounter for screening for malignant neoplasm of colon: Secondary | ICD-10-CM | POA: Diagnosis not present

## 2019-01-15 DIAGNOSIS — N183 Chronic kidney disease, stage 3 unspecified: Secondary | ICD-10-CM

## 2019-01-15 NOTE — Patient Instructions (Addendum)
Thank you for coming to the office today.  Recent Labs    06/27/18 0755 10/06/18 0837 01/08/19 0852  HGBA1C 9.3* 7.3* 6.8*    Your provider would like to you have your annual eye exam. Try to schedule this before end of year.  [facility]   Address: [address] [place], [place] [address] Phone: [phone]  Website: [email or web address]  Colon Cancer Screening: - For all adults age 67+ routine colon cancer screening is highly recommended.     - Recent guidelines from [place] recommend starting age of 36 - Early detection of colon cancer is important, because often there are no warning signs or symptoms, also if found early usually it can be cured. Late stage is hard to treat.  - If you are not interested in Colonoscopy screening (if done and normal you could be cleared for 5 to 10 years until next due), then Cologuard is an excellent alternative for screening test for Colon Cancer. It is highly sensitive for detecting DNA of colon cancer from even the earliest stages. Also, there is NO bowel prep required. - If Cologuard is NEGATIVE, then it is good for 3 years before next due - If Cologuard is POSITIVE, then it is strongly advised to get a Colonoscopy, which allows the GI doctor to locate the source of the cancer or polyp (even very early stage) and treat it by removing it. ------------------------- If you would like to proceed with Cologuard (stool DNA test)   Follow instructions to collect sample, you may call the company for any help or questions, 24/7 telephone support at [phone].     Please schedule a Follow-up Appointment to: Return in about 3 months (around 04/17/2019) for 3 months DM A1c, HTN.  If you have any other questions or concerns, please feel free to call the office or send a message through [place]. You may also schedule an earlier appointment if necessary.  Additionally, you may be receiving a survey about your experience at our  office within a few days to 1 week by e-mail or mail. We value your feedback.  Nobie Putnam, DO [place]

## 2019-01-15 NOTE — Assessment & Plan Note (Signed)
Controlled HTN Complicated by CAD, CKD-III    Plan:  1. Continue current BP regimen - Metoprolol 50mg  BID, Lisiniopril 5mg  daily, Isosorbide mononite 2. Encourage improved lifestyle - low sodium diet, regular exercise 3. Continue monitor BP outside office, bring readings to next visit, if persistently >140/90 or new symptoms notify office sooner

## 2019-01-15 NOTE — Assessment & Plan Note (Signed)
Improved weight loss with lifestyle and medicine

## 2019-01-15 NOTE — Assessment & Plan Note (Signed)
Continued notable improved DM with A1c from 7.3 down to 6.8 - on lifestyle and metformin Resolved hyperglycemia Complications - CKD-III, hyperlipidemia, CAD, obesity - increases risk of future cardiovascular complications   Plan:  1. Continue current Metformin 500mg  BID - hold on dose increase still, consider in future - Future consider injectable GLP1 meds, due to CKD limit other options 2. Encourage improved lifestyle - low carb, low sugar diet, reduce portion size, continue improving and start more regular exercise 3. Check CBG, bring log to next visit for review - check insurance for covered brand glucometer call back if need 4. Continue ASA, ACEi, Statin 5. Advised to schedule DM ophtho exam, send record - when reduced risk of coronavirus in community - again he deferred today 6. Follow-up 3 months DM A1c

## 2019-01-15 NOTE — Assessment & Plan Note (Signed)
Secondary to DM, HTN, age With CKD-III - improved Creatinine trend in 6 months On ACEi

## 2019-01-15 NOTE — Progress Notes (Signed)
Subjective:    Patient ID: [name] [name], male    DOB: 1950-10-15, 68 y.o.   MRN: [id number]  [name] [name] is a 68 y.o. male presenting on 01/15/2019 for Diabetes   HPI   Type 2 Diabetes, with Nephropathy CKD III - Last visit with me 09/2018, for same problem diabetes, treated with continued on Metformin and other lifestyle intervention, see prior notes for background information. - Interval update with improved weight loss and A1c reduction on lab now to 6.8, previously 9.3-7.3 - Today patient reports doing very well - Other dietary changes, started on some green tea, to help regulate his bowels, no less constipation CBGs: Avg 120-140, low <100 rarely, without hypoglycemia Meds:Metformin 500mg  BID - tolerating well without side effects Currently on ACEi Lifestyle: - Weight down 16 lbs in 3 months - Diet (improving diabetic diet, reduced potatoes and starches) - Exercise (improved exercise outdoors, yard work he is tolerating exertion even better,stationarybike) - Father with diabetes He does not have an eye doctor for DM eye exam - they are waiting to schedule but cancelled d/t coronavirus concerns Denies hypoglycemia, visual changes, numbness or tingling, polyuria   CHRONIC HTN: Reports no new concerns. He plans to change to decaf Current Meds - Lisinopril 5mg  daily, Imdur 30mg  daily, Metoprolol 50mg  BID   Reports good compliance, took meds today. Tolerating well, w/o complaints. Denies CP, dyspnea, HA, edema, dizziness / lightheadedness   Depression screen [facility] 2/9 01/15/2019 07/04/2018 05/22/2018  Decreased Interest 0 0 0  Down, Depressed, Hopeless 0 0 0  PHQ - 2 Score 0 0 0    Social History   Tobacco Use  . Smoking status: Never Smoker  . Smokeless tobacco: Never Used  Substance Use Topics  . Alcohol use: Not Currently  . Drug use: No    Review of Systems Per HPI unless specifically indicated above     Objective:    BP 128/80 (BP Location: Left Arm,  Cuff Size: Normal)   Pulse 66   Temp 98.3 F (36.8 C) (Oral)   Resp 16   Ht 6\' 4"  (1.93 m)   Wt 226 lb (102.5 kg)   BMI 27.51 kg/m   Wt Readings from Last 3 Encounters:  01/15/19 226 lb (102.5 kg)  07/04/18 242 lb 12.8 oz (110.1 kg)  05/22/18 248 lb (112.5 kg)    Physical Exam Vitals signs and nursing note reviewed.  Constitutional:      General: He is not in acute distress.    Appearance: He is well-developed. He is not diaphoretic.     Comments: Well-appearing, comfortable, cooperative  HENT:     Head: Normocephalic and atraumatic.  Eyes:     General:        Right eye: No discharge.        Left eye: No discharge.     Conjunctiva/sclera: Conjunctivae normal.  Neck:     Musculoskeletal: Normal range of motion and neck supple.     Thyroid: No thyromegaly.  Cardiovascular:     Rate and Rhythm: Normal rate and regular rhythm.     Heart sounds: Normal heart sounds. No murmur.  Pulmonary:     Effort: Pulmonary effort is normal. No respiratory distress.     Breath sounds: Normal breath sounds. No wheezing or rales.  Musculoskeletal: Normal range of motion.     Comments: R>L varicose veins swollen, non tender no erythema  Lymphadenopathy:     Cervical: No cervical adenopathy.  Skin:  General: Skin is warm and dry.     Findings: No erythema or rash.  Neurological:     Mental Status: He is alert and oriented to person, place, and time.  Psychiatric:        Behavior: Behavior normal.     Comments: Well groomed, good eye contact, normal speech and thoughts     Recent Labs    06/27/18 0755 10/06/18 0837 01/08/19 0852  HGBA1C 9.3* 7.3* 6.8*     Results for orders placed or performed in visit on 01/08/19  Hemoglobin A1c  Result Value Ref Range   Hgb A1c MFr Bld 6.8 (H) <5.7 % of total Hgb   Mean Plasma Glucose 148 (calc)   eAG (mmol/L) 8.2 (calc)  BASIC METABOLIC PANEL WITH GFR  Result Value Ref Range   Glucose, Bld 150 (H) 65 - 99 mg/dL   BUN 20 7 - 25 mg/dL    Creat 1.29 (H) 0.70 - 1.25 mg/dL   GFR, Est Non African American 57 (L) > OR = 60 mL/min/1.63m2   GFR, Est African American 66 > OR = 60 mL/min/1.58m2   BUN/Creatinine Ratio 16 6 - 22 (calc)   Sodium 138 135 - 146 mmol/L   Potassium 5.1 3.5 - 5.3 mmol/L   Chloride 102 98 - 110 mmol/L   CO2 25 20 - 32 mmol/L   Calcium 10.0 8.6 - 10.3 mg/dL      Assessment & Plan:   Problem List Items Addressed This Visit    CKD (chronic kidney disease), stage III (HCC)    Secondary to DM, HTN, age With CKD-III - improved Creatinine trend in 6 months On ACEi      Essential hypertension (Chronic)    Controlled HTN Complicated by CAD, CKD-III    Plan:  1. Continue current BP regimen - Metoprolol 50mg  BID, Lisiniopril 5mg  daily, Isosorbide mononite 2. Encourage improved lifestyle - low sodium diet, regular exercise 3. Continue monitor BP outside office, bring readings to next visit, if persistently >140/90 or new symptoms notify office sooner      Overweight (BMI 25.0-29.9)    Improved weight loss with lifestyle and medicine      Type 2 diabetes mellitus with diabetic nephropathy ([place]) - Primary    Continued notable improved DM with A1c from 7.3 down to 6.8 - on lifestyle and metformin Resolved hyperglycemia Complications - CKD-III, hyperlipidemia, CAD, obesity - increases risk of future cardiovascular complications   Plan:  1. Continue current Metformin 500mg  BID - hold on dose increase still, consider in future - Future consider injectable GLP1 meds, due to CKD limit other options 2. Encourage improved lifestyle - low carb, low sugar diet, reduce portion size, continue improving and start more regular exercise 3. Check CBG, bring log to next visit for review - check insurance for covered brand glucometer call back if need 4. Continue ASA, ACEi, Statin 5. Advised to schedule DM ophtho exam, send record - when reduced risk of coronavirus in community - again he deferred today 6. Follow-up  3 months DM A1c       Other Visit Diagnoses    Screening for colon cancer       Relevant Orders   Cologuard      Due for routine colon cancer screening.  Last colonoscopy about 10 year ago, reported 0 polyps - Discussion today about recommendations for either Colonoscopy or Cologuard screening, benefits and risks of screening, interested in Cologuard, understands that if positive then recommendation is for  diagnostic colonoscopy to follow-up. He declines ready to go get colonoscopy due to coronavirus risk - Ordered Cologuard today   No orders of the defined types were placed in this encounter.  Orders Placed This Encounter  Procedures  . Cologuard       Follow up plan: Return in about 3 months (around 04/17/2019) for 3 months DM A1c, HTN.   Nobie Putnam, [facility] 01/15/2019, 8:20 AM

## 2019-03-04 DIAGNOSIS — Z1212 Encounter for screening for malignant neoplasm of rectum: Secondary | ICD-10-CM | POA: Diagnosis not present

## 2019-03-04 DIAGNOSIS — Z1211 Encounter for screening for malignant neoplasm of colon: Secondary | ICD-10-CM | POA: Diagnosis not present

## 2019-03-04 LAB — COLOGUARD

## 2019-03-12 LAB — COLOGUARD: Cologuard: POSITIVE — AB

## 2019-03-13 ENCOUNTER — Telehealth: Payer: Self-pay | Admitting: Family Medicine

## 2019-03-13 ENCOUNTER — Encounter: Payer: Self-pay | Admitting: Family Medicine

## 2019-03-13 DIAGNOSIS — R195 Other fecal abnormalities: Secondary | ICD-10-CM

## 2019-03-13 NOTE — Telephone Encounter (Signed)
I attempted to call patient 03/13/19 at 530 regarding Cologuard results, I did not reach him. Left voicemail for him to call us back for his results. I did not leave results on his voicemail - as it was not personalized.  Please try to contact patient / or if he calls back share his Cologuard results with him:  Cologuard result = POSITIVE (abnormal).  This is an abnormal result. And it means that we need more testing to determine if there is a problem with your colon.  I do not want you to be alarmed. Most of the time we get a positive Cologuard result, usually it is due to a Polyp that is either benign or possibly pre-cancer that is caught very early and is treatable before it ever turns into a problem or cancer.  The next step is a Colonoscopy procedure to look for the abnormality that caused the positive Cologuard test.  Next we need to place a referral to get you scheduled with a GI specialist  - Previous doctor was in [place] - Dr Cristina Gong at [facility]  Let us know where you would like to go and we can order the referral.  If you would like to stay local -   [facility]) [address] [place], [place] [address] Phone: [phone]  Nobie Putnam, [place] 03/13/2019, 5:41 PM

## 2019-03-14 NOTE — Telephone Encounter (Signed)
The pt and is wife was both notified of the results. They both verbal understanding, without any questions.  He would like to proceed with the referral with Dr. Annette Stable [facility] for the colonoscopy.

## 2019-03-14 NOTE — Telephone Encounter (Signed)
Attempted to contact the pt, no answer. LMOM to return my call.  

## 2019-04-18 ENCOUNTER — Ambulatory Visit (INDEPENDENT_AMBULATORY_CARE_PROVIDER_SITE_OTHER): Payer: Medicare HMO | Admitting: Family Medicine

## 2019-04-18 ENCOUNTER — Other Ambulatory Visit: Payer: Self-pay | Admitting: Family Medicine

## 2019-04-18 ENCOUNTER — Other Ambulatory Visit: Payer: Self-pay

## 2019-04-18 ENCOUNTER — Encounter: Payer: Self-pay | Admitting: Family Medicine

## 2019-04-18 VITALS — BP 126/70 | HR 54 | Temp 97.9°F | Resp 16 | Ht 76.0 in | Wt 237.0 lb

## 2019-04-18 DIAGNOSIS — N1831 Chronic kidney disease, stage 3a: Secondary | ICD-10-CM

## 2019-04-18 DIAGNOSIS — I129 Hypertensive chronic kidney disease with stage 1 through stage 4 chronic kidney disease, or unspecified chronic kidney disease: Secondary | ICD-10-CM

## 2019-04-18 DIAGNOSIS — E1121 Type 2 diabetes mellitus with diabetic nephropathy: Secondary | ICD-10-CM | POA: Diagnosis not present

## 2019-04-18 DIAGNOSIS — Z23 Encounter for immunization: Secondary | ICD-10-CM

## 2019-04-18 DIAGNOSIS — E785 Hyperlipidemia, unspecified: Secondary | ICD-10-CM

## 2019-04-18 DIAGNOSIS — I10 Essential (primary) hypertension: Secondary | ICD-10-CM | POA: Diagnosis not present

## 2019-04-18 DIAGNOSIS — Z0000 Encounter for general adult medical examination without abnormal findings: Secondary | ICD-10-CM

## 2019-04-18 DIAGNOSIS — N183 Chronic kidney disease, stage 3 unspecified: Secondary | ICD-10-CM

## 2019-04-18 DIAGNOSIS — I480 Paroxysmal atrial fibrillation: Secondary | ICD-10-CM

## 2019-04-18 DIAGNOSIS — I25708 Atherosclerosis of coronary artery bypass graft(s), unspecified, with other forms of angina pectoris: Secondary | ICD-10-CM | POA: Diagnosis not present

## 2019-04-18 DIAGNOSIS — E663 Overweight: Secondary | ICD-10-CM

## 2019-04-18 DIAGNOSIS — R351 Nocturia: Secondary | ICD-10-CM

## 2019-04-18 DIAGNOSIS — E1169 Type 2 diabetes mellitus with other specified complication: Secondary | ICD-10-CM

## 2019-04-18 LAB — POCT GLYCOSYLATED HEMOGLOBIN (HGB A1C): Hemoglobin A1C: 6.7 % — AB (ref 4.0–5.6)

## 2019-04-18 NOTE — Assessment & Plan Note (Addendum)
Stable, without anginal symptoms Controlled on Imdur and med management Followed by Dr Saunders Revel [facility] Cardiology  Next due for apt cards 05/2019 - needs refills

## 2019-04-18 NOTE — Assessment & Plan Note (Addendum)
Secondary to DM, HTN, age With CKD-III On ACEi 

## 2019-04-18 NOTE — Assessment & Plan Note (Signed)
Controlled HTN Complicated by CAD, CKD-III    Plan:  1. Continue current BP regimen - Metoprolol 50mg BID, Lisiniopril 5mg daily, Isosorbide mononite 2. Encourage improved lifestyle - low sodium diet, regular exercise 3. Continue monitor BP outside office, bring readings to next visit, if persistently >140/90 or new symptoms notify office sooner 

## 2019-04-18 NOTE — Assessment & Plan Note (Signed)
Improved DM control, now A1c 6.7, improved lifestyle Complications - CKD-III, hyperlipidemia, CAD, obesity - increases risk of future cardiovascular complications   Plan:  1. Continue current Metformin 500mg  BID - hold on dose increase still, consider in future - Future consider injectable GLP1 meds, due to CKD limit other options 2. Encourage improved lifestyle - low carb, low sugar diet, reduce portion size, continue improving and start more regular exercise 3. Check CBG, bring log to next visit for review - check insurance for covered brand glucometer call back if need 4. Continue ASA, ACEi, Statin 5. Advised to schedule DM ophtho exam, send record - [place] DM Foot exam today  5 months for annual

## 2019-04-18 NOTE — Progress Notes (Signed)
Subjective:    Patient ID: [name] [name], male    DOB: Jan 07, 1951, 68 y.o.   MRN: [id number]  [name] [name] is a 68 y.o. male presenting on 04/18/2019 for Hypertension   HPI   Type 2 Diabetes, with Nephropathy CKD III - Last visit with me 01/2019, for same problem diabetes, treated with continued on Metformin and other lifestyle intervention, see prior notes for background information. - Today doing well, last A1c 6.8, now today A1c 6.7 - Other dietary changes, started on some green tea, to help regulate his bowels, no less constipation CBGs: Avg 100-120, low <100 rarely, without hypoglycemia Meds:Metformin 500mg  BID - tolerating well without side effects Currently on ACEi Lifestyle: Weight up since last visit but down overall in 1 year - Diet (improving diabetic diet reduced starch) - Exercise (improved exerciseoutdoors, yard work he is tolerating exertion even better,stationarybike) - Father with diabetes He does not have an eye doctor for DM eye exam - he will schedule with [employer] Denies hypoglycemia, visual changes, numbness or tingling, polyuria  CHRONIC HTN: Reports no new concerns Current Meds - Lisinopril 5mg  daily, Imdur 30mg  daily, Metoprolol 50mg  BID   Reports good compliance, took meds today. Tolerating well, w/o complaints. Denies CP, dyspnea, HA, edema, dizziness / lightheadedness  CAD Followed by [facility] Cardiology, next apt due 05/2018. On med management currently doing well.  Health Maintenance: He had recent cologuard positive, he has upcoming apt with GI specialist to discuss colonoscopy. He admits issue with tense bowel movements. He has taken stool softener. Improved bowel habits with green tea.  Due for Flu Shot, will receive today    Depression screen [facility] 2/9 04/18/2019 01/15/2019 07/04/2018  Decreased Interest 0 0 0  Down, Depressed, Hopeless 0 0 0  PHQ - 2 Score 0 0 0    Social History   Tobacco Use  . Smoking status: Never Smoker   . Smokeless tobacco: Never Used  Substance Use Topics  . Alcohol use: Not Currently  . Drug use: No    Review of Systems Per HPI unless specifically indicated above     Objective:    BP 126/70 (BP Location: Left Arm, Cuff Size: Normal)   Pulse (!) 54   Temp 97.9 F (36.6 C) (Oral)   Resp 16   Ht 6\' 4"  (1.93 m)   Wt 237 lb (107.5 kg)   BMI 28.85 kg/m   Wt Readings from Last 3 Encounters:  04/18/19 237 lb (107.5 kg)  01/15/19 226 lb (102.5 kg)  07/04/18 242 lb 12.8 oz (110.1 kg)    Physical Exam Vitals signs and nursing note reviewed.  Constitutional:      General: He is not in acute distress.    Appearance: He is well-developed. He is not diaphoretic.     Comments: Well-appearing, comfortable, cooperative  HENT:     Head: Normocephalic and atraumatic.  Eyes:     General:        Right eye: No discharge.        Left eye: No discharge.     Conjunctiva/sclera: Conjunctivae normal.  Neck:     Musculoskeletal: Normal range of motion and neck supple.     Thyroid: No thyromegaly.  Cardiovascular:     Rate and Rhythm: Normal rate and regular rhythm.     Heart sounds: Normal heart sounds. No murmur.  Pulmonary:     Effort: Pulmonary effort is normal. No respiratory distress.     Breath sounds: Normal breath  sounds. No wheezing or rales.  Musculoskeletal: Normal range of motion.     Comments: R>L varicose veins swollen, non tender no erythema   Lymphadenopathy:     Cervical: No cervical adenopathy.  Skin:    General: Skin is warm and dry.     Findings: No erythema or rash.  Neurological:     Mental Status: He is alert and oriented to person, place, and time.  Psychiatric:        Behavior: Behavior normal.     Comments: Well groomed, good eye contact, normal speech and thoughts      Diabetic Foot Exam - Simple   Simple Foot Form Diabetic Foot exam was performed with the following findings: Yes 04/18/2019  8:41 AM  Visual Inspection No deformities, no ulcerations,  no other skin breakdown bilaterally: Yes Sensation Testing Intact to touch and monofilament testing bilaterally: Yes Pulse Check Posterior Tibialis and Dorsalis pulse intact bilaterally: Yes Comments     Results for orders placed or performed in visit on 04/18/19  POCT glycosylated hemoglobin (Hb A1C)  Result Value Ref Range   Hemoglobin A1C 6.7 (A) 4.0 - 5.6 %      Assessment & Plan:   Problem List Items Addressed This Visit    Type 2 diabetes mellitus with diabetic nephropathy ([place]) - Primary    Improved DM control, now A1c 6.7, improved lifestyle Complications - CKD-III, hyperlipidemia, CAD, obesity - increases risk of future cardiovascular complications   Plan:  1. Continue current Metformin 500mg  BID - hold on dose increase still, consider in future - Future consider injectable GLP1 meds, due to CKD limit other options 2. Encourage improved lifestyle - low carb, low sugar diet, reduce portion size, continue improving and start more regular exercise 3. Check CBG, bring log to next visit for review - check insurance for covered brand glucometer call back if need 4. Continue ASA, ACEi, Statin 5. Advised to schedule DM ophtho exam, send record - [place] DM Foot exam today  5 months for annual      Relevant Orders   POCT glycosylated hemoglobin (Hb A1C) (Completed)   Essential hypertension (Chronic)    Controlled HTN Complicated by CAD, CKD-III    Plan:  1. Continue current BP regimen - Metoprolol 50mg  BID, Lisiniopril 5mg  daily, Isosorbide mononite 2. Encourage improved lifestyle - low sodium diet, regular exercise 3. Continue monitor BP outside office, bring readings to next visit, if persistently >140/90 or new symptoms notify office sooner      Coronary artery disease of bypass graft of native heart with stable angina pectoris (HCC) (Chronic)    Stable, without anginal symptoms Controlled on Imdur and med management Followed by Dr Saunders Revel [facility] Cardiology  Next due  for apt cards 05/2019 - needs refills      CKD (chronic kidney disease), stage III    Secondary to DM, HTN, age With CKD-III On ACEi       Other Visit Diagnoses    Needs flu shot       Relevant Orders   Flu Vaccine QUAD High Dose(Fluad) (Completed)      No orders of the defined types were placed in this encounter.   Follow up plan: Return in about 5 months (around 09/16/2019) for Annual Physical.  Future labs ordered for 09/12/19   Nobie Putnam, [facility] 04/18/2019, 8:31 AM

## 2019-04-18 NOTE — Patient Instructions (Addendum)
Thank you for coming to the office today.  Recent Labs    10/06/18 0837 01/08/19 0852 04/18/19 0835  HGBA1C 7.3* 6.8* 6.7*   Great job on the sugar. Keep up the good work.  BP improved on recheck  Flu shot today, high dose. Takes 2 weeks for immunity  Call heart doctors to follow-up rx running out in 05/2019  DUE for FASTING BLOOD WORK (no food or drink after midnight before the lab appointment, only water or coffee without cream/sugar on the morning of)  SCHEDULE "Lab Only" visit in the morning at the clinic for lab draw in 5 MONTHS   - Make sure Lab Only appointment is at about 1 week before your next appointment, so that results will be available  For Lab Results, once available within 2-3 days of blood draw, you can can log in to MyChart online to view your results and a brief explanation. Also, we can discuss results at next follow-up visit.   Please schedule a Follow-up Appointment to: Return in about 5 months (around 09/16/2019) for Annual Physical.  If you have any other questions or concerns, please feel free to call the office or send a message through [place]. You may also schedule an earlier appointment if necessary.  Additionally, you may be receiving a survey about your experience at our office within a few days to 1 week by e-mail or mail. We value your feedback.  Nobie Putnam, DO [place]

## 2019-04-25 DIAGNOSIS — R195 Other fecal abnormalities: Secondary | ICD-10-CM | POA: Diagnosis not present

## 2019-04-27 ENCOUNTER — Telehealth: Payer: Self-pay | Admitting: Internal Medicine

## 2019-04-27 NOTE — Telephone Encounter (Signed)
Pt takes Eliquis for afib with CHADS2VASc score of (age, HTN, CAD, DM). SCr 1.29, CrCl 75. Ok to hold Eliquis for 1 day prior to procedure as requested.

## 2019-04-27 NOTE — Telephone Encounter (Signed)
   Primary Cardiologist: Nelva Bush, MD  Chart reviewed as part of pre-operative protocol coverage. Patient was contacted 04/27/2019 in reference to pre-operative risk assessment for pending surgery as outlined below.  [name] [name] was last seen on 05/17/18 by Dr. Saunders Revel. History of CAD s/p CABG, paroxysmal atrial fibrillation, hypertension, hyperlipidemia, and type 2 diabetes mellitus. Prior EF 50-55%. Will route to pharm for input on Eliquis then patient will need call.  Charlie Pitter, PA-C 04/27/2019, 11:41 AM

## 2019-04-27 NOTE — Telephone Encounter (Signed)
° °  [facility] Pre-operative Risk Assessment    Request for surgical clearance:  1. What type of surgery is being performed?  colonoscopy  2. When is this surgery scheduled? 05/24/19   3. What type of clearance is required (medical clearance vs. Pharmacy clearance to hold med vs. Both)? both  4. Are there any medications that need to be held prior to surgery and how long? Hold Eliquis night before and morning of procedure   5. Practice name and name of physician performing surgery? [facility], Dr Cristina Gong   6. What is your office phone number [phone]   7.   What is your office fax number [phone]  8.   Anesthesia type (None, local, MAC, general) ? Not listed    [name] [name] 04/27/2019, 9:08 AM  _________________________________________________________________   (provider comments below)

## 2019-04-27 NOTE — Telephone Encounter (Signed)
Reached out to pt and got VM. LMOM to call us back.

## 2019-04-30 NOTE — Telephone Encounter (Signed)
Called patient again but got voicemail. Left message to call back.

## 2019-05-01 NOTE — Telephone Encounter (Signed)
Attempted to reach patient once again with no answer, no voicemail availability-05/01/2019

## 2019-05-03 NOTE — Telephone Encounter (Signed)
Left message for the patient to call back and speak to the on-call preop APP of the day 

## 2019-05-03 NOTE — Telephone Encounter (Signed)
Spoke with the patient's wife, plan to call patient tomorrow around 7:30AM for cardiac clearance. He is not available at this time.

## 2019-05-04 NOTE — Telephone Encounter (Signed)
   Primary Cardiologist: Nelva Bush, MD  Chart reviewed as part of pre-operative protocol coverage. Patient was contacted 05/04/2019 in reference to pre-operative risk assessment for pending surgery as outlined below.  Nathinel Heckman was last seen on 05/17/2018 by Dr. Saunders Revel.  Since that day, Skylen Debord has done well without chest pain or shortness of breath.  Therefore, based on ACC/AHA guidelines, the patient would be at acceptable risk for the planned procedure without further cardiovascular testing.   I will route this recommendation to the requesting party via Epic fax function and remove from pre-op pool.  Please call with questions. He may hold eliquis for 1 day prior to the procedure and restart as soon as possible afterward at the discretion of the surgeon based on bleeding risk.   [place], [place] 05/04/2019, 7:32 AM

## 2019-05-21 DIAGNOSIS — Z1159 Encounter for screening for other viral diseases: Secondary | ICD-10-CM | POA: Diagnosis not present

## 2019-05-24 DIAGNOSIS — R195 Other fecal abnormalities: Secondary | ICD-10-CM | POA: Diagnosis not present

## 2019-05-24 DIAGNOSIS — D120 Benign neoplasm of cecum: Secondary | ICD-10-CM | POA: Diagnosis not present

## 2019-05-24 DIAGNOSIS — D122 Benign neoplasm of ascending colon: Secondary | ICD-10-CM | POA: Diagnosis not present

## 2019-05-24 LAB — HM COLONOSCOPY

## 2019-05-29 ENCOUNTER — Other Ambulatory Visit: Payer: Self-pay

## 2019-05-29 ENCOUNTER — Telehealth: Payer: Self-pay

## 2019-05-29 DIAGNOSIS — D122 Benign neoplasm of ascending colon: Secondary | ICD-10-CM | POA: Diagnosis not present

## 2019-05-29 DIAGNOSIS — D120 Benign neoplasm of cecum: Secondary | ICD-10-CM | POA: Diagnosis not present

## 2019-05-29 DIAGNOSIS — I480 Paroxysmal atrial fibrillation: Secondary | ICD-10-CM

## 2019-05-29 DIAGNOSIS — I10 Essential (primary) hypertension: Secondary | ICD-10-CM

## 2019-05-29 MED ORDER — LISINOPRIL 5 MG PO TABS: 5.0000 mg | ORAL_TABLET | Freq: Every day | ORAL | 0 refills | Status: DC

## 2019-05-29 MED ORDER — APIXABAN 5 MG PO TABS: 5.0000 mg | ORAL_TABLET | Freq: Two times a day (BID) | ORAL | 3 refills | Status: DC

## 2019-05-29 MED ORDER — ATORVASTATIN CALCIUM 80 MG PO TABS: ORAL_TABLET | ORAL | 0 refills | Status: DC

## 2019-05-29 NOTE — Telephone Encounter (Signed)
Requested Prescriptions   Signed Prescriptions Disp Refills  . lisinopril (ZESTRIL) 5 MG tablet 90 tablet 0    Sig: Take 1 tablet (5 mg total) by mouth daily.    Authorizing Provider: END, CHRISTOPHER    Ordering User: NEWCOMER MCCLAIN, Diamon Reddinger L  . atorvastatin (LIPITOR) 80 MG tablet 90 tablet 0    Sig: TAKE 1 TABLET BY MOUTH AT 6PM AS DIRECTED    Authorizing Provider: END, CHRISTOPHER    Ordering User: NEWCOMER MCCLAIN, Liyana Suniga L

## 2019-05-29 NOTE — Telephone Encounter (Signed)
30m 107.5kg Scr 1.29 01/08/19 Lovw/end 05/17/18

## 2019-05-29 NOTE — Telephone Encounter (Signed)
*  STAT* If patient is at the pharmacy, call can be transferred to refill team.   1. Which medications need to be refilled? (please list name of each medication and dose if known) Eliquis  2. Which pharmacy/location (including street and city if local pharmacy) is medication to be sent to? [id number] [address], [place]  3. Do they need a 30 day or 90 day supply? [place]

## 2019-06-14 ENCOUNTER — Other Ambulatory Visit: Payer: Self-pay

## 2019-06-14 ENCOUNTER — Encounter: Payer: Self-pay | Admitting: Internal Medicine

## 2019-06-14 ENCOUNTER — Ambulatory Visit: Payer: Medicare HMO | Admitting: Internal Medicine

## 2019-06-14 ENCOUNTER — Other Ambulatory Visit
Admission: RE | Admit: 2019-06-14 | Discharge: 2019-06-14 | Disposition: A | Discharge status: Home / Self Care | Payer: Medicare HMO | Source: Ambulatory Visit | Attending: Internal Medicine | Admitting: Internal Medicine

## 2019-06-14 VITALS — BP 152/74 | HR 57 | Ht 76.0 in | Wt 235.5 lb

## 2019-06-14 DIAGNOSIS — I480 Paroxysmal atrial fibrillation: Secondary | ICD-10-CM | POA: Diagnosis not present

## 2019-06-14 DIAGNOSIS — I10 Essential (primary) hypertension: Secondary | ICD-10-CM

## 2019-06-14 DIAGNOSIS — I25708 Atherosclerosis of coronary artery bypass graft(s), unspecified, with other forms of angina pectoris: Secondary | ICD-10-CM | POA: Diagnosis not present

## 2019-06-14 DIAGNOSIS — E785 Hyperlipidemia, unspecified: Secondary | ICD-10-CM

## 2019-06-14 LAB — BASIC METABOLIC PANEL
Anion gap: 8 (ref 5–15)
BUN: 22 mg/dL (ref 8–23)
CO2: 26 mmol/L (ref 22–32)
Calcium: 10.1 mg/dL (ref 8.9–10.3)
Chloride: 104 mmol/L (ref 98–111)
Creatinine, Ser: 1.09 mg/dL (ref 0.61–1.24)
GFR calc Af Amer: >60 mL/min (ref >60)
GFR calc non Af Amer: >60 mL/min (ref >60)
Glucose, Bld: 142 mg/dL — ABNORMAL HIGH (ref 70–99)
Potassium: 4.1 mmol/L (ref 3.5–5.1)
Sodium: 138 mmol/L (ref 135–145)

## 2019-06-14 MED ORDER — ATORVASTATIN CALCIUM 80 MG PO TABS: ORAL_TABLET | ORAL | 3 refills | Status: DC

## 2019-06-14 MED ORDER — NITROGLYCERIN 0.4 MG SL SUBL: 0.4000 mg | SUBLINGUAL_TABLET | SUBLINGUAL | 3 refills | Status: AC | PRN

## 2019-06-14 MED ORDER — LISINOPRIL 10 MG PO TABS: 10.0000 mg | ORAL_TABLET | Freq: Every day | ORAL | 3 refills | Status: DC

## 2019-06-14 MED ORDER — METOPROLOL TARTRATE 50 MG PO TABS: 50.0000 mg | ORAL_TABLET | Freq: Two times a day (BID) | ORAL | 3 refills | Status: DC

## 2019-06-14 MED ORDER — ISOSORBIDE MONONITRATE ER 30 MG PO TB24: 30.0000 mg | ORAL_TABLET | Freq: Every day | ORAL | 3 refills | Status: DC

## 2019-06-14 NOTE — Patient Instructions (Signed)
Medication Instructions:  Your physician has recommended you make the following change in your medication:  1- INCREASE Lisinopril to 10 mg by mouth once a day.  *If you need a refill on your cardiac medications before your next appointment, please call your pharmacy*  Lab Work: 1- Your physician recommends that you return for lab work in: [place] - on your way out of the [facility].  2-  Your physician recommends that you return for lab work in: 2 weeks around June 28, 2019. - BMET. - Please go to the [place]. You will check in at the front desk to the right as you walk into the atrium. Valet Parking is offered if needed. - No appointment needed. You may go any day between 7 am and 6 pm.  If you have labs (blood work) drawn today and your tests are completely normal, you will receive your results only by: Marland Kitchen MyChart Message (if you have MyChart) OR . A paper copy in the mail If you have any lab test that is abnormal or we need to change your treatment, we will call you to review the results.  Testing/Procedures: none  Follow-Up: At [facility], you and your health needs are our priority.  As part of our continuing mission to provide you with exceptional heart care, we have created designated Provider Care Teams.  These Care Teams include your primary Cardiologist (physician) and Advanced Practice Providers (APPs -  Physician Assistants and Nurse Practitioners) who all work together to provide you with the care you need, when you need it.  Your next appointment:   12 month(s)  The format for your next appointment:   In Person  Provider:    You may see Nelva Bush, MD or one of the following Advanced Practice Providers on your designated Care Team:    Murray Hodgkins, NP  Christell Faith, PA-C  Marrianne Mood, PA-C

## 2019-06-14 NOTE — Progress Notes (Signed)
Follow-up Outpatient Visit Date: 06/14/2019  Primary Care Provider: Olin Hauser, DO [address] [address]  Chief Complaint: Follow-up CAD and a-fib  HPI:  [name] [name] is a 68 y.o. male with history of coronary artery disease status post CABG, paroxysmal atrial fibrillation, hypertension, hyperlipidemia, and type 2 diabetes mellitus, who presents for follow-up of coronary artery disease and atrial fibrillation.  I last saw him in 05/2018, at which time he was doing well.  He noted that his chronic exertional dyspnea was less pronounced than at our previous visit.  He complained of chronic intermittent abdominal discomfort associated with constipation, which she was planning to discuss further with his PCP.  No medication changes or additional testing were pursued at that time.  Today, [name] [name] reports feeling well.  He has not had any chest pain.  His chronic exertional dyspnea is stable.  His weight has increased 11 pounds since 01/2019, though [name] [name] denies edema, orthopnea, and PND.  He does not check his blood pressure at home.  --------------------------------------------------------------------------------------------------  Past Medical/Surgical History: 1. HTN 2. Type II DM 3. Hyperlipidemia 4. CAD: Unstable angina 11/13 with LHC showing severe 3 vessel disease. He had CABG [name]) with LIMA-LAD, SVG-D2, seq SVG-PLV/PDA, SVG-OM. Echo (11/13) with EF 60%, mild LAE. Abnormal stress test in 7/15 with LHC showing occluded SVG-RCA, occluded SVG-D, occluded SVG-OM, and patent LIMA-LAD. Medical management.  5.Paroxysmal atrial fibrillation  Recent CV Pertinent Labs: Lab Results  Component Value Date   CHOL 109 06/27/2018   CHOL 121 07/06/2017   HDL 34 (L) 06/27/2018   HDL 37 (L) 07/06/2017   LDLCALC 58 06/27/2018   TRIG 89 06/27/2018   CHOLHDL 3.2 06/27/2018   INR 1.10 12/18/2013   K 4.1 06/14/2019   MG 2.0 07/06/2017   BUN 22 06/14/2019   BUN 16 08/08/2017   CREATININE 1.09 06/14/2019   CREATININE 1.29 (H) 01/08/2019    Past medical and surgical history were reviewed and updated in EPIC.  Current Meds  Medication Sig  . acetaminophen (TYLENOL) 325 MG tablet Take 650 mg by mouth every 6 (six) hours as needed for headache.  Marland Kitchen apixaban (ELIQUIS) 5 MG TABS tablet Take 1 tablet (5 mg total) by mouth 2 (two) times daily.  Marland Kitchen aspirin EC 81 MG tablet Take 81 mg by mouth daily.  Marland Kitchen atorvastatin (LIPITOR) 80 MG tablet TAKE 1 TABLET BY MOUTH AT 6PM AS DIRECTED  . Coenzyme Q10 (CO Q 10) 100 MG CAPS Take 200 mg by mouth daily.  . fish oil-omega-3 fatty acids 1000 MG capsule Take 1 g by mouth daily.  . isosorbide mononitrate (IMDUR) 30 MG 24 hr tablet Take 1 tablet (30 mg total) by mouth daily.  . metFORMIN (GLUCOPHAGE) 500 MG tablet TAKE 1 TABLET (500 MG TOTAL) BY MOUTH 2 (TWO) TIMES DAILY WITH A MEAL.  . nitroGLYCERIN (NITROSTAT) 0.4 MG SL tablet Place 1 tablet (0.4 mg total) under the tongue every 5 (five) minutes as needed for chest pain.  . [DISCONTINUED] atorvastatin (LIPITOR) 80 MG tablet TAKE 1 TABLET BY MOUTH AT 6PM AS DIRECTED  . [DISCONTINUED] isosorbide mononitrate (IMDUR) 30 MG 24 hr tablet Take 1 tablet (30 mg total) by mouth daily.  . [DISCONTINUED] lisinopril (ZESTRIL) 5 MG tablet Take 1 tablet (5 mg total) by mouth daily.  . [DISCONTINUED] nitroGLYCERIN (NITROSTAT) 0.4 MG SL tablet Place 1 tablet (0.4 mg total) under the tongue every 5 (five) minutes as needed for chest pain.  Allergies: Penicillins  Social History   Tobacco Use  . Smoking status: Never Smoker  . Smokeless tobacco: Never Used  Substance Use Topics  . Alcohol use: Not Currently  . Drug use: No    Family History  Problem Relation Age of Onset  . CAD Mother   . Heart attack Mother   . CAD Father   . Diabetes Father   . Heart attack Father   . Colon cancer Neg Hx   . Prostate cancer Neg Hx     Review of Systems: A 12-system review of  systems was performed and was negative except as noted in the HPI.  --------------------------------------------------------------------------------------------------  Physical Exam: BP (!) 152/74 (BP Location: Left Arm, Patient Position: Sitting, Cuff Size: Normal)   Pulse (!) 57   Ht 6\' 4"  (1.93 m)   Wt 235 lb 8 oz (106.8 kg)   SpO2 98%   BMI 28.67 kg/m   General:  NAD. HEENT: No conjunctival pallor or scleral icterus. Facemask in place. Neck: Supple without lymphadenopathy, thyromegaly, JVD, or HJR. Lungs: Normal work of breathing. Clear to auscultation bilaterally without wheezes or crackles. Heart: Bradycardic but regular without murmurs, rubs, or gallops. Non-displaced PMI. Abd: Bowel sounds present. Soft, NT/ND without hepatosplenomegaly Ext: Trace pretibial edema bilaterally. Radial, PT, and DP pulses are 2+ bilaterally. Skin: Warm and dry without rash.  EKG:  Sinus bradycardia with LAFB and LVH.  Lab Results  Component Value Date   WBC 8.3 06/27/2018   HGB 14.8 06/27/2018   HCT 43.8 06/27/2018   MCV 89.6 06/27/2018   PLT 248 06/27/2018    Lab Results  Component Value Date   NA 138 06/14/2019   K 4.1 06/14/2019   CL 104 06/14/2019   CO2 26 06/14/2019   BUN 22 06/14/2019   CREATININE 1.09 06/14/2019   GLUCOSE 142 (H) 06/14/2019   ALT 31 06/27/2018    Lab Results  Component Value Date   CHOL 109 06/27/2018   HDL 34 (L) 06/27/2018   LDLCALC 58 06/27/2018   TRIG 89 06/27/2018   CHOLHDL 3.2 06/27/2018    --------------------------------------------------------------------------------------------------  ASSESSMENT AND PLAN: CAD with stable angina: No chest pain reported.  DOE is stable and likely multifactorial.  Continue current antianginal regimen consisting of Imdur 30 mg dialy and metoprolol 50 mg BID.  Patient is tolerating aspirin and apixaban well.  PAF: No symptoms to suggest recurrence.  EKG today again shows sinus bradycardia.  Continue  apixaban and metoprolol.  Patient is due for routine labs (CMP and CBC) before his annual visit with his PCP in March.  Hypertension: BP not well-controlled today.  We will increase lisinopril to 10 mg daily, with BMP today and again in ~2 weeks.  Further medication adjustments may need to be made when he sees Kr. Parks Ranger in March if BP remains > 130/80.  Sodium restriction and weight loss through diet and exercise were encouraged.  Hyperlipidemia: LDL at goal on last check a year ago.  Lipid panel to be check with upcoming labs with Dr. Parks Ranger.  Continue atorvastatin 80 mg daily and fish oil.  Follow-up: Return to clinic in 1 year.  Nelva Bush, MD 06/15/2019 10:34 AM

## 2019-06-15 ENCOUNTER — Encounter: Payer: Self-pay | Admitting: Internal Medicine

## 2019-06-15 DIAGNOSIS — I10 Essential (primary) hypertension: Secondary | ICD-10-CM | POA: Insufficient documentation

## 2019-06-24 ENCOUNTER — Other Ambulatory Visit: Payer: Self-pay | Admitting: Family Medicine

## 2019-06-24 DIAGNOSIS — E1121 Type 2 diabetes mellitus with diabetic nephropathy: Secondary | ICD-10-CM

## 2019-06-26 ENCOUNTER — Telehealth: Payer: Self-pay | Admitting: Internal Medicine

## 2019-06-26 NOTE — Telephone Encounter (Signed)
I spoke with the patient's wife, Izora Gala (ok per DPR). The patient went yesterday for a BP check at [facility]. BP was checked around noon and I confirmed with the patient's wife, the patient is currently taking lisinopril 10 mg once daily in the morning. BP yesterday was 125/70.  I advised the patient's wife his reading looks really good, but what he needed to come in for this week was a repeat BMP level. Per the patient's wife, they had misunderstood. The patient is fearful of coming in to the hospital environment with [place]. I advised we could send orders to a [facility] draw station if he would feel better about this. Per Izora Gala, the patient will just come in to [place] to have this done. I advised the he come around 7-7:30 am when less people are in the [facility]. She is agreeable.   I have advised I will forward BP results to Dr. Saunders Revel for review.

## 2019-06-26 NOTE — Telephone Encounter (Signed)
BP looks good.  I agree with coming in for BMP as outlined below.  Thanks.  Nelva Bush, MD [facility]

## 2019-06-26 NOTE — Telephone Encounter (Signed)
Patient wife called and states patient is not comfortable coming in to get his BP checked, States pt went to EMS at [facility] and it was 125/70. Please call to discuss

## 2019-06-28 ENCOUNTER — Other Ambulatory Visit: Payer: Self-pay

## 2019-06-28 ENCOUNTER — Other Ambulatory Visit
Admission: RE | Admit: 2019-06-28 | Discharge: 2019-06-28 | Disposition: A | Discharge status: Home / Self Care | Payer: Medicare HMO | Attending: Internal Medicine | Admitting: Internal Medicine

## 2019-06-28 DIAGNOSIS — I10 Essential (primary) hypertension: Secondary | ICD-10-CM | POA: Diagnosis not present

## 2019-06-28 DIAGNOSIS — I480 Paroxysmal atrial fibrillation: Secondary | ICD-10-CM | POA: Insufficient documentation

## 2019-06-28 LAB — BASIC METABOLIC PANEL
Anion gap: 9 (ref 5–15)
BUN: 20 mg/dL (ref 8–23)
CO2: 28 mmol/L (ref 22–32)
Calcium: 9.6 mg/dL (ref 8.9–10.3)
Chloride: 103 mmol/L (ref 98–111)
Creatinine, Ser: 1.35 mg/dL — ABNORMAL HIGH (ref 0.61–1.24)
GFR calc Af Amer: >60 mL/min (ref >60)
GFR calc non Af Amer: 54 mL/min — ABNORMAL LOW (ref >60)
Glucose, Bld: 156 mg/dL — ABNORMAL HIGH (ref 70–99)
Potassium: 4.5 mmol/L (ref 3.5–5.1)
Sodium: 140 mmol/L (ref 135–145)

## 2019-07-05 ENCOUNTER — Encounter: Payer: Self-pay | Admitting: Family Medicine

## 2019-08-21 ENCOUNTER — Other Ambulatory Visit: Payer: Self-pay | Admitting: Internal Medicine

## 2019-08-21 DIAGNOSIS — I10 Essential (primary) hypertension: Secondary | ICD-10-CM

## 2019-08-21 DIAGNOSIS — I480 Paroxysmal atrial fibrillation: Secondary | ICD-10-CM

## 2019-09-12 ENCOUNTER — Other Ambulatory Visit: Payer: Medicare HMO

## 2019-09-12 DIAGNOSIS — E1121 Type 2 diabetes mellitus with diabetic nephropathy: Secondary | ICD-10-CM | POA: Diagnosis not present

## 2019-09-12 DIAGNOSIS — I25708 Atherosclerosis of coronary artery bypass graft(s), unspecified, with other forms of angina pectoris: Secondary | ICD-10-CM | POA: Diagnosis not present

## 2019-09-12 DIAGNOSIS — E1169 Type 2 diabetes mellitus with other specified complication: Secondary | ICD-10-CM | POA: Diagnosis not present

## 2019-09-12 DIAGNOSIS — I129 Hypertensive chronic kidney disease with stage 1 through stage 4 chronic kidney disease, or unspecified chronic kidney disease: Secondary | ICD-10-CM | POA: Diagnosis not present

## 2019-09-12 DIAGNOSIS — R351 Nocturia: Secondary | ICD-10-CM | POA: Diagnosis not present

## 2019-09-12 DIAGNOSIS — N1831 Chronic kidney disease, stage 3a: Secondary | ICD-10-CM | POA: Diagnosis not present

## 2019-09-12 DIAGNOSIS — N183 Chronic kidney disease, stage 3 unspecified: Secondary | ICD-10-CM | POA: Diagnosis not present

## 2019-09-12 DIAGNOSIS — I480 Paroxysmal atrial fibrillation: Secondary | ICD-10-CM | POA: Diagnosis not present

## 2019-09-12 DIAGNOSIS — E785 Hyperlipidemia, unspecified: Secondary | ICD-10-CM | POA: Diagnosis not present

## 2019-09-12 DIAGNOSIS — Z0000 Encounter for general adult medical examination without abnormal findings: Secondary | ICD-10-CM | POA: Diagnosis not present

## 2019-09-13 LAB — CBC WITH DIFFERENTIAL/PLATELET
Absolute Monocytes: 657 cells/uL (ref 200–950)
Basophils Absolute: 39 cells/uL (ref 0–200)
Basophils Relative: 0.6 %
Eosinophils Absolute: 189 cells/uL (ref 15–500)
Eosinophils Relative: 2.9 %
HCT: 45.3 % (ref 38.5–50.0)
Hemoglobin: 15 g/dL (ref 13.2–17.1)
Lymphs Abs: 2132 cells/uL (ref 850–3900)
MCH: 30.2 pg (ref 27.0–33.0)
MCHC: 33.1 g/dL (ref 32.0–36.0)
MCV: 91.1 fL (ref 80.0–100.0)
MPV: 12.3 fL (ref 7.5–12.5)
Monocytes Relative: 10.1 %
Neutro Abs: 3484 cells/uL (ref 1500–7800)
Neutrophils Relative %: 53.6 %
Platelets: 212 10*3/uL (ref 140–400)
RBC: 4.97 10*6/uL (ref 4.20–5.80)
RDW: 13 % (ref 11.0–15.0)
Total Lymphocyte: 32.8 %
WBC: 6.5 10*3/uL (ref 3.8–10.8)

## 2019-09-13 LAB — COMPLETE METABOLIC PANEL WITH GFR
AG Ratio: 1.7 (calc) (ref 1.0–2.5)
ALT: 30 U/L (ref 9–46)
AST: 33 U/L (ref 10–35)
Albumin: 4.6 g/dL (ref 3.6–5.1)
Alkaline phosphatase (APISO): 80 U/L (ref 35–144)
BUN/Creatinine Ratio: 16 (calc) (ref 6–22)
BUN: 22 mg/dL (ref 7–25)
CO2: 31 mmol/L (ref 20–32)
Calcium: 10.5 mg/dL — ABNORMAL HIGH (ref 8.6–10.3)
Chloride: 101 mmol/L (ref 98–110)
Creat: 1.39 mg/dL — ABNORMAL HIGH (ref 0.70–1.25)
GFR, Est African American: 60 mL/min/{1.73_m2} (ref >=60)
GFR, Est Non African American: 52 mL/min/{1.73_m2} — ABNORMAL LOW (ref >=60)
Globulin: 2.7 g/dL (calc) (ref 1.9–3.7)
Glucose, Bld: 160 mg/dL — ABNORMAL HIGH (ref 65–99)
Potassium: 5 mmol/L (ref 3.5–5.3)
Sodium: 140 mmol/L (ref 135–146)
Total Bilirubin: 0.9 mg/dL (ref 0.2–1.2)
Total Protein: 7.3 g/dL (ref 6.1–8.1)

## 2019-09-13 LAB — LIPID PANEL
Cholesterol: 116 mg/dL (ref <200)
HDL: 44 mg/dL (ref >=40)
LDL Cholesterol (Calc): 60 mg/dL (calc)
Non-HDL Cholesterol (Calc): 72 mg/dL (calc) (ref <130)
Total CHOL/HDL Ratio: 2.6 (calc) (ref <5.0)
Triglycerides: 50 mg/dL (ref <150)

## 2019-09-13 LAB — PSA: PSA: 1.1 ng/mL (ref <=4.0)

## 2019-09-13 LAB — HEMOGLOBIN A1C
Hgb A1c MFr Bld: 7.4 % of total Hgb — ABNORMAL HIGH (ref <5.7)
Mean Plasma Glucose: 166 (calc)
eAG (mmol/L): 9.2 (calc)

## 2019-09-18 ENCOUNTER — Encounter: Payer: Self-pay | Admitting: Family Medicine

## 2019-09-18 ENCOUNTER — Ambulatory Visit (INDEPENDENT_AMBULATORY_CARE_PROVIDER_SITE_OTHER): Payer: Medicare HMO | Admitting: Family Medicine

## 2019-09-18 ENCOUNTER — Other Ambulatory Visit: Payer: Self-pay

## 2019-09-18 VITALS — BP 124/70 | HR 52 | Temp 97.1°F | Resp 16 | Ht 76.0 in | Wt 239.0 lb

## 2019-09-18 DIAGNOSIS — Z0000 Encounter for general adult medical examination without abnormal findings: Secondary | ICD-10-CM | POA: Diagnosis not present

## 2019-09-18 DIAGNOSIS — Z23 Encounter for immunization: Secondary | ICD-10-CM

## 2019-09-18 DIAGNOSIS — N183 Chronic kidney disease, stage 3 unspecified: Secondary | ICD-10-CM

## 2019-09-18 DIAGNOSIS — E1169 Type 2 diabetes mellitus with other specified complication: Secondary | ICD-10-CM | POA: Diagnosis not present

## 2019-09-18 DIAGNOSIS — I25708 Atherosclerosis of coronary artery bypass graft(s), unspecified, with other forms of angina pectoris: Secondary | ICD-10-CM

## 2019-09-18 DIAGNOSIS — E663 Overweight: Secondary | ICD-10-CM | POA: Diagnosis not present

## 2019-09-18 DIAGNOSIS — E1121 Type 2 diabetes mellitus with diabetic nephropathy: Secondary | ICD-10-CM | POA: Diagnosis not present

## 2019-09-18 DIAGNOSIS — I129 Hypertensive chronic kidney disease with stage 1 through stage 4 chronic kidney disease, or unspecified chronic kidney disease: Secondary | ICD-10-CM

## 2019-09-18 DIAGNOSIS — I480 Paroxysmal atrial fibrillation: Secondary | ICD-10-CM | POA: Diagnosis not present

## 2019-09-18 DIAGNOSIS — N1831 Chronic kidney disease, stage 3a: Secondary | ICD-10-CM

## 2019-09-18 DIAGNOSIS — E785 Hyperlipidemia, unspecified: Secondary | ICD-10-CM

## 2019-09-18 NOTE — Patient Instructions (Addendum)
Thank you for coming to the office today.  Please call and schedule for Diabetic Eye Exam when you can this year in 2021. Goal to get this done in Summer or earlier. Have them fax [place] a copy of your diabetic eye report.  [facility]   Address: [address] [place], [place] [address] Phone: [phone]  Website: [email or web address]    When ready I do recommend COVID vaccine  Wait 2 weeks after this vaccine today for covid19 vaccine  Perhaps - [employer] vaccine 1 dose.  Call [facility] to setup / schedule  or  [place]:  [facility]) [place] [address]  Hours: Monday - Sunday 8:00am to 12:00pm  COVID-19 Vaccines By Appointment Only  Sign up for [place] List  [email or web address] or text "[email or web address]" to [phone] or call [phone]   Please schedule a Follow-up Appointment to: Return in about 6 months (around 03/19/2020) for 6 month DM A1c HTN.  If you have any other questions or concerns, please feel free to call the office or send a message through [place]. You may also schedule an earlier appointment if necessary.  Additionally, you may be receiving a survey about your experience at our office within a few days to 1 week by e-mail or mail. We value your feedback.  Nobie Putnam, DO [place]

## 2019-09-18 NOTE — Assessment & Plan Note (Signed)
Well controlled cholesterol on statin  Last lipid panel 08/2019 Known CAD CABG  Plan: 1. Continue current meds - Atorvastatin 80mg  daily, Fish Oil omega 3 2. Continue ASA 81mg  for secondary ASCVD risk reduction 3. Encourage improved lifestyle - low carb/cholesterol, reduce portion size, continue improving regular exercise

## 2019-09-18 NOTE — Assessment & Plan Note (Signed)
Weight stable to slightly increased in past 4-5 months Encourage lifestyle diet exercise Now warmer weather inc exercise

## 2019-09-18 NOTE — Progress Notes (Signed)
Subjective:    Patient ID: [name] [name], male    DOB: 1951-04-11, 69 y.o.   MRN: [id number]  [name] [name] is a 69 y.o. male presenting on 09/18/2019 for Annual Exam   HPI   Here for Annual Physical and Lab Review.  Type 2 Diabetes, with Nephropathy CKD III - Last visit with me11/2020, for same problemdiabetes, treated withcontinued on Metformin and other lifestyle intervention, see prior notes for background information. - Today doing well but has some concerns with elevated sugar readings, prior A1c 6s last 6.7, now recent lab 3/31 showed 7.4 CBGs: Avg 100-150, low<100 rarely, without hypoglycemia Meds:Metformin 500mg  BID - tolerating well without side effects Currently on ACEi Lifestyle: Weight up 2 lbs in 6 months, fluctuated weight - Diet (improving diabetic diet) - Exercise (improved exerciseoutdoors, yard workhe is tolerating exertion even better,stationarybike) - Father with diabetes He does not have an eye doctor for DM eye exam - he will schedule with [facility] again asked Denies hypoglycemia, visual changes, numbness or tingling, polyuria  CHRONIC HTN: Reportsno new concerns. Last visit with Cardiology they increased Lisinopril from 5 to 10mg  Current Meds -Lisinopril 10mg  daily, Imdur 30mg  daily, Metoprolol 50mg  BID Reports good compliance, took meds today. Tolerating well, w/o complaints. Denies CP, dyspnea, HA, edema, dizziness / lightheadedness  CAD s/p CABG 2013 LHC with severe 3 vessel disease/ PAF History of hospitalization from yellow jacket stings and ultimately unstable angina led to [facility] and diagnosis, no prior issue of heart or heart damage. Followed by Cardiology since that time, has been on med management, and doing well - Taking Eliquis 5mg  BID, high intensity statin, ASA, omega 3 fish oil, imdur, ACEi, BB - has NTG SL. Not using it.  HYPERLIPIDEMIA: - Reports no concerns. Last lipid panel 08/2019, controlled  - Currently  taking Atorvastatin 80mg  daily, tolerating well without side effects or myalgias  Health Maintenance: [facility] - Dr Cristina Gong - Colonoscopy 05/2019 - tubular adenoma x 3 - benign, next repeat in 3 years 05/2022  Due for 2nd pneumonia vaccine >1 year after previous vaccine, now to receive Pneumovax-23 today  Has not had COVID19 vaccine yet. Asking about it.  Depression screen [facility] 2/9 04/18/2019 01/15/2019 07/04/2018  Decreased Interest 0 0 0  Down, Depressed, Hopeless 0 0 0  PHQ - 2 Score 0 0 0    Past Medical History:  Diagnosis Date  . Allergy   . Coronary artery disease 2013   a. [place] 2013 w/ severe 3-V CAD; b. s/p 4-V CABG in 2013 (LIMA-LAD, VG-D2, seq VG-PLA/PDA, VG-OM; c. LHC 2015 patent LIMA-LAD, occluded VGs, med Rx  . Headache(784.0)   . PAF (paroxysmal atrial fibrillation) (HCC)    a. noted post-operatively following CABG in 2013; b. recurrence 06/2017; c. CHADS2VASc => 4 (HTN, age x 1, DM, vascular disease); d. On Eliquis   Past Surgical History:  Procedure Laterality Date  . CARDIAC CATHETERIZATION  12/18/13   Occluded SVGs  . CORONARY ARTERY BYPASS GRAFT  04/28/2012   Procedure: CORONARY ARTERY BYPASS GRAFTING (CABG);  Surgeon: Ivin Poot, MD;  Location: [place];  Service: Open Heart Surgery;  Laterality: N/A;  Right saphenous vein used for bypass grafting. Five  bypasses performed including left mammary artery.   . INGUINAL HERNIA REPAIR     right   . LEFT HEART CATHETERIZATION WITH CORONARY ANGIOGRAM N/A 04/27/2012   Procedure: LEFT HEART CATHETERIZATION WITH CORONARY ANGIOGRAM;  Surgeon: Larey Dresser, MD;  Location: [facility] CATH LAB;  Service: Cardiovascular;  Laterality: N/A;  . LEFT HEART CATHETERIZATION WITH CORONARY/GRAFT ANGIOGRAM N/A 12/18/2013   Procedure: LEFT HEART CATHETERIZATION WITH Beatrix Fetters;  Surgeon: Sinclair Grooms, MD;  Location: [facility] CATH LAB;  Service: Cardiovascular;  Laterality: N/A;   Social History   Socioeconomic History  . Marital  status: Married    Spouse name: Not on file  . Number of children: Not on file  . Years of education: Not on file  . Highest education level: Not on file  Occupational History  . Not on file  Tobacco Use  . Smoking status: Never Smoker  . Smokeless tobacco: Never Used  Substance and Sexual Activity  . Alcohol use: Not Currently  . Drug use: No  . Sexual activity: Yes  Other Topics Concern  . Not on file  Social History Narrative  . Not on file   Social Determinants of Health   Financial Resource Strain:   . Difficulty of Paying Living Expenses:   Food Insecurity:   . Worried About Charity fundraiser in the Last Year:   . Arboriculturist in the Last Year:   Transportation Needs:   . Film/video editor (Medical):   Marland Kitchen Lack of Transportation (Non-Medical):   Physical Activity:   . Days of Exercise per Week:   . Minutes of Exercise per Session:   Stress:   . Feeling of Stress :   Social Connections:   . Frequency of Communication with Friends and Family:   . Frequency of Social Gatherings with Friends and Family:   . Attends Religious Services:   . Active Member of Clubs or Organizations:   . Attends Archivist Meetings:   Marland Kitchen Marital Status:   Intimate Partner Violence:   . Fear of Current or Ex-Partner:   . Emotionally Abused:   Marland Kitchen Physically Abused:   . Sexually Abused:    Family History  Problem Relation Age of Onset  . CAD Mother   . Heart attack Mother   . CAD Father   . Diabetes Father   . Heart attack Father   . Colon cancer Neg Hx   . Prostate cancer Neg Hx    Current Outpatient Medications on File Prior to Visit  Medication Sig  . acetaminophen (TYLENOL) 325 MG tablet Take 650 mg by mouth every 6 (six) hours as needed for headache.  Marland Kitchen apixaban (ELIQUIS) 5 MG TABS tablet Take 1 tablet (5 mg total) by mouth 2 (two) times daily.  Marland Kitchen aspirin EC 81 MG tablet Take 81 mg by mouth daily.  Marland Kitchen atorvastatin (LIPITOR) 80 MG tablet TAKE 1 TABLET BY  MOUTH AT 6PM AS DIRECTED  . Coenzyme Q10 (CO Q 10) 100 MG CAPS Take 200 mg by mouth daily.  . fish oil-omega-3 fatty acids 1000 MG capsule Take 1 g by mouth daily.  . isosorbide mononitrate (IMDUR) 30 MG 24 hr tablet Take 1 tablet (30 mg total) by mouth daily.  Marland Kitchen lisinopril (ZESTRIL) 10 MG tablet Take 1 tablet (10 mg total) by mouth daily.  . metFORMIN (GLUCOPHAGE) 500 MG tablet TAKE 1 TABLET (500 MG TOTAL) BY MOUTH 2 (TWO) TIMES DAILY WITH A MEAL.  . metoprolol tartrate (LOPRESSOR) 50 MG tablet Take 1 tablet (50 mg total) by mouth 2 (two) times daily.  . nitroGLYCERIN (NITROSTAT) 0.4 MG SL tablet Place 1 tablet (0.4 mg total) under the tongue every 5 (five) minutes as needed for chest pain.  . polyethylene glycol-electrolytes (NULYTELY)  420 g solution See admin instructions.   No current facility-administered medications on file prior to visit.    Review of Systems  Constitutional: Negative for activity change, appetite change, chills, diaphoresis, fatigue and fever.  HENT: Negative for congestion and hearing loss.   Eyes: Negative for visual disturbance.  Respiratory: Negative for apnea, cough, chest tightness, shortness of breath and wheezing.   Cardiovascular: Negative for chest pain, palpitations and leg swelling.  Gastrointestinal: Negative for abdominal pain, anal bleeding, blood in stool, constipation, diarrhea, nausea and vomiting.  Endocrine: Negative for cold intolerance.  Genitourinary: Negative for decreased urine volume, difficulty urinating, dysuria, frequency and hematuria.  Musculoskeletal: Negative for arthralgias, back pain and neck pain.  Skin: Negative for rash.  Allergic/Immunologic: Negative for environmental allergies.  Neurological: Negative for dizziness, weakness, light-headedness, numbness and headaches.  Hematological: Negative for adenopathy.  Psychiatric/Behavioral: Negative for behavioral problems, dysphoric mood and sleep disturbance. The patient is not  nervous/anxious.    Per HPI unless specifically indicated above      Objective:    BP 124/70   Pulse (!) 52   Temp (!) 97.1 F (36.2 C) (Temporal)   Resp 16   Ht 6\' 4"  (1.93 m)   Wt 239 lb (108.4 kg)   BMI 29.09 kg/m   Wt Readings from Last 3 Encounters:  09/18/19 239 lb (108.4 kg)  06/14/19 235 lb 8 oz (106.8 kg)  04/18/19 237 lb (107.5 kg)    Physical Exam Vitals and nursing note reviewed.  Constitutional:      General: He is not in acute distress.    Appearance: He is well-developed. He is not diaphoretic.     Comments: Well-appearing, comfortable, cooperative  HENT:     Head: Normocephalic and atraumatic.  Eyes:     General:        Right eye: No discharge.        Left eye: No discharge.     Conjunctiva/sclera: Conjunctivae normal.     Pupils: Pupils are equal, round, and reactive to light.  Neck:     Thyroid: No thyromegaly.     Vascular: No carotid bruit.  Cardiovascular:     Rate and Rhythm: Normal rate and regular rhythm.     Heart sounds: Normal heart sounds. No murmur.     Comments: No ectopy or irregularity today Pulmonary:     Effort: Pulmonary effort is normal. No respiratory distress.     Breath sounds: Normal breath sounds. No wheezing or rales.  Abdominal:     General: Bowel sounds are normal. There is no distension.     Palpations: Abdomen is soft. There is no mass.     Tenderness: There is no abdominal tenderness.  Musculoskeletal:        General: No tenderness. Normal range of motion.     Cervical back: Normal range of motion and neck supple.     Comments: Upper / Lower Extremities: - Normal muscle tone, strength bilateral upper extremities 5/5, lower extremities 5/5  Lymphadenopathy:     Cervical: No cervical adenopathy.  Skin:    General: Skin is warm and dry.     Findings: No erythema or rash.  Neurological:     Mental Status: He is alert and oriented to person, place, and time.     Comments: Distal sensation intact to light touch all  extremities  Psychiatric:        Behavior: Behavior normal.     Comments: Well groomed, good eye contact, normal  speech and thoughts       Results for orders placed or performed in visit on 09/12/19  PSA  Result Value Ref Range   PSA 1.1 < OR = 4.0 ng/mL  Lipid panel  Result Value Ref Range   Cholesterol 116 <200 mg/dL   HDL 44 > OR = 40 mg/dL   Triglycerides 50 <150 mg/dL   LDL Cholesterol (Calc) 60 mg/dL (calc)   Total CHOL/HDL Ratio 2.6 <5.0 (calc)   Non-HDL Cholesterol (Calc) 72 <130 mg/dL (calc)  COMPLETE METABOLIC PANEL WITH GFR  Result Value Ref Range   Glucose, Bld 160 (H) 65 - 99 mg/dL   BUN 22 7 - 25 mg/dL   Creat 1.39 (H) 0.70 - 1.25 mg/dL   GFR, Est Non African American 52 (L) > OR = 60 mL/min/1.27m2   GFR, Est African American 60 > OR = 60 mL/min/1.4m2   BUN/Creatinine Ratio 16 6 - 22 (calc)   Sodium 140 135 - 146 mmol/L   Potassium 5.0 3.5 - 5.3 mmol/L   Chloride 101 98 - 110 mmol/L   CO2 31 20 - 32 mmol/L   Calcium 10.5 (H) 8.6 - 10.3 mg/dL   Total Protein 7.3 6.1 - 8.1 g/dL   Albumin 4.6 3.6 - 5.1 g/dL   Globulin 2.7 1.9 - 3.7 g/dL (calc)   AG Ratio 1.7 1.0 - 2.5 (calc)   Total Bilirubin 0.9 0.2 - 1.2 mg/dL   Alkaline phosphatase (APISO) 80 35 - 144 U/L   AST 33 10 - 35 U/L   ALT 30 9 - 46 U/L  CBC with Differential/Platelet  Result Value Ref Range   WBC 6.5 3.8 - 10.8 Thousand/uL   RBC 4.97 4.20 - 5.80 Million/uL   Hemoglobin 15.0 13.2 - 17.1 g/dL   HCT 45.3 38.5 - 50.0 %   MCV 91.1 80.0 - 100.0 fL   MCH 30.2 27.0 - 33.0 pg   MCHC 33.1 32.0 - 36.0 g/dL   RDW 13.0 11.0 - 15.0 %   Platelets 212 140 - 400 Thousand/uL   MPV 12.3 7.5 - 12.5 fL   Neutro Abs 3,484 1,500 - 7,800 cells/uL   Lymphs Abs 2,132 850 - 3,900 cells/uL   Absolute Monocytes 657 200 - 950 cells/uL   Eosinophils Absolute 189 15 - 500 cells/uL   Basophils Absolute 39 0 - 200 cells/uL   Neutrophils Relative % 53.6 %   Total Lymphocyte 32.8 %   Monocytes Relative 10.1 %    Eosinophils Relative 2.9 %   Basophils Relative 0.6 %  Hemoglobin A1c  Result Value Ref Range   Hgb A1c MFr Bld 7.4 (H) <5.7 % of total Hgb   Mean Plasma Glucose 166 (calc)   eAG (mmol/L) 9.2 (calc)      Assessment & Plan:   Problem List Items Addressed This Visit    Type 2 diabetes mellitus with diabetic nephropathy (HCC)    Elevated A1c up to 7.4, now with improving lifestyle Complications - CKD-III, hyperlipidemia, CAD, obesity - increases risk of future cardiovascular complications   Plan:  1. Continue current Metformin 500mg  BID - advised may adjust dose and increase as indicated based on sugars, can defer for now but may increase to 500/1000 in future or 1000 BID if still inc A1c in future - Future consider injectable GLP1 meds, due to CKD limit other options 2. Encourage improved lifestyle - low carb, low sugar diet, reduce portion size, continue improving and start more regular exercise 3.  Check CBG, bring log to next visit for review - check insurance for covered brand glucometer call back if need 4. Continue ASA, ACEi, Statin 5. Advised to schedule DM ophtho exam, send record - [place]      Paroxysmal atrial fibrillation ([place])    Stable without evidence of AFib on exam On anticoagulation Eliquis, rate control BB Followed by Cardiology      Overweight (BMI 25.0-29.9)    Weight stable to slightly increased in past 4-5 months Encourage lifestyle diet exercise Now warmer weather inc exercise      Hyperlipidemia associated with type 2 diabetes mellitus ([place])    Well controlled cholesterol on statin  Last lipid panel 08/2019 Known CAD CABG  Plan: 1. Continue current meds - Atorvastatin 80mg  daily, Fish Oil omega 3 2. Continue ASA 81mg  for secondary ASCVD risk reduction 3. Encourage improved lifestyle - low carb/cholesterol, reduce portion size, continue improving regular exercise       Coronary artery disease of bypass graft of native heart with stable angina  pectoris (HCC) (Chronic)    Stable, without anginal symptoms Controlled on Imdur and med management Followed by Dr Saunders [facility] Cardiology      CKD (chronic kidney disease), stage III    Secondary to DM, HTN, age With CKD-III On ACEi      Benign hypertension with CKD (chronic kidney disease) stage III    Controlled HTN, on increased dose lisinopril Complicated by CAD, CKD-III    Plan:  1. Continue current BP regimen - Metoprolol 50mg  BID, Lisiniopril 10mg  daily, Isosorbide mononite 2. Encourage improved lifestyle - low sodium diet, regular exercise 3. Continue monitor BP outside office, bring readings to next visit, if persistently >140/90 or new symptoms notify office sooner       Other Visit Diagnoses    Annual physical exam    -  Primary   Need for 23-polyvalent pneumococcal polysaccharide vaccine       Relevant Orders   Pneumococcal polysaccharide vaccine 23-valent greater than or equal to 2yo subcutaneous/IM (Completed)      Updated Health Maintenance information - Next colon cancer screening in 2023 either repeat colonoscopy w Dr Annet[facility] or repeat Cologuard - Final Pneumonia vaccien done, Pneumovax23 today, completed series. Age >74 Reviewed recent lab results with patient Encouraged improvement to lifestyle with diet and exercise Maintain healthy weight, goal wt loss  No orders of the defined types were placed in this encounter.     Follow up plan: Return in about 6 months (around 03/19/2020) for 6 month DM A1c HTN.  Nobie Putnam, [facility] 09/18/2019, 9:08 AM

## 2019-09-18 NOTE — Assessment & Plan Note (Signed)
Stable, without anginal symptoms Controlled on Imdur and med management Followed by Dr Saunders Revel [facility] Cardiology

## 2019-09-18 NOTE — Assessment & Plan Note (Signed)
Secondary to DM, HTN, age With CKD-III On ACEi 

## 2019-09-18 NOTE — Assessment & Plan Note (Signed)
Stable without evidence of AFib on exam On anticoagulation Eliquis, rate control BB Followed by Cardiology

## 2019-09-18 NOTE — Assessment & Plan Note (Signed)
Controlled HTN, on increased dose lisinopril Complicated by CAD, CKD-III    Plan:  1. Continue current BP regimen - Metoprolol 50mg  BID, Lisiniopril 10mg  daily, Isosorbide mononite 2. Encourage improved lifestyle - low sodium diet, regular exercise 3. Continue monitor BP outside office, bring readings to next visit, if persistently >140/90 or new symptoms notify office sooner

## 2019-09-18 NOTE — Assessment & Plan Note (Signed)
Elevated A1c up to 7.4, now with improving lifestyle Complications - CKD-III, hyperlipidemia, CAD, obesity - increases risk of future cardiovascular complications   Plan:  1. Continue current Metformin 500mg  BID - advised may adjust dose and increase as indicated based on sugars, can defer for now but may increase to 500/1000 in future or 1000 BID if still inc A1c in future - Future consider injectable GLP1 meds, due to CKD limit other options 2. Encourage improved lifestyle - low carb, low sugar diet, reduce portion size, continue improving and start more regular exercise 3. Check CBG, bring log to next visit for review - check insurance for covered brand glucometer call back if need 4. Continue ASA, ACEi, Statin 5. Advised to schedule DM ophtho exam, send record - [name] [name]

## 2019-12-21 ENCOUNTER — Other Ambulatory Visit: Payer: Self-pay | Admitting: Family Medicine

## 2019-12-21 DIAGNOSIS — E1121 Type 2 diabetes mellitus with diabetic nephropathy: Secondary | ICD-10-CM

## 2020-03-13 ENCOUNTER — Other Ambulatory Visit: Payer: Self-pay | Admitting: Family Medicine

## 2020-03-13 DIAGNOSIS — E1121 Type 2 diabetes mellitus with diabetic nephropathy: Secondary | ICD-10-CM

## 2020-03-13 NOTE — Telephone Encounter (Signed)
Requested Prescriptions  Pending Prescriptions Disp Refills  . metFORMIN (GLUCOPHAGE) 500 MG tablet [Pharmacy Med Name: METFORMIN HCL 500 MG TABLET] 180 tablet 0    Sig: TAKE 1 TABLET (500 MG TOTAL) BY MOUTH 2 (TWO) TIMES DAILY WITH A MEAL.     Endocrinology:  Diabetes - Biguanides Failed - 03/13/2020  1:24 AM      Failed - Cr in normal range and within 360 days    Creat  Date Value Ref Range Status  09/12/2019 1.39 (H) 0.70 - 1.25 mg/dL Final    Comment:    For patients >63 years of age, the reference limit for Creatinine is approximately 13% higher for people identified as African-American. .          Failed - HBA1C is between 0 and 7.9 and within 180 days    Hgb A1c MFr Bld  Date Value Ref Range Status  09/12/2019 7.4 (H) <5.7 % of total Hgb Final    Comment:    For someone without known diabetes, a hemoglobin A1c value of 6.5% or greater indicates that they may have  diabetes and this should be confirmed with a follow-up  test. . For someone with known diabetes, a value <7% indicates  that their diabetes is well controlled and a value  greater than or equal to 7% indicates suboptimal  control. A1c targets should be individualized based on  duration of diabetes, age, comorbid conditions, and  other considerations. . Currently, no consensus exists regarding use of hemoglobin A1c for diagnosis of diabetes for children. .          Passed - eGFR in normal range and within 360 days    GFR, Est African American  Date Value Ref Range Status  09/12/2019 60 > OR = 60 mL/min/1.50m Final   GFR, Est Non African American  Date Value Ref Range Status  09/12/2019 52 (L) > OR = 60 mL/min/1.721mFinal   GFR  Date Value Ref Range Status  06/11/2014 64.45 >60.00 mL/min Final         Passed - Valid encounter within last 6 months    Recent Outpatient Visits          5 months ago [facility] physical [facility]   [facility]   11 months ago  Type 2 diabetes mellitus with diabetic nephropathy, without long-term current use of insulin ([place]  [facility]   1 year ago Type 2 diabetes mellitus with diabetic nephropathy, without long-term current use of insulin ([facility]  [facility]   1 year ago Type 2 diabetes mellitus with diabetic nephropathy, without long-term current use of insulin ([facility]  [facility]   1 year ago Annual physical exam   [facility]      Future Appointments            In 6 days [facility]

## 2020-03-19 ENCOUNTER — Encounter: Payer: Self-pay | Admitting: Family Medicine

## 2020-03-19 ENCOUNTER — Other Ambulatory Visit: Payer: Self-pay

## 2020-03-19 ENCOUNTER — Ambulatory Visit (INDEPENDENT_AMBULATORY_CARE_PROVIDER_SITE_OTHER): Payer: Medicare HMO | Admitting: Family Medicine

## 2020-03-19 ENCOUNTER — Other Ambulatory Visit: Payer: Self-pay | Admitting: Family Medicine

## 2020-03-19 VITALS — BP 135/80 | HR 129 | Temp 98.0°F | Resp 16 | Ht 76.0 in | Wt 235.0 lb

## 2020-03-19 DIAGNOSIS — E663 Overweight: Secondary | ICD-10-CM | POA: Diagnosis not present

## 2020-03-19 DIAGNOSIS — E1169 Type 2 diabetes mellitus with other specified complication: Secondary | ICD-10-CM

## 2020-03-19 DIAGNOSIS — N183 Chronic kidney disease, stage 3 unspecified: Secondary | ICD-10-CM

## 2020-03-19 DIAGNOSIS — I25708 Atherosclerosis of coronary artery bypass graft(s), unspecified, with other forms of angina pectoris: Secondary | ICD-10-CM

## 2020-03-19 DIAGNOSIS — I129 Hypertensive chronic kidney disease with stage 1 through stage 4 chronic kidney disease, or unspecified chronic kidney disease: Secondary | ICD-10-CM

## 2020-03-19 DIAGNOSIS — R7309 Other abnormal glucose: Secondary | ICD-10-CM

## 2020-03-19 DIAGNOSIS — Z23 Encounter for immunization: Secondary | ICD-10-CM | POA: Diagnosis not present

## 2020-03-19 DIAGNOSIS — R351 Nocturia: Secondary | ICD-10-CM

## 2020-03-19 DIAGNOSIS — N1831 Chronic kidney disease, stage 3a: Secondary | ICD-10-CM

## 2020-03-19 DIAGNOSIS — E1121 Type 2 diabetes mellitus with diabetic nephropathy: Secondary | ICD-10-CM

## 2020-03-19 DIAGNOSIS — Z0000 Encounter for general adult medical examination without abnormal findings: Secondary | ICD-10-CM

## 2020-03-19 LAB — POCT GLYCOSYLATED HEMOGLOBIN (HGB A1C): Hemoglobin A1C: 7.2 % — AB (ref 4.0–5.6)

## 2020-03-19 MED ORDER — METFORMIN HCL 500 MG PO TABS: 500.0000 mg | ORAL_TABLET | Freq: Two times a day (BID) | ORAL | 3 refills | Status: DC

## 2020-03-19 NOTE — Progress Notes (Signed)
Subjective:    Patient ID: [name] [name], male    DOB: March 24, 1951, 69 y.o.   MRN: [id number]  [name] [name] is a 69 y.o. male presenting on 03/19/2020 for Diabetes   HPI   Type 2 Diabetes, with Nephropathy CKD III See prior notes for background information. - Today doing well CBGs: No CBGs Meds:Metformin 500mg  BID - tolerating well without side effects Currently on ACEi Lifestyle: Weight down 4 lbs in past >6 months, overall stable in 1 year - Diet (improving diabetic diet, add green tea) - Exercise (improved exerciseoutdoors, yard workhe is tolerating exertion even better,stationarybike) - Father with diabetes He does not have an eye doctor for DM eye exam- he will schedule with [facility] again asked Denies hypoglycemia, visual changes, numbness or tingling, polyuria  CHRONIC HTN: Reportsno new concerns Follows with Cardiology Not checking BP Current Meds -Lisinopril 10mg  daily, Imdur 30mg  daily, Metoprolol 50mg  BID Reports good compliance, took meds today. Tolerating well, w/o complaints. Denies CP, dyspnea, HA, edema, dizziness / lightheadedness  CAD s/p CABG 2013 LHC with severe 3 vessel disease/ PAF History of hospitalization from yellow jacket stings and ultimately unstable angina led to [facility] and diagnosis, no prior issue of heart or heart damage. Followed by Cardiology since that time, has been on med management, and doing well - Taking Eliquis 5mg  BID, high intensity statin, ASA, omega 3 fish oil, imdur, ACEi, BB - has NTG SL. Not using it.  HYPERLIPIDEMIA: - Reports no concerns. Last lipid panel3/2021, controlled  - Currently takingAtorvastatin 80mg  daily, tolerating well without side effects or myalgias  Health Maintenance: [facility] - Dr Cristina Gong - Colonoscopy 05/2019 - tubular adenoma x 3 - benign, next repeat in 3 years 05/2022  UTD COVID Moderna  Due for Flu Shot, will receive today    Depression screen [facility] 2/9 03/19/2020  04/18/2019 01/15/2019  Decreased Interest 0 0 0  Down, Depressed, Hopeless 0 0 0  PHQ - 2 Score 0 0 0    Social History   Tobacco Use  . Smoking status: Never Smoker  . Smokeless tobacco: Never Used  Vaping Use  . Vaping Use: Never used  Substance Use Topics  . Alcohol use: Not Currently  . Drug use: No    Review of Systems Per HPI unless specifically indicated above     Objective:    BP 135/80   Pulse (!) 129   Temp 98 F (36.7 C) (Temporal)   Resp 16   Ht 6\' 4"  (1.93 m)   Wt 235 lb (106.6 kg)   SpO2 99%   BMI 28.61 kg/m   Wt Readings from Last 3 Encounters:  03/19/20 235 lb (106.6 kg)  09/18/19 239 lb (108.4 kg)  06/14/19 235 lb 8 oz (106.8 kg)    Physical Exam Vitals and nursing note reviewed.  Constitutional:      General: He is not in acute distress.    Appearance: He is well-developed. He is not diaphoretic.     Comments: Well-appearing, comfortable, cooperative  HENT:     Head: Normocephalic and atraumatic.  Eyes:     General:        Right eye: No discharge.        Left eye: No discharge.     Conjunctiva/sclera: Conjunctivae normal.  Neck:     Thyroid: No thyromegaly.  Cardiovascular:     Rate and Rhythm: Normal rate and regular rhythm.     Heart sounds: Normal heart sounds. No murmur  heard.   Pulmonary:     Effort: Pulmonary effort is normal. No respiratory distress.     Breath sounds: Normal breath sounds. No wheezing or rales.  Musculoskeletal:        General: Normal range of motion.     Cervical back: Normal range of motion and neck supple.     Right lower leg: No edema.     Left lower leg: No edema.     Comments: Bilateral varicose veins significant R>L lower ext  Lymphadenopathy:     Cervical: No cervical adenopathy.  Skin:    General: Skin is warm and dry.     Findings: No erythema or rash.  Neurological:     Mental Status: He is alert and oriented to person, place, and time.  Psychiatric:        Behavior: Behavior normal.      Comments: Well groomed, good eye contact, normal speech and thoughts      Diabetic Foot Exam - Simple   Simple Foot Form Diabetic Foot exam was performed with the following findings: Yes 03/19/2020  8:26 AM  Visual Inspection See comments: Yes Sensation Testing Intact to touch and monofilament testing bilaterally: Yes Pulse Check Posterior Tibialis and Dorsalis pulse intact bilaterally: Yes Comments Bilateral callus formation both heels and great toe, no ulceration, no reduced sensation.    Recent Labs    04/18/19 0835 09/12/19 0812 03/19/20 0821  HGBA1C 6.7* 7.4* 7.2*     Results for orders placed or performed in visit on 03/19/20  POCT HgB A1C  Result Value Ref Range   Hemoglobin A1C 7.2 (A) 4.0 - 5.6 %      Assessment & Plan:   Problem List Items Addressed This Visit    Type 2 diabetes mellitus with diabetic nephropathy (HCC) - Primary    Improved A1c down to 7.2, with improving lifestyle Complications - CKD-III, hyperlipidemia, CAD, obesity - increases risk of future cardiovascular complications   Plan:  1. Continue current Metformin 500mg  BID - no dose change today - Future consider injectable GLP1 meds, due to CKD limit other options 2. Encourage improved lifestyle - low carb, low sugar diet, reduce portion size, continue improving and start more regular exercise 3. Check CBG, bring log to next visit for review - check insurance for covered brand glucometer call back if need 4. Continue ASA, ACEi, Statin 5. Advised to schedule DM ophtho exam, send record - [place] 6. DM Foot exam today      Relevant Medications   metFORMIN (GLUCOPHAGE) 500 MG tablet   Other Relevant Orders   POCT HgB A1C (Completed)   Overweight (BMI 25.0-29.9)    Weight stable Encourage lifestyle diet exercise      CKD (chronic kidney disease), stage III ([place])    Secondary to DM, HTN, age With CKD-III On ACEi      Benign hypertension with CKD (chronic kidney disease) stage III  (HCC)    Controlled HTN Complicated by CAD, CKD-III    Plan:  1. Continue current BP regimen - Metoprolol 50mg  BID, Lisiniopril 10mg  daily, Isosorbide mononite 2. Encourage improved lifestyle - low sodium diet, regular exercise 3. Continue monitor BP outside office, bring readings to next visit, if persistently >140/90 or new symptoms notify office sooner       Other Visit Diagnoses    Needs flu shot       Relevant Orders   Flu Vaccine QUAD High Dose(Fluad) (Completed)        Meds  ordered this encounter  Medications  . metFORMIN (GLUCOPHAGE) 500 MG tablet    Sig: Take 1 tablet (500 mg total) by mouth 2 (two) times daily with a meal.    Dispense:  180 tablet    Refill:  3    Add refills      Follow up plan: Return in about 6 months (around 09/17/2020) for 6 month fasting lab only then 1 week later Annual Physical.  Future labs ordered for 09/11/20   Nobie Putnam, [facility] 03/19/2020, 8:16 AM

## 2020-03-19 NOTE — Assessment & Plan Note (Addendum)
Improved A1c down to 7.2, with improving lifestyle Complications - CKD-III, hyperlipidemia, CAD, obesity - increases risk of future cardiovascular complications   Plan:  1. Continue current Metformin 500mg  BID - no dose change today - Future consider injectable GLP1 meds, due to CKD limit other options 2. Encourage improved lifestyle - low carb, low sugar diet, reduce portion size, continue improving and start more regular exercise 3. Check CBG, bring log to next visit for review - check insurance for covered brand glucometer call back if need 4. Continue ASA, ACEi, Statin 5. Advised to schedule DM ophtho exam, send record - [place] 6. DM Foot exam today

## 2020-03-19 NOTE — Assessment & Plan Note (Signed)
Secondary to DM, HTN, age With CKD-III On ACEi

## 2020-03-19 NOTE — Assessment & Plan Note (Signed)
Controlled HTN Complicated by CAD, CKD-III    Plan:  1. Continue current BP regimen - Metoprolol 50mg  BID, Lisiniopril 10mg  daily, Isosorbide mononite 2. Encourage improved lifestyle - low sodium diet, regular exercise 3. Continue monitor BP outside office, bring readings to next visit, if persistently >140/90 or new symptoms notify office sooner

## 2020-03-19 NOTE — Assessment & Plan Note (Signed)
Weight stable Encourage lifestyle diet exercise

## 2020-03-19 NOTE — Patient Instructions (Addendum)
Thank you for coming to the office today.  Recent Labs    04/18/19 0835 09/12/19 0812 03/19/20 0821  HGBA1C 6.7* 7.4* 7.2[facility]   Address: [address] [place], [place] [address] Phone: [phone]  Website: [email or web address]  Recommend schedule eye exam before end of year, need to get copy of report.  High dose flu shot today  DUE for FASTING BLOOD WORK (no food or drink after midnight before the lab appointment, only water or coffee without cream/sugar on the morning of)  SCHEDULE "Lab Only" visit in the morning at the clinic for lab draw in 6 MONTHS   - Make sure Lab Only appointment is at about 1 week before your next appointment, so that results will be available  For Lab Results, once available within 2-3 days of blood draw, you can can log in to MyChart online to view your results and a brief explanation. Also, we can discuss results at next follow-up visit.   Please schedule a Follow-up Appointment to: Return in about 6 months (around 09/17/2020) for 6 month fasting lab only then 1 week later Annual Physical.  If you have any other questions or concerns, please feel free to call the office or send a message through [place]. You may also schedule an earlier appointment if necessary.  Additionally, you may be receiving a survey about your experience at our office within a few days to 1 week by e-mail or mail. We value your feedback.  Nobie Putnam, DO [place]

## 2020-05-17 ENCOUNTER — Other Ambulatory Visit: Payer: Self-pay | Admitting: Internal Medicine

## 2020-05-19 NOTE — Telephone Encounter (Signed)
Refill request

## 2020-05-19 NOTE — Telephone Encounter (Signed)
Patients recall was due in 01/22 and has been appropriately scheduled

## 2020-05-19 NOTE — Telephone Encounter (Signed)
Pt's age 69, wt 106.6 kg, SCr 1.39, CrCl 76.6, last ov w/ CE 05/17/19. Pt is overdue to see Dr. Saunders Revel.  Will send note to scheduling.

## 2020-06-18 ENCOUNTER — Other Ambulatory Visit: Payer: Self-pay | Admitting: Internal Medicine

## 2020-06-18 DIAGNOSIS — I10 Essential (primary) hypertension: Secondary | ICD-10-CM

## 2020-06-18 DIAGNOSIS — I480 Paroxysmal atrial fibrillation: Secondary | ICD-10-CM

## 2020-06-23 ENCOUNTER — Other Ambulatory Visit: Payer: Self-pay | Admitting: Internal Medicine

## 2020-06-24 ENCOUNTER — Other Ambulatory Visit: Payer: Self-pay

## 2020-06-24 MED ORDER — APIXABAN 5 MG PO TABS: 5.0000 mg | ORAL_TABLET | Freq: Two times a day (BID) | ORAL | 0 refills | Status: DC

## 2020-06-24 NOTE — Telephone Encounter (Signed)
*  STAT* If patient is at the pharmacy, call can be transferred to refill team.   1. Which medications need to be refilled? (please list name of each medication and dose if known) Eliquis  2. Which pharmacy/location (including street and city if local pharmacy) is medication to be sent to? [facility] [place]  3. Do they need a 30 day or 90 day supply? [place]

## 2020-06-24 NOTE — Telephone Encounter (Signed)
Prescription refill request for Eliquis received.   Indication:  Afib Last office visit: 06/14/2019, End Scr:  1.39, 09/12/2019 Age: 70 yo  Weight: 106.6 kg    Pt is overdue for an office visit. Scheduled to see Dr. Saunders Revel 07/04/2019. Gave 1 month supply to get him to appointment to see Dr. Saunders Revel.

## 2020-06-24 NOTE — Telephone Encounter (Signed)
Refill request

## 2020-06-24 NOTE — Telephone Encounter (Signed)
Refill Request.  

## 2020-07-02 NOTE — Progress Notes (Signed)
Follow-up Outpatient Visit Date: 07/03/2020  Primary Care Provider: Olin Hauser, DO [address] [id number]  Chief Complaint: Follow-up CAD and PAF  HPI:  [name] [name] is a 70 y.o. male with history of CAD status post CABG (2013), PAF, HTN, HLD, and DM2, who presents for follow-up of CAD and PAF.  I last saw him in 05/2019, at which time he was feeling well with stable chronic dyspnea on exertion.  Due to suboptimal blood pressure control, lisinopril was increased to 10 mg daily.  Today, [name] reports that he has been doing well other than issues with recurrent inguinal hernia that was repaired many years ago.  He has had to manually reduce it twice over the last 3 months.  He otherwise feels well, denying chest pain, shortness of breath, palpitations, lightheadedness, and edema.  He exercises regularly and notes that his heart rate usually goes up to ~114 bpm when he is working out.  He notes that he feels hot today; typically he is more cold-natured.  --------------------------------------------------------------------------------------------------  Past Medical/Surgical History: 1. HTN 2. Type II DM 3. Hyperlipidemia 4. CAD: Unstable angina 11/13 with LHC showing severe 3 vessel disease. He had CABG [name]) with LIMA-LAD, SVG-D2, seq SVG-PLV/PDA, SVG-OM. Echo (11/13) with EF 60%, mild LAE. Abnormal stress test in 7/15 with LHC showing occluded SVG-RCA, occluded SVG-D, occluded SVG-OM, and patent LIMA-LAD. Medical management.  5.Paroxysmal atrial fibrillation  Recent CV Pertinent Labs: Lab Results  Component Value Date   CHOL 116 09/12/2019   CHOL 121 07/06/2017   HDL 44 09/12/2019   HDL 37 (L) 07/06/2017   LDLCALC 60 09/12/2019   TRIG 50 09/12/2019   CHOLHDL 2.6 09/12/2019   INR 1.10 12/18/2013   K 5.0 09/12/2019   MG 2.0 07/06/2017   BUN 22 09/12/2019   BUN 16 08/08/2017   CREATININE 1.39 (H) 09/12/2019    Past medical and surgical  history were reviewed and updated in EPIC.  Current Meds  Medication Sig  . acetaminophen (TYLENOL) 325 MG tablet Take 650 mg by mouth every 6 (six) hours as needed for headache.  Marland Kitchen apixaban (ELIQUIS) 5 MG TABS tablet Take 1 tablet (5 mg total) by mouth 2 (two) times daily. NEEDS APPT W/ DR. Ziza Hastings FOR REFILLS.  Marland Kitchen aspirin EC 81 MG tablet Take 81 mg by mouth daily.  Marland Kitchen atorvastatin (LIPITOR) 80 MG tablet TAKE 1 TABLET BY MOUTH AT 6PM AS DIRECTED  . Coenzyme Q10 (CO Q 10) 100 MG CAPS Take 200 mg by mouth daily.  . fexofenadine (ALLEGRA) 180 MG tablet 1 tablet  . fish oil-omega-3 fatty acids 1000 MG capsule Take 1 g by mouth daily.  . isosorbide mononitrate (IMDUR) 30 MG 24 hr tablet TAKE 1 TABLET BY MOUTH EVERY DAY  . lisinopril (ZESTRIL) 10 MG tablet TAKE 1 TABLET BY MOUTH EVERY DAY  . lisinopril (ZESTRIL) 5 MG tablet Take 1 tablet by mouth daily.  . metFORMIN (GLUCOPHAGE) 500 MG tablet Take 1 tablet (500 mg total) by mouth 2 (two) times daily with a meal.  . metoprolol tartrate (LOPRESSOR) 50 MG tablet TAKE 1 TABLET BY MOUTH TWICE A DAY  . nitroGLYCERIN (NITROSTAT) 0.4 MG SL tablet Place 1 tablet (0.4 mg total) under the tongue every 5 (five) minutes as needed for chest pain.  . polyethylene glycol-electrolytes (NULYTELY) 420 g solution See admin instructions.    Allergies: Penicillins  Social History   Tobacco Use  . Smoking status: Never Smoker  .  Smokeless tobacco: Never Used  Vaping Use  . Vaping Use: Never used  Substance Use Topics  . Alcohol use: Not Currently  . Drug use: No    Family History  Problem Relation Age of Onset  . CAD Mother   . Heart attack Mother   . CAD Father   . Diabetes Father   . Heart attack Father   . Colon cancer Neg Hx   . Prostate cancer Neg Hx     Review of Systems: A 12-system review of systems was performed and was negative except as noted in the  HPI.  --------------------------------------------------------------------------------------------------  Physical Exam: BP 130/82 (BP Location: Left Arm, Patient Position: Sitting, Cuff Size: Normal)   Pulse 95   Ht 6\' 4"  (1.93 m)   Wt 242 lb (109.8 kg)   SpO2 98%   BMI 29.46 kg/m   General:  NAD. Neck: No JVD or HJR. Lungs: Clear to auscultation bilaterally without wheezes or crackles. Heart: Irregularly irregular without murmurs, rubs or gallops. Abdomen: Soft, nontender, nondistended. Extremities: No lower extremity edema.  EKG:  Atrial fibrillation with LAFB, poor R wave progression, and lateral T wave inversions.  Compared with prior tracing from 06/14/2019, a-fib has replaced sinus bradycardia.  Lab Results  Component Value Date   WBC 6.5 09/12/2019   HGB 15.0 09/12/2019   HCT 45.3 09/12/2019   MCV 91.1 09/12/2019   PLT 212 09/12/2019    Lab Results  Component Value Date   NA 140 09/12/2019   K 5.0 09/12/2019   CL 101 09/12/2019   CO2 31 09/12/2019   BUN 22 09/12/2019   CREATININE 1.39 (H) 09/12/2019   GLUCOSE 160 (H) 09/12/2019   ALT 30 09/12/2019    Lab Results  Component Value Date   CHOL 116 09/12/2019   HDL 44 09/12/2019   LDLCALC 60 09/12/2019   TRIG 50 09/12/2019   CHOLHDL 2.6 09/12/2019    --------------------------------------------------------------------------------------------------  ASSESSMENT AND PLAN: Paroxysmal atrial fibrillation: Mr. Cockrell is asymptomatic other than feeling hot.  It is unclear if this is due to a-fib.  Heart rate was recorded as 129 bpm at his visit with Dr. Parks Ranger in 03/2020, which is suspicious for a-fib though documented exam notes regular rate and rhythm.  We will continue anticoagulation with apixaban, which Mr. Genova has been compliant with.  Rate control is reasonable today, and I am reluctant to escalate metoprolol further today given history of resting bradycardia when in sinus rhythm.  I will have Mr.  Brands return in 2 weeks to see if he remains in a-fib.  If so, we will consider moving forward with cardioversion.  I will check a CBC, CMP, and TSH today to evaluate for potential precipitants of a-fib.  Coronary artery disease with stable angina: No symptoms to suggest worsening coronary insufficiency.  Continue current antianginal therapy (metoprolol and isosorbide mononitrate) as well as secondary prevention.  LDL at goal on last check in 08/2019.  Hypertension: Blood pressure borderline today.  Defer medication changes today.  Lifestyle modifications, including sodium restriction and weight loss, were reinforced.  Hyperlipidemia: LDL and triglycerides were well controlled on last check in 08/2019.  Continue atorvastatin 80 mg daily.  Consider discontinuation of fish oil in the future in the setting of recurrent PAF and excellent lipid panel on last check.  Follow-up: RTC in 2 weeks with APP.  Nelva Bush, MD 07/03/2020 9:46 AM

## 2020-07-03 ENCOUNTER — Other Ambulatory Visit: Payer: Self-pay

## 2020-07-03 ENCOUNTER — Encounter: Payer: Self-pay | Admitting: Internal Medicine

## 2020-07-03 ENCOUNTER — Ambulatory Visit: Payer: Medicare HMO | Admitting: Internal Medicine

## 2020-07-03 VITALS — BP 130/82 | HR 95 | Ht 76.0 in | Wt 242.0 lb

## 2020-07-03 DIAGNOSIS — I25708 Atherosclerosis of coronary artery bypass graft(s), unspecified, with other forms of angina pectoris: Secondary | ICD-10-CM | POA: Diagnosis not present

## 2020-07-03 DIAGNOSIS — I10 Essential (primary) hypertension: Secondary | ICD-10-CM

## 2020-07-03 DIAGNOSIS — E785 Hyperlipidemia, unspecified: Secondary | ICD-10-CM

## 2020-07-03 DIAGNOSIS — I480 Paroxysmal atrial fibrillation: Secondary | ICD-10-CM

## 2020-07-03 NOTE — Patient Instructions (Addendum)
Medication Instructions:  Your physician recommends that you continue on your current medications as directed. Please refer to the Current Medication list given to you today.  *If you need a refill on your cardiac medications before your next appointment, please call your pharmacy*   Lab Work: Your physician recommends that you return for lab work in: TODAY - CBC, CMP, TSH.  If you have labs (blood work) drawn today and your tests are completely normal, you will receive your results only by: Marland Kitchen MyChart Message (if you have MyChart) OR . A paper copy in the mail If you have any lab test that is abnormal or we need to change your treatment, we will call you to review the results.   Testing/Procedures: none  Follow-Up: At [facility], you and your health needs are our priority.  As part of our continuing mission to provide you with exceptional heart care, we have created designated Provider Care Teams.  These Care Teams include your primary Cardiologist (physician) and Advanced Practice Providers (APPs -  Physician Assistants and Nurse Practitioners) who all work together to provide you with the care you need, when you need it.  We recommend signing up for the patient portal called "MyChart".  Sign up information is provided on this After Visit Summary.  MyChart is used to connect with patients for Virtual Visits (Telemedicine).  Patients are able to view lab/test results, encounter notes, upcoming appointments, etc.  Non-urgent messages can be sent to your provider as well.   To learn more about what you can do with MyChart, go to [email or web address].    Your next appointment:   2 week(s) with APP to re-evaluation A.Fib.  Has seen Thurmond Butts in the past.   The format for your next appointment:   In Person  Provider:   You will see one of the following Advanced Practice Providers on your designated Care Team:    Murray Hodgkins, NP  Christell Faith, PA-C  Marrianne Mood,  PA-C  Cadence Johnston, [place]  Laurann Montana, NP

## 2020-07-04 ENCOUNTER — Encounter: Payer: Self-pay | Admitting: Internal Medicine

## 2020-07-04 DIAGNOSIS — E785 Hyperlipidemia, unspecified: Secondary | ICD-10-CM | POA: Insufficient documentation

## 2020-07-04 LAB — CBC
Hematocrit: 43.3 % (ref 37.5–51.0)
Hemoglobin: 14.6 g/dL (ref 13.0–17.7)
MCH: 30.3 pg (ref 26.6–33.0)
MCHC: 33.7 g/dL (ref 31.5–35.7)
MCV: 90 fL (ref 79–97)
Platelets: 209 10*3/uL (ref 150–450)
RBC: 4.82 x10E6/uL (ref 4.14–5.80)
RDW: 12.4 % (ref 11.6–15.4)
WBC: 7.4 10*3/uL (ref 3.4–10.8)

## 2020-07-04 LAB — COMPREHENSIVE METABOLIC PANEL
ALT: 36 IU/L (ref 0–44)
AST: 33 IU/L (ref 0–40)
Albumin/Globulin Ratio: 1.4 (ref 1.2–2.2)
Albumin: 4.2 g/dL (ref 3.8–4.8)
Alkaline Phosphatase: 98 IU/L (ref 44–121)
BUN/Creatinine Ratio: 18 (ref 10–24)
BUN: 22 mg/dL (ref 8–27)
Bilirubin Total: 0.6 mg/dL (ref 0.0–1.2)
CO2: 24 mmol/L (ref 20–29)
Calcium: 9.9 mg/dL (ref 8.6–10.2)
Chloride: 99 mmol/L (ref 96–106)
Creatinine, Ser: 1.23 mg/dL (ref 0.76–1.27)
GFR calc Af Amer: 69 mL/min/{1.73_m2} (ref >59)
GFR calc non Af Amer: 60 mL/min/{1.73_m2} (ref >59)
Globulin, Total: 3.1 g/dL (ref 1.5–4.5)
Glucose: 231 mg/dL — ABNORMAL HIGH (ref 65–99)
Potassium: 5.2 mmol/L (ref 3.5–5.2)
Sodium: 136 mmol/L (ref 134–144)
Total Protein: 7.3 g/dL (ref 6.0–8.5)

## 2020-07-04 LAB — TSH: TSH: 2.57 u[IU]/mL (ref 0.450–4.500)

## 2020-07-10 NOTE — Progress Notes (Signed)
Cardiology Office Note    Date:  07/18/2020   ID:  [name] [name], DOB Oct 12, 1950, MRN [id number]  PCP:  Olin Hauser, DO  Cardiologist:  Nelva Bush, MD  Electrophysiologist:  None   Chief Complaint: PAF follow up  History of Present Illness:   [name] [name] is a 70 y.o. male with history of CAD s/p 5-vessel CABG (LIMA-LAD, SVG-D2, sequential SVG-PLV/PDA, SVG-OM) in 2013, PAF, DM2, HTN, and HLD who presents for follow up of his Afib.  He had unstable angina in 2013 with LHC revealing severe 3-vessel CAD. In this setting, he underwent 5-vessel CABG as outlined above. Postoperative course was notable for Afib. Post cath echo showed an EF Of 60%, no RWMA, mildly dilated left atrium, and a PASP of 36 mmHg. In 12/2013, he underwent nuclear stress testing that was abnormal. Follow up [place] showed severe native vessel CAD with an occluded SVG-RCA, SVG-D, SVG-OM. The LIMA-LAD was patent. Medical management was advised. He had recurrent Afib in 06/2017 and was placed on Eliquis. Most recent echo from 07/2017 showed an EF of 50-55%, mild LVH, normal wall motion, Gr2DD, mild biatrial dilatation, mildly dilated right ventricle with mildly reduced RVSF. He was last seen in the office on 07/03/2020 and was doing well outside of issues with a recurrent inguinal hernia that was repaired years ago, that he was having to manually reduce. He also reproted feeling hot, when he reported typically being cold natured. He was noted to be back in Afib with reasonably controlled ventricular response. It was noted his heart rate was recorded to be 129 bpm at his PCP visit in 03/2020. He reported adherence with [place]. Rate control escalation was deferred given his resting bradycardia, when in sinus.    Today, the patient appears to be in [place] with controlled rates in the 70s. He is feeling better today. Over the last couple weeks he has been more active the and exercising regularly, using the recumbent  bike. He has also made diet changes. He decreased salt and sugar intake. He has lost weight (242lbs>236lbs). He denies palpitations, chest pain, sob. He has some MSK pain in his shoulders. No worseing lower leg swelling. No orthopnea, pnd, fever, chills.  He has been taking Eliquis and has not missed any doses. Plan for cardioversion.  Labs independently reviewed: 06/2020 - TSH normal, BUN 22, SCr 1.23, potassium 5.2, albumin 4.2, AST/ALT normal, HGB 14.6, PLT 209 03/2020 - A1c 7.2 08/2019 - TC 116, TG 50, HDL 44, LDL 60  Past Medical History:  Diagnosis Date  . Allergy   . Coronary artery disease 2013   a. [place] 2013 w/ severe 3-V CAD; b. s/p 4-V CABG in 2013 (LIMA-LAD, VG-D2, seq VG-PLA/PDA, VG-OM; c. LHC 2015 patent LIMA-LAD, occluded VGs, med Rx  . Headache(784.0)   . PAF (paroxysmal atrial fibrillation) (HCC)    a. noted post-operatively following CABG in 2013; b. recurrence 06/2017; c. CHADS2VASc => 4 (HTN, age x 1, DM, vascular disease); d. On Eliquis    Past Surgical History:  Procedure Laterality Date  . CARDIAC CATHETERIZATION  12/18/13   Occluded SVGs  . CORONARY ARTERY BYPASS GRAFT  04/28/2012   Procedure: CORONARY ARTERY BYPASS GRAFTING (CABG);  Surgeon: Ivin Poot, MD;  Location: [facility];  Service: Open Heart Surgery;  Laterality: N/A;  Right saphenous vein used for bypass grafting. Five  bypasses performed including left mammary artery.   . INGUINAL HERNIA REPAIR     right   .  LEFT HEART CATHETERIZATION WITH CORONARY ANGIOGRAM N/A 04/27/2012   Procedure: LEFT HEART CATHETERIZATION WITH CORONARY ANGIOGRAM;  Surgeon: Larey Dresser, MD;  Location[facility] LAB;  Service: Cardiovascular;  Laterality: N/A;  . LEFT HEART CATHETERIZATION WITH CORONARY/GRAFT ANGIOGRAM N/A 12/18/2013   Procedure: LEFT HEART CATHETERIZATION WITH Beatrix Fetters;  Surgeon: Sinclair Grooms, MD;  Location: [facility] CATH LAB;  Service: Cardiovascular;  Laterality: N/A;    Current Medications: Current  Meds  Medication Sig  . acetaminophen (TYLENOL) 325 MG tablet Take 650 mg by mouth every 6 (six) hours as needed for headache.  Marland Kitchen apixaban (ELIQUIS) 5 MG TABS tablet Take 1 tablet (5 mg total) by mouth 2 (two) times daily.  Marland Kitchen aspirin EC 81 MG tablet Take 81 mg by mouth daily.  Marland Kitchen atorvastatin (LIPITOR) 80 MG tablet TAKE 1 TABLET BY MOUTH AT 6PM AS DIRECTED  . Coenzyme Q10 (CO Q 10) 100 MG CAPS Take 200 mg by mouth daily.  . fexofenadine (ALLEGRA) 180 MG tablet 1 tablet  . fish oil-omega-3 fatty acids 1000 MG capsule Take 1 g by mouth daily.  . isosorbide mononitrate (IMDUR) 30 MG 24 hr tablet TAKE 1 TABLET BY MOUTH EVERY DAY  . lisinopril (ZESTRIL) 10 MG tablet TAKE 1 TABLET BY MOUTH EVERY DAY  . metFORMIN (GLUCOPHAGE) 500 MG tablet Take 1 tablet (500 mg total) by mouth 2 (two) times daily with a meal.  . metoprolol tartrate (LOPRESSOR) 50 MG tablet TAKE 1 TABLET BY MOUTH TWICE A DAY  . nitroGLYCERIN (NITROSTAT) 0.4 MG SL tablet Place 1 tablet (0.4 mg total) under the tongue every 5 (five) minutes as needed for chest pain.  . polyethylene glycol-electrolytes (NULYTELY) 420 g solution See admin instructions.    Allergies:   Penicillins   Social History   Socioeconomic History  . Marital status: Married    Spouse name: Not on file  . Number of children: Not on file  . Years of education: Not on file  . Highest education level: Not on file  Occupational History  . Not on file  Tobacco Use  . Smoking status: Never Smoker  . Smokeless tobacco: Never Used  Vaping Use  . Vaping Use: Never used  Substance and Sexual Activity  . Alcohol use: Not Currently  . Drug use: No  . Sexual activity: Yes  Other Topics Concern  . Not on file  Social History Narrative  . Not on file   Social Determinants of Health   Financial Resource Strain: Not on file  Food Insecurity: Not on file  Transportation Needs: Not on file  Physical Activity: Not on file  Stress: Not on file  Social  Connections: Not on file     Family History:  The patient's family history includes CAD in his father and mother; Diabetes in his father; Heart attack in his father and mother. There is no history of Colon cancer or Prostate cancer.  ROS:   ROS   EKGs/Labs/Other Studies Reviewed:    Studies reviewed were summarized above. The additional studies were reviewed today:  2D echo 07/2017: - Left ventricle: The cavity size was at the upper limits of  normal. Wall thickness was increased in a pattern of mild LVH.  Systolic function was normal. The estimated ejection fraction was  in the range of 50% to 55%. Wall motion was normal; there were no  regional wall motion abnormalities. Features are consistent with  a pseudonormal left ventricular filling pattern, with concomitant  abnormal  relaxation and increased filling pressure (grade 2  diastolic dysfunction). Doppler parameters are consistent with  high ventricular filling pressure.  - Ascending aorta: The ascending aorta was borderline enlarged.  - Left atrium: The atrium was mildly dilated.  - Right ventricle: The cavity size was mildly dilated. Wall  thickness was mildly increased. Systolic function was mildly  reduced.  - Right atrium: The atrium was mildly dilated. __________  2D echo 06/2016: - Left ventricle: The cavity size was normal. Wall thickness was  increased in a pattern of mild LVH. Systolic function was normal.  The estimated ejection fraction was in the range of 55% to 60%.  Wall motion was normal; there were no regional wall motion  abnormalities. Features are consistent with a pseudonormal left  ventricular filling pattern, with concomitant abnormal relaxation  and increased filling pressure (grade 2 diastolic dysfunction).  - Aortic valve: Mildly thickened leaflets. Transvalvular velocity  was within the normal range. There was no stenosis. There was no  regurgitation.  - Left  atrium: The atrium was mildly dilated.  - Right ventricle: The cavity size was normal. Wall thickness was  mildly increased. Systolic function was mildly reduced.  - Right atrium: The atrium was moderately dilated. __________  LHC 12/2013: Occluded SVG-RCA, occluded SVG-D, occluded SVG-OM, patent LIMA-LAD--> medical management. __________  Nuclear stress test 12/2013: IMPRESSION:  Suggestion of anterolateral and lateral wall ischemia with treadmill  exercise. There may also be some adjacent ischemia in the mid  anterior wall. Left ventricular function is normal with quantitative  ejection fraction calculation of 65%.  __________  2D echo 04/2012: - Left ventricle: Tecnically difficult post CABG images. The  cavity size was normal. The estimated ejection fraction  was 60%. Wall motion was normal; there were no regional  wall motion abnormalities.  - Left atrium: The atrium was mildly dilated.  - Right ventricle: I can not see the RV well. I think  function may be OK.  - Pulmonary arteries: PA peak pressure: 71mm Hg (S).  - Pericardium, extracardiac: There was no pericardial  effusion. __________  LHC 04/2012: Severe 3-vessel CAD--> 5-vessel CABG (LIMA-LAD, SVG-D2, sequential SVG-PLV/PDA, SVG-OM)   EKG:  EKG is ordered today.  The EKG ordered today demonstrates Aflutter, 78bpm, variable AV block, iRBBB, nonspecific T wave changes  Recent Labs: 07/03/2020: ALT 36; BUN 22; Creatinine, Ser 1.23; Hemoglobin 14.6; Platelets 209; Potassium 5.2; Sodium 136; TSH 2.570  Recent Lipid Panel    Component Value Date/Time   CHOL 116 09/12/2019 0812   CHOL 121 07/06/2017 1425   TRIG 50 09/12/2019 0812   HDL 44 09/12/2019 0812   HDL 37 (L) 07/06/2017 1425   CHOLHDL 2.6 09/12/2019 0812   VLDL 19 06/02/2016 1632   LDLCALC 60 09/12/2019 0812    PHYSICAL EXAM:    VS:  BP 122/72 (BP Location: Left Arm, Patient Position: Sitting, Cuff Size: Normal)   Pulse 98   Ht 6\' 4"  (1.93  m)   Wt 236 lb (107 kg)   SpO2 98%   BMI 28.73 kg/m   BMI: Body mass index is 28.73 kg/m.  Physical Exam  Wt Readings from Last 3 Encounters:  07/18/20 236 lb (107 kg)  07/03/20 242 lb (109.8 kg)  03/19/20 235 lb (106.6 kg)     ASSESSMENT & PLAN:   1. CAD status post CABG without angina: patient denies chest pain or sob. He has been exercising regularly without anginal symptoms. Continue Aspirin, Metoprolol, Atorvastatin and Imdur  2. PAF:  He is in aflutter/fib with controlled rates. He is overall symptomatically improved. He has lost weight as above. Cannot increase BB with baseline bradycardia. Recent labs were unremarkable. He is taking Eliquis 5mg  BID and has not missed any doses. Given that he is still in Afib/flutter will plan for DCCV.   3. HTN: Blood pressure better today at 122/72. Continue Imdur, Metoprolol, and lisinopril  4. HLD: LDL 60 with a TG of 50 from 08/2019 normal LFT in 06/2020.  He has made lifestyle changes and has lost 8lbs since the last visit. Continue current lifestyle changes. Continue Atorvastatin.   Disposition: F/u with Dr. Saunders Revel or an APP after cardioversion.   Medication Adjustments/Labs and Tests Ordered: Current medicines are reviewed at length with the patient today.  Concerns regarding medicines are outlined above. Medication changes, Labs and Tests ordered today are summarized above and listed in the Patient Instructions accessible in Encounters.   Signed, Soriya Worster Kathlen Mody, PA-C 07/18/2020 9:44 AM     [facility] - [address] [place], [place] [address] [phone]

## 2020-07-10 NOTE — H&P (View-Only) (Signed)
Cardiology Office Note    Date:  07/18/2020   ID:  [name] [name], DOB Oct 12, 1950, MRN [id number]  PCP:  Olin Hauser, DO  Cardiologist:  Nelva Bush, MD  Electrophysiologist:  None   Chief Complaint: PAF follow up  History of Present Illness:   [name] [name] is a 70 y.o. male with history of CAD s/p 5-vessel CABG (LIMA-LAD, SVG-D2, sequential SVG-PLV/PDA, SVG-OM) in 2013, PAF, DM2, HTN, and HLD who presents for follow up of his Afib.  He had unstable angina in 2013 with LHC revealing severe 3-vessel CAD. In this setting, he underwent 5-vessel CABG as outlined above. Postoperative course was notable for Afib. Post cath echo showed an EF Of 60%, no RWMA, mildly dilated left atrium, and a PASP of 36 mmHg. In 12/2013, he underwent nuclear stress testing that was abnormal. Follow up [place] showed severe native vessel CAD with an occluded SVG-RCA, SVG-D, SVG-OM. The LIMA-LAD was patent. Medical management was advised. He had recurrent Afib in 06/2017 and was placed on Eliquis. Most recent echo from 07/2017 showed an EF of 50-55%, mild LVH, normal wall motion, Gr2DD, mild biatrial dilatation, mildly dilated right ventricle with mildly reduced RVSF. He was last seen in the office on 07/03/2020 and was doing well outside of issues with a recurrent inguinal hernia that was repaired years ago, that he was having to manually reduce. He also reproted feeling hot, when he reported typically being cold natured. He was noted to be back in Afib with reasonably controlled ventricular response. It was noted his heart rate was recorded to be 129 bpm at his PCP visit in 03/2020. He reported adherence with [place]. Rate control escalation was deferred given his resting bradycardia, when in sinus.    Today, the patient appears to be in [place] with controlled rates in the 70s. He is feeling better today. Over the last couple weeks he has been more active the and exercising regularly, using the recumbent  bike. He has also made diet changes. He decreased salt and sugar intake. He has lost weight (242lbs>236lbs). He denies palpitations, chest pain, sob. He has some MSK pain in his shoulders. No worseing lower leg swelling. No orthopnea, pnd, fever, chills.  He has been taking Eliquis and has not missed any doses. Plan for cardioversion.  Labs independently reviewed: 06/2020 - TSH normal, BUN 22, SCr 1.23, potassium 5.2, albumin 4.2, AST/ALT normal, HGB 14.6, PLT 209 03/2020 - A1c 7.2 08/2019 - TC 116, TG 50, HDL 44, LDL 60  Past Medical History:  Diagnosis Date  . Allergy   . Coronary artery disease 2013   a. [place] 2013 w/ severe 3-V CAD; b. s/p 4-V CABG in 2013 (LIMA-LAD, VG-D2, seq VG-PLA/PDA, VG-OM; c. LHC 2015 patent LIMA-LAD, occluded VGs, med Rx  . Headache(784.0)   . PAF (paroxysmal atrial fibrillation) (HCC)    a. noted post-operatively following CABG in 2013; b. recurrence 06/2017; c. CHADS2VASc => 4 (HTN, age x 1, DM, vascular disease); d. On Eliquis    Past Surgical History:  Procedure Laterality Date  . CARDIAC CATHETERIZATION  12/18/13   Occluded SVGs  . CORONARY ARTERY BYPASS GRAFT  04/28/2012   Procedure: CORONARY ARTERY BYPASS GRAFTING (CABG);  Surgeon: Ivin Poot, MD;  Location: [facility];  Service: Open Heart Surgery;  Laterality: N/A;  Right saphenous vein used for bypass grafting. Five  bypasses performed including left mammary artery.   . INGUINAL HERNIA REPAIR     right   .  LEFT HEART CATHETERIZATION WITH CORONARY ANGIOGRAM N/A 04/27/2012   Procedure: LEFT HEART CATHETERIZATION WITH CORONARY ANGIOGRAM;  Surgeon: Larey Dresser, MD;  Location: [facility] CATH LAB;  Service: Cardiovascular;  Laterality: N/A;  . LEFT HEART CATHETERIZATION WITH CORONARY/GRAFT ANGIOGRAM N/A 12/18/2013   Procedure: LEFT HEART CATHETERIZATION WITH Beatrix Fetters;  Surgeon: Sinclair Grooms, MD;  Location: [facility] CATH LAB;  Service: Cardiovascular;  Laterality: N/A;    Current Medications: Current  Meds  Medication Sig  . acetaminophen (TYLENOL) 325 MG tablet Take 650 mg by mouth every 6 (six) hours as needed for headache.  Marland Kitchen apixaban (ELIQUIS) 5 MG TABS tablet Take 1 tablet (5 mg total) by mouth 2 (two) times daily.  Marland Kitchen aspirin EC 81 MG tablet Take 81 mg by mouth daily.  Marland Kitchen atorvastatin (LIPITOR) 80 MG tablet TAKE 1 TABLET BY MOUTH AT 6PM AS DIRECTED  . Coenzyme Q10 (CO Q 10) 100 MG CAPS Take 200 mg by mouth daily.  . fexofenadine (ALLEGRA) 180 MG tablet 1 tablet  . fish oil-omega-3 fatty acids 1000 MG capsule Take 1 g by mouth daily.  . isosorbide mononitrate (IMDUR) 30 MG 24 hr tablet TAKE 1 TABLET BY MOUTH EVERY DAY  . lisinopril (ZESTRIL) 10 MG tablet TAKE 1 TABLET BY MOUTH EVERY DAY  . metFORMIN (GLUCOPHAGE) 500 MG tablet Take 1 tablet (500 mg total) by mouth 2 (two) times daily with a meal.  . metoprolol tartrate (LOPRESSOR) 50 MG tablet TAKE 1 TABLET BY MOUTH TWICE A DAY  . nitroGLYCERIN (NITROSTAT) 0.4 MG SL tablet Place 1 tablet (0.4 mg total) under the tongue every 5 (five) minutes as needed for chest pain.  . polyethylene glycol-electrolytes (NULYTELY) 420 g solution See admin instructions.    Allergies:   Penicillins   Social History   Socioeconomic History  . Marital status: Married    Spouse name: Not on file  . Number of children: Not on file  . Years of education: Not on file  . Highest education level: Not on file  Occupational History  . Not on file  Tobacco Use  . Smoking status: Never Smoker  . Smokeless tobacco: Never Used  Vaping Use  . Vaping Use: Never used  Substance and Sexual Activity  . Alcohol use: Not Currently  . Drug use: No  . Sexual activity: Yes  Other Topics Concern  . Not on file  Social History Narrative  . Not on file   Social Determinants of Health   Financial Resource Strain: Not on file  Food Insecurity: Not on file  Transportation Needs: Not on file  Physical Activity: Not on file  Stress: Not on file  Social  Connections: Not on file     Family History:  The patient's family history includes CAD in his father and mother; Diabetes in his father; Heart attack in his father and mother. There is no history of Colon cancer or Prostate cancer.  ROS:   ROS   EKGs/Labs/Other Studies Reviewed:    Studies reviewed were summarized above. The additional studies were reviewed today:  2D echo 07/2017: - Left ventricle: The cavity size was at the upper limits of  normal. Wall thickness was increased in a pattern of mild LVH.  Systolic function was normal. The estimated ejection fraction was  in the range of 50% to 55%. Wall motion was normal; there were no  regional wall motion abnormalities. Features are consistent with  a pseudonormal left ventricular filling pattern, with concomitant  abnormal  relaxation and increased filling pressure (grade 2  diastolic dysfunction). Doppler parameters are consistent with  high ventricular filling pressure.  - Ascending aorta: The ascending aorta was borderline enlarged.  - Left atrium: The atrium was mildly dilated.  - Right ventricle: The cavity size was mildly dilated. Wall  thickness was mildly increased. Systolic function was mildly  reduced.  - Right atrium: The atrium was mildly dilated. __________  2D echo 06/2016: - Left ventricle: The cavity size was normal. Wall thickness was  increased in a pattern of mild LVH. Systolic function was normal.  The estimated ejection fraction was in the range of 55% to 60%.  Wall motion was normal; there were no regional wall motion  abnormalities. Features are consistent with a pseudonormal left  ventricular filling pattern, with concomitant abnormal relaxation  and increased filling pressure (grade 2 diastolic dysfunction).  - Aortic valve: Mildly thickened leaflets. Transvalvular velocity  was within the normal range. There was no stenosis. There was no  regurgitation.  - Left  atrium: The atrium was mildly dilated.  - Right ventricle: The cavity size was normal. Wall thickness was  mildly increased. Systolic function was mildly reduced.  - Right atrium: The atrium was moderately dilated. __________  LHC 12/2013: Occluded SVG-RCA, occluded SVG-D, occluded SVG-OM, patent LIMA-LAD--> medical management. __________  Nuclear stress test 12/2013: IMPRESSION:  Suggestion of anterolateral and lateral wall ischemia with treadmill  exercise. There may also be some adjacent ischemia in the mid  anterior wall. Left ventricular function is normal with quantitative  ejection fraction calculation of 65%.  __________  2D echo 04/2012: - Left ventricle: Tecnically difficult post CABG images. The  cavity size was normal. The estimated ejection fraction  was 60%. Wall motion was normal; there were no regional  wall motion abnormalities.  - Left atrium: The atrium was mildly dilated.  - Right ventricle: I can not see the RV well. I think  function may be OK.  - Pulmonary arteries: PA peak pressure: 71mm Hg (S).  - Pericardium, extracardiac: There was no pericardial  effusion. __________  LHC 04/2012: Severe 3-vessel CAD--> 5-vessel CABG (LIMA-LAD, SVG-D2, sequential SVG-PLV/PDA, SVG-OM)   EKG:  EKG is ordered today.  The EKG ordered today demonstrates Aflutter, 78bpm, variable AV block, iRBBB, nonspecific T wave changes  Recent Labs: 07/03/2020: ALT 36; BUN 22; Creatinine, Ser 1.23; Hemoglobin 14.6; Platelets 209; Potassium 5.2; Sodium 136; TSH 2.570  Recent Lipid Panel    Component Value Date/Time   CHOL 116 09/12/2019 0812   CHOL 121 07/06/2017 1425   TRIG 50 09/12/2019 0812   HDL 44 09/12/2019 0812   HDL 37 (L) 07/06/2017 1425   CHOLHDL 2.6 09/12/2019 0812   VLDL 19 06/02/2016 1632   LDLCALC 60 09/12/2019 0812    PHYSICAL EXAM:    VS:  BP 122/72 (BP Location: Left Arm, Patient Position: Sitting, Cuff Size: Normal)   Pulse 98   Ht 6\' 4"  (1.93  m)   Wt 236 lb (107 kg)   SpO2 98%   BMI 28.73 kg/m   BMI: Body mass index is 28.73 kg/m.  Physical Exam  Wt Readings from Last 3 Encounters:  07/18/20 236 lb (107 kg)  07/03/20 242 lb (109.8 kg)  03/19/20 235 lb (106.6 kg)     ASSESSMENT & PLAN:   1. CAD status post CABG without angina: patient denies chest pain or sob. He has been exercising regularly without anginal symptoms. Continue Aspirin, Metoprolol, Atorvastatin and Imdur  2. PAF:  He is in aflutter/fib with controlled rates. He is overall symptomatically improved. He has lost weight as above. Cannot increase BB with baseline bradycardia. Recent labs were unremarkable. He is taking Eliquis 5mg  BID and has not missed any doses. Given that he is still in Afib/flutter will plan for DCCV.   3. HTN: Blood pressure better today at 122/72. Continue Imdur, Metoprolol, and lisinopril  4. HLD: LDL 60 with a TG of 50 from 08/2019 normal LFT in 06/2020.  He has made lifestyle changes and has lost 8lbs since the last visit. Continue current lifestyle changes. Continue Atorvastatin.   Disposition: F/u with Dr. Saunders Revel or an APP after cardioversion.   Medication Adjustments/Labs and Tests Ordered: Current medicines are reviewed at length with the patient today.  Concerns regarding medicines are outlined above. Medication changes, Labs and Tests ordered today are summarized above and listed in the Patient Instructions accessible in Encounters.   Signed, Anallely Rosell Kathlen Mody, PA-C 07/18/2020 9:44 AM     [facility] - [place] [address] [place], [place] [address] [phone]

## 2020-07-15 ENCOUNTER — Other Ambulatory Visit: Payer: Self-pay | Admitting: Internal Medicine

## 2020-07-15 DIAGNOSIS — I480 Paroxysmal atrial fibrillation: Secondary | ICD-10-CM

## 2020-07-15 DIAGNOSIS — I10 Essential (primary) hypertension: Secondary | ICD-10-CM

## 2020-07-17 ENCOUNTER — Other Ambulatory Visit: Payer: Self-pay | Admitting: Internal Medicine

## 2020-07-17 NOTE — Telephone Encounter (Signed)
Refill Request.  

## 2020-07-17 NOTE — Telephone Encounter (Signed)
65m, 109.8kg, scr 1.23 (07/03/20), lovw/end (07/03/20), refill request received for eliquis 5mg  and patient is eligible based on qualifications so a refill will be sent.

## 2020-07-18 ENCOUNTER — Telehealth: Payer: Self-pay | Admitting: Internal Medicine

## 2020-07-18 ENCOUNTER — Other Ambulatory Visit
Admission: RE | Admit: 2020-07-18 | Discharge: 2020-07-18 | Disposition: A | Discharge status: Home / Self Care | Payer: Medicare HMO | Source: Ambulatory Visit | Attending: Internal Medicine | Admitting: Internal Medicine

## 2020-07-18 ENCOUNTER — Other Ambulatory Visit: Payer: Self-pay

## 2020-07-18 ENCOUNTER — Ambulatory Visit: Payer: Medicare HMO | Admitting: Medical

## 2020-07-18 ENCOUNTER — Encounter: Payer: Self-pay | Admitting: Physician Assistant

## 2020-07-18 VITALS — BP 122/72 | HR 98 | Ht 76.0 in | Wt 236.0 lb

## 2020-07-18 DIAGNOSIS — Z20822 Contact with and (suspected) exposure to covid-19: Secondary | ICD-10-CM | POA: Insufficient documentation

## 2020-07-18 DIAGNOSIS — E785 Hyperlipidemia, unspecified: Secondary | ICD-10-CM

## 2020-07-18 DIAGNOSIS — I480 Paroxysmal atrial fibrillation: Secondary | ICD-10-CM

## 2020-07-18 DIAGNOSIS — I25708 Atherosclerosis of coronary artery bypass graft(s), unspecified, with other forms of angina pectoris: Secondary | ICD-10-CM

## 2020-07-18 DIAGNOSIS — Z01812 Encounter for preprocedural laboratory examination: Secondary | ICD-10-CM | POA: Insufficient documentation

## 2020-07-18 DIAGNOSIS — I10 Essential (primary) hypertension: Secondary | ICD-10-CM | POA: Diagnosis not present

## 2020-07-18 NOTE — Telephone Encounter (Signed)
Spoke with patients wife and she was verbally emotional about him having it done here at [facility]. Explained what is done for the cardioversion then reviewed that this would be the earliest time for him to have this done. Provided reassurance and emotional support which helped her to calm down. Once we discussed it in more detail she was less emotional and agreeable that he have it done here at [facility] with Dr. Saunders Revel. Advised that if she had any further questions to please give [place] a call back and we will be happy to answer any questions or concerns. She verbalized understanding of our conversation with no other questions for now.

## 2020-07-18 NOTE — Telephone Encounter (Signed)
Patient spouse calling  Izora Gala would like to discuss cardioversion details  Please call

## 2020-07-18 NOTE — Patient Instructions (Addendum)
Medication Instructions:  No changes  *If you need a refill on your cardiac medications before your next appointment, please call your pharmacy*   Lab Work: None  If you have labs (blood work) drawn today and your tests are completely normal, you will receive your results only by: Marland Kitchen MyChart Message (if you have MyChart) OR . A paper copy in the mail If you have any lab test that is abnormal or we need to change your treatment, we will call you to review the results.   Testing/Procedures: You are scheduled for a Cardioversion on Tuesday February 8th with Dr. Saunders Revel Please arrive at the [facility] at 06:30 a.m. on the day of your procedure.  DIET INSTRUCTIONS:  Nothing to eat or drink after midnight except your medications with a small sip of water.         1) Labs: Once you get in your car to leave today, you will need to drive to [facility] to get COVID swab then go home and quarantine until the day of your cardioversion.   2) Medications:  Hold Metformin the morning of your procedure. YOU MAY TAKE ALL of your remaining medications with a small amount of water.  3) Must have a responsible person to drive you home.  4) Bring a current list of your medications and current insurance cards.    If you have any questions after you get home, please call the office at [phone]    Follow-Up: At [facility], you and your health needs are our priority.  As part of our continuing mission to provide you with exceptional heart care, we have created designated Provider Care Teams.  These Care Teams include your primary Cardiologist (physician) and Advanced Practice Providers (APPs -  Physician Assistants and Nurse Practitioners) who all work together to provide you with the care you need, when you need it.  We recommend signing up for the patient portal called "MyChart".  Sign up information is provided on this After Visit Summary.  MyChart is used to connect with  patients for Virtual Visits (Telemedicine).  Patients are able to view lab/test results, encounter notes, upcoming appointments, etc.  Non-urgent messages can be sent to your provider as well.   To learn more about what you can do with MyChart, go to [email or web address].    Your next appointment:   Follow up after Cardioversion  The format for your next appointment:   In Person  Provider:   You may see Nelva Bush, MD or one of the following Advanced Practice Providers on your designated Care Team:    Murray Hodgkins, NP  Christell Faith, PA-C  Marrianne Mood, PA-C  Cadence Hopewell, [place]  Laurann Montana, NP

## 2020-07-19 LAB — SARS CORONAVIRUS 2 (TAT 6-24 HRS): SARS Coronavirus 2: NEGATIVE

## 2020-07-22 ENCOUNTER — Ambulatory Visit
Admission: RE | Admit: 2020-07-22 | Discharge: 2020-07-22 | Disposition: A | Discharge status: Home / Self Care | Payer: Medicare HMO | Attending: Internal Medicine | Admitting: Internal Medicine

## 2020-07-22 ENCOUNTER — Other Ambulatory Visit: Payer: Self-pay

## 2020-07-22 ENCOUNTER — Encounter
Admission: RE | Disposition: A | Discharge status: Home / Self Care | Payer: Self-pay | Source: Home / Self Care | Attending: Internal Medicine

## 2020-07-22 ENCOUNTER — Encounter: Payer: Self-pay | Admitting: Internal Medicine

## 2020-07-22 ENCOUNTER — Ambulatory Visit: Payer: Medicare HMO | Admitting: Anesthesiology

## 2020-07-22 DIAGNOSIS — Z951 Presence of aortocoronary bypass graft: Secondary | ICD-10-CM | POA: Diagnosis not present

## 2020-07-22 DIAGNOSIS — I25110 Atherosclerotic heart disease of native coronary artery with unstable angina pectoris: Secondary | ICD-10-CM | POA: Diagnosis not present

## 2020-07-22 DIAGNOSIS — Z833 Family history of diabetes mellitus: Secondary | ICD-10-CM | POA: Insufficient documentation

## 2020-07-22 DIAGNOSIS — Z8249 Family history of ischemic heart disease and other diseases of the circulatory system: Secondary | ICD-10-CM | POA: Diagnosis not present

## 2020-07-22 DIAGNOSIS — I484 Atypical atrial flutter: Secondary | ICD-10-CM | POA: Diagnosis not present

## 2020-07-22 DIAGNOSIS — Z79899 Other long term (current) drug therapy: Secondary | ICD-10-CM | POA: Insufficient documentation

## 2020-07-22 DIAGNOSIS — Z7901 Long term (current) use of anticoagulants: Secondary | ICD-10-CM | POA: Insufficient documentation

## 2020-07-22 DIAGNOSIS — Z880 Allergy status to penicillin: Secondary | ICD-10-CM | POA: Diagnosis not present

## 2020-07-22 DIAGNOSIS — Z7984 Long term (current) use of oral hypoglycemic drugs: Secondary | ICD-10-CM | POA: Insufficient documentation

## 2020-07-22 DIAGNOSIS — I491 Atrial premature depolarization: Secondary | ICD-10-CM | POA: Diagnosis not present

## 2020-07-22 DIAGNOSIS — N183 Chronic kidney disease, stage 3 unspecified: Secondary | ICD-10-CM | POA: Diagnosis not present

## 2020-07-22 DIAGNOSIS — I4892 Unspecified atrial flutter: Secondary | ICD-10-CM | POA: Diagnosis not present

## 2020-07-22 DIAGNOSIS — Z7982 Long term (current) use of aspirin: Secondary | ICD-10-CM | POA: Insufficient documentation

## 2020-07-22 DIAGNOSIS — I10 Essential (primary) hypertension: Secondary | ICD-10-CM | POA: Diagnosis not present

## 2020-07-22 DIAGNOSIS — I480 Paroxysmal atrial fibrillation: Secondary | ICD-10-CM | POA: Insufficient documentation

## 2020-07-22 DIAGNOSIS — I25719 Atherosclerosis of autologous vein coronary artery bypass graft(s) with unspecified angina pectoris: Secondary | ICD-10-CM | POA: Diagnosis not present

## 2020-07-22 DIAGNOSIS — E785 Hyperlipidemia, unspecified: Secondary | ICD-10-CM | POA: Diagnosis not present

## 2020-07-22 DIAGNOSIS — I2510 Atherosclerotic heart disease of native coronary artery without angina pectoris: Secondary | ICD-10-CM | POA: Diagnosis not present

## 2020-07-22 DIAGNOSIS — E1122 Type 2 diabetes mellitus with diabetic chronic kidney disease: Secondary | ICD-10-CM | POA: Diagnosis not present

## 2020-07-22 DIAGNOSIS — E1169 Type 2 diabetes mellitus with other specified complication: Secondary | ICD-10-CM | POA: Diagnosis not present

## 2020-07-22 DIAGNOSIS — I129 Hypertensive chronic kidney disease with stage 1 through stage 4 chronic kidney disease, or unspecified chronic kidney disease: Secondary | ICD-10-CM | POA: Diagnosis not present

## 2020-07-22 HISTORY — PX: CARDIOVERSION: SHX1299

## 2020-07-22 SURGERY — CARDIOVERSION
Anesthesia: General

## 2020-07-22 MED ORDER — PROPOFOL 10 MG/ML IV BOLUS
INTRAVENOUS | Status: DC | PRN
  Administered 2020-07-22: 80 mg via INTRAVENOUS

## 2020-07-22 MED ORDER — PROPOFOL 10 MG/ML IV BOLUS
INTRAVENOUS | Status: AC
  Filled 2020-07-22: qty 20

## 2020-07-22 MED ORDER — METOPROLOL TARTRATE 50 MG PO TABS: 25.0000 mg | ORAL_TABLET | Freq: Two times a day (BID) | ORAL | 0 refills | Status: DC

## 2020-07-22 MED ORDER — SODIUM CHLORIDE 0.9 % IV SOLN: INTRAVENOUS | Status: DC | PRN

## 2020-07-22 NOTE — Interval H&P Note (Signed)
History and Physical Interval Note:  07/22/2020 7:19 AM  [name] [name]  has presented today for surgery, with the diagnosis of atrial fibrillation.  The various methods of treatment have been discussed with the patient and family. After consideration of risks, benefits and other options for treatment, the patient has consented to  Procedure(s): CARDIOVERSION (N/A) as a surgical intervention.  The patient's history has been reviewed, patient examined, no change in status, stable for surgery.  I have reviewed the patient's chart and labs.  Questions were answered to the patient's satisfaction.     [name] [name]

## 2020-07-22 NOTE — CV Procedure (Signed)
    Cardioversion Note  [name] [name] [id number] 1951/04/22  Procedure: DC Cardioversion Indications: Atrial flutter  Procedure Details Consent: Obtained Time Out: Verified patient identification, verified procedure, site/side was marked, verified correct patient position, special equipment/implants available, Radiology Safety Procedures followed,  medications/allergies/relevent history reviewed, required imaging and test results available.  Performed  The patient has been on adequate anticoagulation.  The patient received IV propofol by anesthesia for sedation.  Synchronous cardioversion was performed at 200 joules x 1.  The cardioversion was successful with restoration of sinus bradycardia with PAC's.  Complications: No apparent complications Patient did tolerate procedure well.  Recommendations:  Continue apixaban 5 mg BID.  Decrease metoprolol tartrate to 25 mg BID.  F/U as scheduled in office on 07/25/20.  Nelva Bush., MD 07/22/2020, 7:39 AM

## 2020-07-22 NOTE — Anesthesia Preprocedure Evaluation (Addendum)
Anesthesia Evaluation  Patient identified by MRN, date of birth, ID band Patient awake    Reviewed: Allergy & Precautions, H&P , NPO status , Patient's Chart, lab work & pertinent test results  History of Anesthesia Complications Negative for: history of anesthetic complications  Airway Mallampati: II  TM Distance: >3 FB     Dental  (+) Chipped, Missing   Pulmonary neg pulmonary ROS, neg sleep apnea, neg COPD,    breath sounds clear to auscultation       Cardiovascular hypertension, (-) angina+ CAD and + CABG  + dysrhythmias Atrial Fibrillation  Rhythm:irregular Rate:Normal     Neuro/Psych  Headaches, negative psych ROS   GI/Hepatic negative GI ROS, Neg liver ROS,   Endo/Other  diabetes  Renal/GU Renal disease (CKD)  negative genitourinary   Musculoskeletal   Abdominal   Peds  Hematology negative hematology ROS (+)   Anesthesia Other Findings Past Medical History: No date: Allergy 2013: Coronary artery disease     Comment:  a. [place] 2013 w/ severe 3-V CAD; b. s/p 4-V CABG in 2013               (LIMA-LAD, VG-D2, seq VG-PLA/PDA, VG-OM; c. LHC 2015               patent LIMA-LAD, occluded VGs, med Rx No date: Headache(784.0) No date: PAF (paroxysmal atrial fibrillation) ([place])     Comment:  a. noted post-operatively following CABG in 2013; b.               recurrence 06/2017; c. CHADS2VASc => 4 (HTN, age x 1, DM,               vascular disease); d. On Eliquis  Past Surgical History: 12/18/13: CARDIAC CATHETERIZATION     Comment:  Occluded SVGs 04/28/2012: CORONARY ARTERY BYPASS GRAFT     Comment:  Procedure: CORONARY ARTERY BYPASS GRAFTING (CABG);                Surgeon: Ivin Poot, MD;  Location: [place];  Service:              Open Heart Surgery;  Laterality: N/A;  Right saphenous               vein used for bypass grafting. Five  bypasses performed               including left mammary artery.  No date:  INGUINAL HERNIA REPAIR     Comment:  right  04/27/2012: LEFT HEART CATHETERIZATION WITH CORONARY ANGIOGRAM; N/A     Comment:  Procedure: LEFT HEART CATHETERIZATION WITH CORONARY               ANGIOGRAM;  Surgeon: Larey Dresser, MD;  [facility];  Service: Cardiovascular;  Laterality: N/A; 12/18/2013: LEFT HEART CATHETERIZATION WITH CORONARY/GRAFT ANGIOGRAM; N/ A     Comment:  Procedure: LEFT HEART CATHETERIZATION WITH               Beatrix Fetters;  Surgeon: Sinclair Grooms,               MD;  Location: [facility] CATH LAB;  Service: Cardiovascular;                Laterality: N/A;     Reproductive/Obstetrics negative OB ROS  Anesthesia Physical Anesthesia Plan  ASA: III  Anesthesia Plan: General   Post-op Pain Management:    Induction:   PONV Risk Score and Plan:   Airway Management Planned: Nasal Cannula  Additional Equipment:   Intra-op Plan:   Post-operative Plan:   Informed Consent: I have reviewed the patients History and Physical, chart, labs and discussed the procedure including the risks, benefits and alternatives for the proposed anesthesia with the patient or authorized representative who has indicated his/her understanding and acceptance.     Dental Advisory Given  Plan Discussed with: Anesthesiologist, CRNA and Surgeon  Anesthesia Plan Comments:         Anesthesia Quick Evaluation

## 2020-07-22 NOTE — Transfer of Care (Signed)
Immediate Anesthesia Transfer of Care Note  Patient: [name] [name]  Procedure(s) Performed: CARDIOVERSION (N/A )  Patient Location: Nursing Unit  Anesthesia Type:General  Level of Consciousness: drowsy and patient cooperative  Airway & Oxygen Therapy: Patient Spontanous Breathing and Patient connected to nasal cannula oxygen  Post-op Assessment: Report given to RN and Post -op Vital signs reviewed and stable  Post vital signs: Reviewed and stable  Last Vitals:  Vitals Value Taken Time  BP 99/65 07/22/20 0741  Temp    Pulse 54 07/22/20 0741  Resp 12 07/22/20 0741  SpO2 99 % 07/22/20 0741  Vitals shown include unvalidated device data.  Last Pain:  Vitals:   07/22/20 0659  TempSrc: Oral  PainSc: 0-No pain         Complications: No complications documented.

## 2020-07-23 ENCOUNTER — Encounter: Payer: Self-pay | Admitting: Internal Medicine

## 2020-07-23 NOTE — Anesthesia Postprocedure Evaluation (Signed)
Anesthesia Post Note  Patient: [name] [name]  Procedure(s) Performed: CARDIOVERSION (N/A )  Patient location during evaluation: PACU Anesthesia Type: General Level of consciousness: awake and alert Pain management: pain level controlled Vital Signs Assessment: post-procedure vital signs reviewed and stable Respiratory status: spontaneous breathing, nonlabored ventilation and respiratory function stable Cardiovascular status: blood pressure returned to baseline and stable Postop Assessment: no apparent nausea or vomiting Anesthetic complications: no   No complications documented.   Last Vitals:  Vitals:   07/22/20 0815 07/22/20 0830  BP: 100/64 (!) 91/56  Pulse: (!) 56 (!) 59  Resp: 15 14  Temp:    SpO2: 92% 93%    Last Pain:  Vitals:   07/22/20 0830  TempSrc:   PainSc: 0-No pain                 Brett Canales Ryleah Miramontes

## 2020-07-25 ENCOUNTER — Ambulatory Visit: Payer: Medicare HMO | Admitting: Family

## 2020-07-31 ENCOUNTER — Encounter: Payer: Self-pay | Admitting: Nurse Practitioner

## 2020-07-31 ENCOUNTER — Ambulatory Visit: Payer: Medicare HMO | Admitting: Nurse Practitioner

## 2020-07-31 ENCOUNTER — Other Ambulatory Visit: Payer: Self-pay

## 2020-07-31 VITALS — BP 120/70 | HR 48 | Ht 76.0 in | Wt 238.5 lb

## 2020-07-31 DIAGNOSIS — I5189 Other ill-defined heart diseases: Secondary | ICD-10-CM | POA: Insufficient documentation

## 2020-07-31 DIAGNOSIS — I2510 Atherosclerotic heart disease of native coronary artery without angina pectoris: Secondary | ICD-10-CM | POA: Diagnosis not present

## 2020-07-31 DIAGNOSIS — I480 Paroxysmal atrial fibrillation: Secondary | ICD-10-CM | POA: Diagnosis not present

## 2020-07-31 DIAGNOSIS — I4819 Other persistent atrial fibrillation: Secondary | ICD-10-CM | POA: Insufficient documentation

## 2020-07-31 DIAGNOSIS — I4891 Unspecified atrial fibrillation: Secondary | ICD-10-CM | POA: Insufficient documentation

## 2020-07-31 DIAGNOSIS — E119 Type 2 diabetes mellitus without complications: Secondary | ICD-10-CM | POA: Diagnosis not present

## 2020-07-31 DIAGNOSIS — E785 Hyperlipidemia, unspecified: Secondary | ICD-10-CM

## 2020-07-31 DIAGNOSIS — I10 Essential (primary) hypertension: Secondary | ICD-10-CM

## 2020-07-31 NOTE — Patient Instructions (Signed)
Medication Instructions:  Your physician recommends that you continue on your current medications as directed. Please refer to the Current Medication list given to you today.  *If you need a refill on your cardiac medications before your next appointment, please call your pharmacy*  Follow-Up: At [facility], you and your health needs are our priority.  As part of our continuing mission to provide you with exceptional heart care, we have created designated Provider Care Teams.  These Care Teams include your primary Cardiologist (physician) and Advanced Practice Providers (APPs -  Physician Assistants and Nurse Practitioners) who all work together to provide you with the care you need, when you need it.  We recommend signing up for the patient portal called "MyChart".  Sign up information is provided on this After Visit Summary.  MyChart is used to connect with patients for Virtual Visits (Telemedicine).  Patients are able to view lab/test results, encounter notes, upcoming appointments, etc.  Non-urgent messages can be sent to your provider as well.   To learn more about what you can do with MyChart, go to [email or web address].    Your next appointment:   3 month(s)  The format for your next appointment:   In Person  Provider:   You may see Nelva Bush, MD or one of the following Advanced Practice Providers on your designated Care Team:    Cadence Northrop, [place]

## 2020-07-31 NOTE — Progress Notes (Signed)
Office Visit    Patient Name: [name] [name] Date of Encounter: 07/31/2020  Primary Care Provider:  Olin Hauser, DO Primary Cardiologist:  Nelva Bush, MD  Chief Complaint    70 year old male with a history of coronary artery disease status post four-vessel bypass in 1017, diastolic dysfunction, hypertension, hyperlipidemia, type 2 diabetes mellitus, and paroxysmal atrial fibrillation, who presents for follow-up after recent cardioversion.  Past Medical History    Past Medical History:  Diagnosis Date  . Allergy   . Coronary artery disease 2013   a. [place] 2013 w/ severe 3-V CAD; b. s/p 4-V CABG in 2013 (LIMA-LAD, VG-D2, seq VG-PLA/PDA, VG-OM; c. LHC 2015 patent LIMA-LAD, occluded VGs, med Rx  . Diastolic dysfunction    a. 07/2017 Echo: eF 50-55%, mild LVH. no rwma, Gr2 DD. Mildly dil LA/RA. Nl RV size/fxn.  . Essential hypertension   . Headache(784.0)   . Hyperlipidemia LDL goal <70   . PAF (paroxysmal atrial fibrillation) ([place])    a. noted post-operatively following CABG in 2013; b. recurrence 06/2017; c. CHADS2VASc => 4 (HTN, age x 1, DM, vascular disease)--> Eliquis; d. 07/2020 recurrent AFib-> DCCV (200J).  . Type II diabetes mellitus ([place])    Past Surgical History:  Procedure Laterality Date  . CARDIAC CATHETERIZATION  12/18/13   Occluded SVGs  . CARDIOVERSION N/A 07/22/2020   Procedure: CARDIOVERSION;  Surgeon: Nelva Bush, MD;  Location: [facility];  Service: Cardiovascular;  Laterality: N/A;  . CORONARY ARTERY BYPASS GRAFT  04/28/2012   Procedure: CORONARY ARTERY BYPASS GRAFTING (CABG);  Surgeon: Ivin Poot, MD;  Location: [place];  Service: Open Heart Surgery;  Laterality: N/A;  Right saphenous vein used for bypass grafting. Five  bypasses performed including left mammary artery.   . INGUINAL HERNIA REPAIR     right   . LEFT HEART CATHETERIZATION WITH CORONARY ANGIOGRAM N/A 04/27/2012   Procedure: LEFT HEART CATHETERIZATION WITH CORONARY ANGIOGRAM;   Surgeon: Larey Dresser, MD;  Location[facility] CATH LAB;  Service: Cardiovascular;  Laterality: N/A;  . LEFT HEART CATHETERIZATION WITH CORONARY/GRAFT ANGIOGRAM N/A 12/18/2013   Procedure: LEFT HEART CATHETERIZATION WITH Beatrix Fetters;  Surgeon: Sinclair Grooms, MD;  Location: [facility] C[facility] LAB;  Service: Cardiovascular;  Laterality: N/A;    Allergies  Allergies  Allergen Reactions  . Penicillins Hives, Itching and Rash    History of Present Illness    70 year old male with above past medical history including coronary artery disease status post four-vessel bypass in 5102, diastolic dysfunction, hypertension, hyperlipidemia, type 2 diabetes mellitus, and paroxysmal atrial fibrillation.  His last diagnostic catheterization was performed in 2015 revealing a patent LIMA to the LAD with occluded vein grafts.  He has been medically managed since.  At office follow-up in January, he was noted to be in asymptomatic atrial fibrillation.  Follow-up lab work was unremarkable including normal CBC, electrolytes, renal function, and TSH.  He remained in atrial fib/flutter at follow-up on February 4 and arrangements were made for cardioversion which took place on February 8, and was successful (200 J).  Following cardioversion, he was bradycardic and metoprolol dose was decreased to 25 mg twice daily.  Since his cardioversion, he has felt well. He has been walking 12-7998 steps a day in his basement without symptoms or limitations. He does monitor his heart rate and rates are typically in the high 40s to low 50s at rest and will rise into the 1 teens with activity. He denies fatigue, chest pain, dyspnea,  palpitations, PND, orthopnea, dizziness, syncope, edema, or early satiety.  Home Medications    Prior to Admission medications   Medication Sig Start Date End Date Taking? Authorizing Provider  acetaminophen (TYLENOL) 325 MG tablet Take 650 mg by mouth every 6 (six) hours as needed for headache.     [provider]  apixaban (ELIQUIS) 5 MG TABS tablet Take 1 tablet (5 mg total) by mouth 2 (two) times daily. 07/17/20   End, Harrell Gave, MD  aspirin EC 81 MG tablet Take 81 mg by mouth daily.    [provider]  atorvastatin (LIPITOR) 80 MG tablet TAKE 1 TABLET BY MOUTH AT 6PM AS DIRECTED Patient taking differently: Take 80 mg by mouth every evening. TAKE 1 TABLET BY MOUTH AT 6PM AS DIRECTED 06/14/19   End, Harrell Gave, MD  Coenzyme Q10 (CO Q 10) 100 MG CAPS Take 200 mg by mouth daily. 10/23/12   Larey Dresser, MD  fexofenadine (ALLEGRA) 180 MG tablet Take 180 mg by mouth daily.    [provider]  fish oil-omega-3 fatty acids 1000 MG capsule Take 1 g by mouth daily.    [provider]  isosorbide mononitrate (IMDUR) 30 MG 24 hr tablet TAKE 1 TABLET BY MOUTH EVERY DAY Patient taking differently: Take 30 mg by mouth daily. 07/15/20 08/14/20  End, Harrell Gave, MD  lisinopril (ZESTRIL) 10 MG tablet TAKE 1 TABLET BY MOUTH EVERY DAY Patient taking differently: Take 10 mg by mouth daily. 07/15/20   End, Harrell Gave, MD  metFORMIN (GLUCOPHAGE) 500 MG tablet Take 1 tablet (500 mg total) by mouth 2 (two) times daily with a meal. 03/19/20   [name], Devonne Doughty, DO  metoprolol tartrate (LOPRESSOR) 50 MG tablet Take 0.5 tablets (25 mg total) by mouth 2 (two) times daily. 07/22/20   End, Harrell Gave, MD  nitroGLYCERIN (NITROSTAT) 0.4 MG SL tablet Place 1 tablet (0.4 mg total) under the tongue every 5 (five) minutes as needed for chest pain. 06/14/19   End, Harrell Gave, MD    Review of Systems    Doing well status post cardioversion.  He denies chest pain, palpitations, dyspnea, pnd, orthopnea, n, v, dizziness, syncope, edema, weight gain, or early satiety.  All other systems reviewed and are otherwise negative except as noted above.  Physical Exam    VS:  BP 120/70 (BP Location: Left Arm, Patient Position: Sitting, Cuff Size: Normal)   Pulse (!) 48   Ht 6\' 4"  (1.93 m)    Wt 238 lb 8 oz (108.2 kg)   SpO2 98%   BMI 29.03 kg/m  , BMI Body mass index is 29.03 kg/m. GEN: Well nourished, well developed, in no acute distress. HEENT: normal. Neck: Supple, no JVD, carotid bruits, or masses. Cardiac: RRR, no murmurs, rubs, or gallops. No clubbing, cyanosis, edema.  Radials/PT 2+ and equal bilaterally.  Respiratory:  Respirations regular and unlabored, clear to auscultation bilaterally. GI: Soft, nontender, nondistended, BS + x 4. MS: no deformity or atrophy. Skin: warm and dry, no rash. Neuro:  Strength and sensation are intact. Psych: Normal affect.  Accessory Clinical Findings    ECG personally reviewed by me today -sinus bradycardia, 48, left axis deviation, left anterior fascicular block, LVH, poor R wave progression- no acute changes.  Lab Results  Component Value Date   WBC 7.4 07/03/2020   HGB 14.6 07/03/2020   HCT 43.3 07/03/2020   MCV 90 07/03/2020   PLT 209 07/03/2020   Lab Results  Component Value Date   CREATININE 1.23 07/03/2020  BUN 22 07/03/2020   NA 136 07/03/2020   K 5.2 07/03/2020   CL 99 07/03/2020   CO2 24 07/03/2020   Lab Results  Component Value Date   ALT 36 07/03/2020   AST 33 07/03/2020   ALKPHOS 98 07/03/2020   BILITOT 0.6 07/03/2020   Lab Results  Component Value Date   CHOL 116 09/12/2019   HDL 44 09/12/2019   LDLCALC 60 09/12/2019   TRIG 50 09/12/2019   CHOLHDL 2.6 09/12/2019    Lab Results  Component Value Date   HGBA1C 7.2 (A) 03/19/2020    Assessment & Plan    1. Paroxysmal atrial fibrillation: Patient noted to be in atrial fibrillation in January and subsequently underwent cardioversion last week. He has been maintaining sinus rhythm and monitors his heart rates at home. Resting rates are typically in the high 40s to low 50s with rates rising into the 1 teens with activity. He has been walking 12-7998 steps a day without symptoms or limitations. He remains in sinus rhythm today at a rate of 48 bpm.  Given the lack of symptoms and evidence of good chronotropic response, I will continue his current dose of metoprolol therapy at 25 mg twice daily. He understands that if he develops symptoms such as worsening fatigue or presyncope, or is noting heart rates in the 30s to low 40s, that we would want to reduce his beta-blocker dose further. He remains anticoagulated with Eliquis therapy.  2. Coronary artery disease: Status post four-vessel bypass in 2013 with patent LIMA to the LAD on catheterization 2015. His vein grafts were occluded. He has been medically managed since. He has not been experiencing any angina and is walking 7000-8000 steps a day without symptoms or limitations. He remains on low-dose aspirin, statin, nitrate, beta-blocker, and ACE inhibitor therapy.  3. Hyperlipidemia: LDL of 60 in March 2021. He remains on statin therapy. He is due for annual labs in March.  4. Essential hypertension: Stable at 120/70 on beta-blocker, ACE inhibitor, and nitrate therapy.  5. Type 2 diabetes mellitus: A1c 7.2 in October 2021. He is on Metformin therapy and followed by primary care.  6. Disposition: Follow-up in clinic in 3 months or sooner if necessary.   Murray Hodgkins, NP 07/31/2020, 9:29 AM

## 2020-08-01 ENCOUNTER — Ambulatory Visit: Payer: Medicare HMO | Admitting: Nurse Practitioner

## 2020-08-12 ENCOUNTER — Other Ambulatory Visit: Payer: Self-pay | Admitting: Internal Medicine

## 2020-08-12 DIAGNOSIS — I10 Essential (primary) hypertension: Secondary | ICD-10-CM

## 2020-08-12 DIAGNOSIS — I480 Paroxysmal atrial fibrillation: Secondary | ICD-10-CM

## 2020-08-19 ENCOUNTER — Other Ambulatory Visit: Payer: Self-pay | Admitting: Internal Medicine

## 2020-08-19 DIAGNOSIS — I10 Essential (primary) hypertension: Secondary | ICD-10-CM

## 2020-08-19 DIAGNOSIS — I480 Paroxysmal atrial fibrillation: Secondary | ICD-10-CM

## 2020-08-20 ENCOUNTER — Telehealth: Payer: Self-pay | Admitting: Internal Medicine

## 2020-08-20 ENCOUNTER — Telehealth: Payer: Self-pay | Admitting: Family Medicine

## 2020-08-20 DIAGNOSIS — H9113 Presbycusis, bilateral: Secondary | ICD-10-CM

## 2020-08-20 NOTE — Telephone Encounter (Signed)
Pt wife is calling and would like to know if dr Raliegh Ip would written a letter to have patient excuse from jury duty due to pt is hard of hearing and his nerves is shot

## 2020-08-20 NOTE — Telephone Encounter (Signed)
Letter written for [employer], printed, signed.  Please notify patient that it is ready to be picked up from front office anytime during business hours.  Nobie Putnam, [facility] 08/20/2020, 5:02 PM

## 2020-08-20 NOTE — Telephone Encounter (Signed)
The pt wife was notified of dr. Raliegh Ip recommendation. She verbalize understanding. She said she will bring the summons letter by the office shortly.

## 2020-08-20 NOTE — Telephone Encounter (Signed)
Patients wife calling in stating patient has upcoming jury duty in April. Wife is wanting to know if Dr. Saunders Revel would be able to write a note to dismiss patient from [place] due to recent cardioversion and some anxiety  Please advise

## 2020-08-20 NOTE — Telephone Encounter (Signed)
Alright. I can write the letter. He needs to submit the original Jury Duty Summons paper to [place] and I can write/print a new letter and he can pick up from office once ready.  Usually it may take 1-3 days to write this so, they should bring it as far in advance if possible.  Thanks  Nobie Putnam, [facility] 08/20/2020, 12:06 PM

## 2020-08-20 NOTE — Telephone Encounter (Signed)
Notified patient's wife, ok per DPR. Advised her to check with PCP about a letter and she was understanding and very appreciative.

## 2020-08-21 NOTE — Telephone Encounter (Signed)
Message left on pt's cell phone informing him that the letter is ready to be picked up at our office.

## 2020-09-10 ENCOUNTER — Other Ambulatory Visit: Payer: Self-pay | Admitting: *Deleted

## 2020-09-10 DIAGNOSIS — E663 Overweight: Secondary | ICD-10-CM

## 2020-09-10 DIAGNOSIS — E1169 Type 2 diabetes mellitus with other specified complication: Secondary | ICD-10-CM

## 2020-09-10 DIAGNOSIS — N183 Chronic kidney disease, stage 3 unspecified: Secondary | ICD-10-CM

## 2020-09-10 DIAGNOSIS — E1121 Type 2 diabetes mellitus with diabetic nephropathy: Secondary | ICD-10-CM

## 2020-09-10 DIAGNOSIS — Z0000 Encounter for general adult medical examination without abnormal findings: Secondary | ICD-10-CM

## 2020-09-10 DIAGNOSIS — N1831 Chronic kidney disease, stage 3a: Secondary | ICD-10-CM

## 2020-09-10 DIAGNOSIS — R351 Nocturia: Secondary | ICD-10-CM

## 2020-09-10 DIAGNOSIS — R7309 Other abnormal glucose: Secondary | ICD-10-CM

## 2020-09-10 DIAGNOSIS — I129 Hypertensive chronic kidney disease with stage 1 through stage 4 chronic kidney disease, or unspecified chronic kidney disease: Secondary | ICD-10-CM

## 2020-09-10 DIAGNOSIS — I25708 Atherosclerosis of coronary artery bypass graft(s), unspecified, with other forms of angina pectoris: Secondary | ICD-10-CM

## 2020-09-11 ENCOUNTER — Other Ambulatory Visit: Payer: Medicare HMO

## 2020-09-11 ENCOUNTER — Other Ambulatory Visit: Payer: Self-pay

## 2020-09-11 DIAGNOSIS — N183 Chronic kidney disease, stage 3 unspecified: Secondary | ICD-10-CM | POA: Diagnosis not present

## 2020-09-11 DIAGNOSIS — E1121 Type 2 diabetes mellitus with diabetic nephropathy: Secondary | ICD-10-CM | POA: Diagnosis not present

## 2020-09-11 DIAGNOSIS — E1169 Type 2 diabetes mellitus with other specified complication: Secondary | ICD-10-CM | POA: Diagnosis not present

## 2020-09-11 DIAGNOSIS — R351 Nocturia: Secondary | ICD-10-CM | POA: Diagnosis not present

## 2020-09-11 DIAGNOSIS — I129 Hypertensive chronic kidney disease with stage 1 through stage 4 chronic kidney disease, or unspecified chronic kidney disease: Secondary | ICD-10-CM | POA: Diagnosis not present

## 2020-09-11 DIAGNOSIS — R7309 Other abnormal glucose: Secondary | ICD-10-CM | POA: Diagnosis not present

## 2020-09-11 DIAGNOSIS — E785 Hyperlipidemia, unspecified: Secondary | ICD-10-CM | POA: Diagnosis not present

## 2020-09-11 DIAGNOSIS — Z0000 Encounter for general adult medical examination without abnormal findings: Secondary | ICD-10-CM | POA: Diagnosis not present

## 2020-09-11 DIAGNOSIS — I25708 Atherosclerosis of coronary artery bypass graft(s), unspecified, with other forms of angina pectoris: Secondary | ICD-10-CM | POA: Diagnosis not present

## 2020-09-11 DIAGNOSIS — E663 Overweight: Secondary | ICD-10-CM | POA: Diagnosis not present

## 2020-09-12 LAB — COMPLETE METABOLIC PANEL WITH GFR
AG Ratio: 1.7 (calc) (ref 1.0–2.5)
ALT: 26 U/L (ref 9–46)
AST: 25 U/L (ref 10–35)
Albumin: 4.3 g/dL (ref 3.6–5.1)
Alkaline phosphatase (APISO): 92 U/L (ref 35–144)
BUN: 20 mg/dL (ref 7–25)
CO2: 30 mmol/L (ref 20–32)
Calcium: 9.8 mg/dL (ref 8.6–10.3)
Chloride: 102 mmol/L (ref 98–110)
Creat: 1.16 mg/dL (ref 0.70–1.25)
GFR, Est African American: 75 mL/min/{1.73_m2} (ref >=60)
GFR, Est Non African American: 64 mL/min/{1.73_m2} (ref >=60)
Globulin: 2.6 g/dL (calc) (ref 1.9–3.7)
Glucose, Bld: 135 mg/dL — ABNORMAL HIGH (ref 65–99)
Potassium: 4.1 mmol/L (ref 3.5–5.3)
Sodium: 140 mmol/L (ref 135–146)
Total Bilirubin: 0.7 mg/dL (ref 0.2–1.2)
Total Protein: 6.9 g/dL (ref 6.1–8.1)

## 2020-09-12 LAB — CBC WITH DIFFERENTIAL/PLATELET
Absolute Monocytes: 616 cells/uL (ref 200–950)
Basophils Absolute: 23 cells/uL (ref 0–200)
Basophils Relative: 0.3 %
Eosinophils Absolute: 631 cells/uL — ABNORMAL HIGH (ref 15–500)
Eosinophils Relative: 8.2 %
HCT: 40.5 % (ref 38.5–50.0)
Hemoglobin: 13.4 g/dL (ref 13.2–17.1)
Lymphs Abs: 1871 cells/uL (ref 850–3900)
MCH: 30.7 pg (ref 27.0–33.0)
MCHC: 33.1 g/dL (ref 32.0–36.0)
MCV: 92.9 fL (ref 80.0–100.0)
MPV: 12.2 fL (ref 7.5–12.5)
Monocytes Relative: 8 %
Neutro Abs: 4558 cells/uL (ref 1500–7800)
Neutrophils Relative %: 59.2 %
Platelets: 207 10*3/uL (ref 140–400)
RBC: 4.36 10*6/uL (ref 4.20–5.80)
RDW: 12.4 % (ref 11.0–15.0)
Total Lymphocyte: 24.3 %
WBC: 7.7 10*3/uL (ref 3.8–10.8)

## 2020-09-12 LAB — LIPID PANEL
Cholesterol: 95 mg/dL (ref <200)
HDL: 31 mg/dL — ABNORMAL LOW (ref >=40)
LDL Cholesterol (Calc): 50 mg/dL (calc)
Non-HDL Cholesterol (Calc): 64 mg/dL (calc) (ref <130)
Total CHOL/HDL Ratio: 3.1 (calc) (ref <5.0)
Triglycerides: 67 mg/dL (ref <150)

## 2020-09-12 LAB — TSH: TSH: 2.87 mIU/L (ref 0.40–4.50)

## 2020-09-12 LAB — HEMOGLOBIN A1C
Hgb A1c MFr Bld: 7.2 % of total Hgb — ABNORMAL HIGH (ref <5.7)
Mean Plasma Glucose: 160 mg/dL
eAG (mmol/L): 8.9 mmol/L

## 2020-09-12 LAB — PSA: PSA: 2.26 ng/mL (ref <=4.0)

## 2020-09-17 ENCOUNTER — Encounter: Payer: Self-pay | Admitting: Family Medicine

## 2020-09-17 ENCOUNTER — Other Ambulatory Visit: Payer: Self-pay

## 2020-09-17 ENCOUNTER — Other Ambulatory Visit: Payer: Self-pay | Admitting: Family Medicine

## 2020-09-17 ENCOUNTER — Ambulatory Visit (INDEPENDENT_AMBULATORY_CARE_PROVIDER_SITE_OTHER): Payer: Medicare HMO | Admitting: Family Medicine

## 2020-09-17 VITALS — BP 138/60 | HR 50 | Ht 76.0 in | Wt 233.4 lb

## 2020-09-17 DIAGNOSIS — N1831 Chronic kidney disease, stage 3a: Secondary | ICD-10-CM

## 2020-09-17 DIAGNOSIS — I129 Hypertensive chronic kidney disease with stage 1 through stage 4 chronic kidney disease, or unspecified chronic kidney disease: Secondary | ICD-10-CM | POA: Diagnosis not present

## 2020-09-17 DIAGNOSIS — E663 Overweight: Secondary | ICD-10-CM

## 2020-09-17 DIAGNOSIS — I25708 Atherosclerosis of coronary artery bypass graft(s), unspecified, with other forms of angina pectoris: Secondary | ICD-10-CM

## 2020-09-17 DIAGNOSIS — Z0000 Encounter for general adult medical examination without abnormal findings: Secondary | ICD-10-CM

## 2020-09-17 DIAGNOSIS — N183 Chronic kidney disease, stage 3 unspecified: Secondary | ICD-10-CM

## 2020-09-17 DIAGNOSIS — E785 Hyperlipidemia, unspecified: Secondary | ICD-10-CM | POA: Diagnosis not present

## 2020-09-17 DIAGNOSIS — I4891 Unspecified atrial fibrillation: Secondary | ICD-10-CM

## 2020-09-17 DIAGNOSIS — E1169 Type 2 diabetes mellitus with other specified complication: Secondary | ICD-10-CM

## 2020-09-17 DIAGNOSIS — E1121 Type 2 diabetes mellitus with diabetic nephropathy: Secondary | ICD-10-CM

## 2020-09-17 DIAGNOSIS — R351 Nocturia: Secondary | ICD-10-CM

## 2020-09-17 NOTE — Assessment & Plan Note (Addendum)
Controlled HTN Complicated by CAD, CKD-III  Improved Creatinine    Plan:  1. Continue current BP regimen - Metoprolol 25mg  BID, Lisiniopril 10mg  daily, Isosorbide mononite 2. Encourage improved lifestyle - low sodium diet, regular exercise 3. Continue monitor BP outside office, bring readings to next visit, if persistently >140/90 or new symptoms notify office sooner

## 2020-09-17 NOTE — Patient Instructions (Addendum)
Thank you for coming to the office today.  Keep up the great work overall!  No change to medications  Labs look great.  Recent Labs    03/19/20 0821 09/11/20 0759  HGBA1C 7.2* 7.2*    PSA slightly elevated to 2.2, it increased since last time, we can double check it again in 6 months.  DUE for NON FASTING BLOOD WORK   SCHEDULE "Lab Only" visit in the morning at the clinic for lab draw in 6 MONTHS   - Make sure Lab Only appointment is at about 1 week before your next appointment, so that results will be available  For Lab Results, once available within 2-3 days of blood draw, you can can log in to MyChart online to view your results and a brief explanation. Also, we can discuss results at next follow-up visit.    Please schedule a Follow-up Appointment to: Return in about 6 months (around 03/19/2021) for 6 month follow-up non fasting lab then 1 week later Follow-up DM, HTN, PSA.  If you have any other questions or concerns, please feel free to call the office or send a message through [place]. You may also schedule an earlier appointment if necessary.  Additionally, you may be receiving a survey about your experience at our office within a few days to 1 week by e-mail or mail. We value your feedback.  Nobie Putnam, DO [place]

## 2020-09-17 NOTE — Assessment & Plan Note (Addendum)
Stable, without anginal symptoms Controlled on Imdur and med management Followed by [facility]

## 2020-09-17 NOTE — Assessment & Plan Note (Signed)
Well controlled cholesterol on statin  Last lipid panel 09/2020 Known CAD CABG  Plan: 1. Continue current meds - Atorvastatin 80mg daily, Fish Oil omega 3 2. Continue ASA 81mg for secondary ASCVD risk reduction 3. Encourage improved lifestyle - low carb/cholesterol, reduce portion size, continue improving regular exercise 

## 2020-09-17 NOTE — Progress Notes (Signed)
Subjective:    Patient ID: [name] [name], male    DOB: Mar 19, 1951, 70 y.o.   MRN: [id number]  [name] [name] is a 70 y.o. male presenting on 09/17/2020 for Annual Exam and Coronary Artery Disease   HPI  Here for Annual Physical and Lab Review.  Type 2 Diabetes, with Nephropathy CKD III See prior notes for background information. - Todaydoing well Last lab shows Creatinine 1.16, improved from prior 1.2 to 1.4 range. CBGs: No CBGs Meds:Metformin 500mg  BID - tolerating well without side effects Currently on ACEi Lifestyle: Weight down 5 lbs in past few months - Diet (improving diabetic diet, add green tea, adding more water to daily diet) - Exercise (improved exerciseoutdoors, yard workhe is tolerating exertion even better,stationarybike) - Father with diabetes Needs to schedule DM Eye Exam. Denies hypoglycemia, visual changes, numbness or tingling, polyuria  CHRONIC HTN: Reportsno new concerns Follows with Cardiology Recently BB reduced from 50 BID down to 25 BID He still has chronic bradycardia. Current Meds -Lisinopril10mg  daily, Imdur 30mg  daily, Metoprolol 25mg  BID Reports good compliance, took meds today. Tolerating well, w/o complaints. Denies CP, dyspnea, HA, edema, dizziness / lightheadedness  CAD s/p CABG 2013 LHC with severe 3 vessel disease/ PAF Followed [name] Recent updates in 07/2020 he had cardioversion for paroxysmal atrial fibrillation, very successful procedure, he has maintained sinus rhythm since Continues on anticoagulation - Taking Eliquis 5mg  BID, high intensity statin, ASA, omega 3 fish oil, imdur, ACEi, BB - has NTG SL. Not using it. - He is still on Metoprolol 25mg  BID. If symptomatic with bradycardia he may reduce BB dose in future.  HYPERLIPIDEMIA: - Reports no concerns. Last lipid panel 09/2020, controlled, with LDL 50 - Currently takingAtorvastatin 80mg  daily, tolerating well without side effects or  myalgias  Health Maintenance: [facility] - Dr [name] [name] - Colonoscopy 05/2019 - tubular adenoma x 3 - benign, next repeat in 3 years 05/2022  UTD COVID Moderna  PSA screening last result 2.26 (09/2020), previous range 0.9 to 1.4  Depression screen [facility] 2/9 03/19/2020 04/18/2019 01/15/2019  Decreased Interest 0 0 0  Down, Depressed, Hopeless 0 0 0  PHQ - 2 Score 0 0 0    Past Medical History:  Diagnosis Date  . Allergy   . Coronary artery disease 2013   a. [name] [name] 2013 w/ severe 3-V CAD; b. s/p 4-V CABG in 2013 (LIMA-LAD, VG-D2, seq VG-PLA/PDA, VG-OM; c. LHC 2015 patent LIMA-LAD, occluded VGs, med Rx  . Diastolic dysfunction    a. 07/2017 Echo: eF 50-55%, mild LVH. no rwma, Gr2 DD. Mildly dil LA/RA. Nl RV size/fxn.  . Essential hypertension   . Headache(784.0)   . Hyperlipidemia LDL goal <70   . PAF (paroxysmal atrial fibrillation) ([place])    a. noted post-operatively following CABG in 2013; b. recurrence 06/2017; c. CHADS2VASc => 4 (HTN, age x 1, DM, vascular disease)--> Eliquis; d. 07/2020 recurrent AFib-> DCCV (200J).  . Type II diabetes mellitus ([place])    Past Surgical History:  Procedure Laterality Date  . CARDIAC CATHETERIZATION  12/18/13   Occluded SVGs  . CARDIOVERSION N/A 07/22/2020   Procedure: CARDIOVERSION;  Surgeon: [name] Bush, MD;  Location: [facility] ORS;  Service: Cardiovascular;  Laterality: N/A;  . CORONARY ARTERY BYPASS GRAFT  04/28/2012   Procedure: CORONARY ARTERY BYPASS GRAFTING (CABG);  Surgeon: [name] Poot, MD;  Location: [place];  Service: Open Heart Surgery;  Laterality: N/A;  Right saphenous vein used for bypass grafting. Five  bypasses performed  including left mammary artery.   . INGUINAL HERNIA REPAIR     right   . LEFT HEART CATHETERIZATION WITH CORONARY ANGIOGRAM N/A 04/27/2012   Procedure: LEFT HEART CATHETERIZATION WITH CORONARY ANGIOGRAM;  Surgeon: Larey Dresser, MD;  Location: [facility] CATH LAB;  Service: Cardiovascular;  Laterality: N/A;  . LEFT HEART  CATHETERIZATION WITH CORONARY/GRAFT ANGIOGRAM N/A 12/18/2013   Procedure: LEFT HEART CATHETERIZATION WITH Beatrix Fetters;  Surgeon: Sinclair Grooms, MD;  Location: [facility] CATH LAB;  Service: Cardiovascular;  Laterality: N/A;   Social History   Socioeconomic History  . Marital status: Married    Spouse name: Not on file  . Number of children: Not on file  . Years of education: Not on file  . Highest education level: Not on file  Occupational History  . Not on file  Tobacco Use  . Smoking status: Never Smoker  . Smokeless tobacco: Never Used  Vaping Use  . Vaping Use: Never used  Substance and Sexual Activity  . Alcohol use: Not Currently  . Drug use: No  . Sexual activity: Yes  Other Topics Concern  . Not on file  Social History Narrative  . Not on file   Social Determinants of Health   Financial Resource Strain: Not on file  Food Insecurity: Not on file  Transportation Needs: Not on file  Physical Activity: Not on file  Stress: Not on file  Social Connections: Not on file  Intimate Partner Violence: Not on file   Family History  Problem Relation Age of Onset  . CAD Mother   . Heart attack Mother   . CAD Father   . Diabetes Father   . Heart attack Father   . Colon cancer Neg Hx   . Prostate cancer Neg Hx    Current Outpatient Medications on File Prior to Visit  Medication Sig  . acetaminophen (TYLENOL) 325 MG tablet Take 650 mg by mouth every 6 (six) hours as needed for headache.  Marland Kitchen apixaban (ELIQUIS) 5 MG TABS tablet Take 1 tablet (5 mg total) by mouth 2 (two) times daily.  Marland Kitchen aspirin EC 81 MG tablet Take 81 mg by mouth daily.  Marland Kitchen atorvastatin (LIPITOR) 80 MG tablet TAKE 1 TABLET BY MOUTH AT 6PM AS DIRECTED  . Coenzyme Q10 (CO Q 10) 100 MG CAPS Take 200 mg by mouth daily.  . fexofenadine (ALLEGRA) 180 MG tablet Take 180 mg by mouth daily.  . fish oil-omega-3 fatty acids 1000 MG capsule Take 1 g by mouth daily.  Marland Kitchen lisinopril (ZESTRIL) 10 MG tablet TAKE 1  TABLET BY MOUTH EVERY DAY  . metFORMIN (GLUCOPHAGE) 500 MG tablet Take 1 tablet (500 mg total) by mouth 2 (two) times daily with a meal.  . metoprolol tartrate (LOPRESSOR) 50 MG tablet Take 0.5 tablets (25 mg total) by mouth 2 (two) times daily.  . nitroGLYCERIN (NITROSTAT) 0.4 MG SL tablet Place 1 tablet (0.4 mg total) under the tongue every 5 (five) minutes as needed for chest pain.  . isosorbide mononitrate (IMDUR) 30 MG 24 hr tablet TAKE 1 TABLET BY MOUTH EVERY DAY   No current facility-administered medications on file prior to visit.    Review of Systems  Constitutional: Negative for activity change, appetite change, chills, diaphoresis, fatigue and fever.  HENT: Positive for hearing loss. Negative for congestion.   Eyes: Negative for visual disturbance.  Respiratory: Negative for apnea, cough, chest tightness, shortness of breath and wheezing.   Cardiovascular: Negative for chest pain, palpitations  and leg swelling.  Gastrointestinal: Negative for abdominal pain, constipation, diarrhea, nausea and vomiting.  Endocrine: Negative for cold intolerance.  Genitourinary: Negative for difficulty urinating, dysuria, frequency and hematuria.  Musculoskeletal: Negative for arthralgias and neck pain.  Skin: Negative for rash.  Allergic/Immunologic: Negative for environmental allergies.  Neurological: Negative for dizziness, weakness, light-headedness, numbness and headaches.  Hematological: Negative for adenopathy.  Psychiatric/Behavioral: Negative for behavioral problems, dysphoric mood and sleep disturbance. The patient is not nervous/anxious.    Per HPI unless specifically indicated above      Objective:    BP 138/60 (BP Location: Left Arm, Cuff Size: Normal)   Pulse (!) 50   Ht 6\' 4"  (1.93 m)   Wt 233 lb 6.4 oz (105.9 kg)   SpO2 100%   BMI 28.41 kg/m   Wt Readings from Last 3 Encounters:  09/17/20 233 lb 6.4 oz (105.9 kg)  07/31/20 238 lb 8 oz (108.2 kg)  07/22/20 240 lb  (108.9 kg)    Physical Exam Vitals and nursing note reviewed.  Constitutional:      General: He is not in acute distress.    Appearance: He is well-developed. He is not diaphoretic.     Comments: Well-appearing, comfortable, cooperative  HENT:     Head: Normocephalic and atraumatic.  Eyes:     General:        Right eye: No discharge.        Left eye: No discharge.     Conjunctiva/sclera: Conjunctivae normal.     Pupils: Pupils are equal, round, and reactive to light.  Neck:     Thyroid: No thyromegaly.     Vascular: No carotid bruit.  Cardiovascular:     Rate and Rhythm: Normal rate and regular rhythm.     Heart sounds: Normal heart sounds. No murmur heard.   Pulmonary:     Effort: Pulmonary effort is normal. No respiratory distress.     Breath sounds: Normal breath sounds. No wheezing or rales.  Abdominal:     General: Bowel sounds are normal. There is no distension.     Palpations: Abdomen is soft. There is no mass.     Tenderness: There is no abdominal tenderness.  Musculoskeletal:        General: No tenderness. Normal range of motion.     Cervical back: Normal range of motion and neck supple.     Right lower leg: No edema.     Left lower leg: No edema.     Comments: Upper / Lower Extremities: - Normal muscle tone, strength bilateral upper extremities 5/5, lower extremities 5/5  Lymphadenopathy:     Cervical: No cervical adenopathy.  Skin:    General: Skin is warm and dry.     Findings: No erythema or rash.  Neurological:     Mental Status: He is alert and oriented to person, place, and time.     Comments: Distal sensation intact to light touch all extremities  Psychiatric:        Behavior: Behavior normal.     Comments: Well groomed, good eye contact, normal speech and thoughts    Results for orders placed or performed in visit on 09/10/20  TSH  Result Value Ref Range   TSH 2.87 0.40 - 4.50 mIU/L  PSA  Result Value Ref Range   PSA 2.26 < OR = 4.0 ng/mL   Lipid panel  Result Value Ref Range   Cholesterol 95 <200 mg/dL   HDL 31 (L) > OR =  40 mg/dL   Triglycerides 67 <150 mg/dL   LDL Cholesterol (Calc) 50 mg/dL (calc)   Total CHOL/HDL Ratio 3.1 <5.0 (calc)   Non-HDL Cholesterol (Calc) 64 <130 mg/dL (calc)  COMPLETE METABOLIC PANEL WITH GFR  Result Value Ref Range   Glucose, Bld 135 (H) 65 - 99 mg/dL   BUN 20 7 - 25 mg/dL   Creat 1.16 0.70 - 1.25 mg/dL   GFR, Est Non African American 64 > OR = 60 mL/min/1.98m2   GFR, Est African American 75 > OR = 60 mL/min/1.42m2   BUN/Creatinine Ratio NOT APPLICABLE 6 - 22 (calc)   Sodium 140 135 - 146 mmol/L   Potassium 4.1 3.5 - 5.3 mmol/L   Chloride 102 98 - 110 mmol/L   CO2 30 20 - 32 mmol/L   Calcium 9.8 8.6 - 10.3 mg/dL   Total Protein 6.9 6.1 - 8.1 g/dL   Albumin 4.3 3.6 - 5.1 g/dL   Globulin 2.6 1.9 - 3.7 g/dL (calc)   AG Ratio 1.7 1.0 - 2.5 (calc)   Total Bilirubin 0.7 0.2 - 1.2 mg/dL   Alkaline phosphatase (APISO) 92 35 - 144 U/L   AST 25 10 - 35 U/L   ALT 26 9 - 46 U/L  CBC with Differential/Platelet  Result Value Ref Range   WBC 7.7 3.8 - 10.8 Thousand/uL   RBC 4.36 4.20 - 5.80 Million/uL   Hemoglobin 13.4 13.2 - 17.1 g/dL   HCT 40.5 38.5 - 50.0 %   MCV 92.9 80.0 - 100.0 fL   MCH 30.7 27.0 - 33.0 pg   MCHC 33.1 32.0 - 36.0 g/dL   RDW 12.4 11.0 - 15.0 %   Platelets 207 140 - 400 Thousand/uL   MPV 12.2 7.5 - 12.5 fL   Neutro Abs 4,558 1,500 - 7,800 cells/uL   Lymphs Abs 1,871 850 - 3,900 cells/uL   Absolute Monocytes 616 200 - 950 cells/uL   Eosinophils Absolute 631 (H) 15 - 500 cells/uL   Basophils Absolute 23 0 - 200 cells/uL   Neutrophils Relative % 59.2 %   Total Lymphocyte 24.3 %   Monocytes Relative 8.0 %   Eosinophils Relative 8.2 %   Basophils Relative 0.3 %  Hemoglobin A1c  Result Value Ref Range   Hgb A1c MFr Bld 7.2 (H) <5.7 % of total Hgb   Mean Plasma Glucose 160 mg/dL   eAG (mmol/L) 8.9 mmol/L      Assessment & Plan:   Problem List Items Addressed  This Visit    Type 2 diabetes mellitus with diabetic nephropathy (HCC)    Stable controlled A1c at 7.2, with improving lifestyle Complications - CKD-III, hyperlipidemia, CAD, obesity - increases risk of future cardiovascular complications   Plan:  1. Continue current Metformin 500mg  BID - Future consider injectable GLP1 meds, due to CKD limit other options 2. Encourage improved lifestyle - low carb, low sugar diet, reduce portion size, continue improving and start more regular exercise 3. Check CBG, bring log to next visit for review - check insurance for covered brand glucometer call back if need 4. Continue ASA, ACEi, Statin 5. Advised to schedule DM ophtho exam, send record - [place]      Overweight (BMI 25.0-29.9)   Hyperlipidemia associated with type 2 diabetes mellitus ([place])    Well controlled cholesterol on statin  Last lipid panel 09/2020 Known CAD CABG  Plan: 1. Continue current meds - Atorvastatin 80mg  daily, Fish Oil omega 3 2. Continue ASA 81mg  for secondary  ASCVD risk reduction 3. Encourage improved lifestyle - low carb/cholesterol, reduce portion size, continue improving regular exercise      Coronary artery disease of bypass graft of native heart with stable angina pectoris (HCC) (Chronic)    Stable, without anginal symptoms Controlled on Imdur and med management Followed by [facility]      CKD (chronic kidney disease), stage III ([place])    Secondary to DM, HTN, age With CKD-III On ACEi  Creatinine improved to 1.1, previously higher. Improved PO water intake      Benign hypertension with CKD (chronic kidney disease) stage III (HCC)    Controlled HTN Complicated by CAD, CKD-III  Improved Creatinine    Plan:  1. Continue current BP regimen - Metoprolol 25mg  BID, Lisiniopril 10mg  daily, Isosorbide mononite 2. Encourage improved lifestyle - low sodium diet, regular exercise 3. Continue monitor BP outside office, bring readings to next visit, if  persistently >140/90 or new symptoms notify office sooner      Atrial fibrillation status post cardioversion ([place])    Stable, sinus rhythm s/p cardioversion 07/2020 Bradycardia, on lower dose BB Metoprolol 25 BID now On Anticoagulation still Eliquis       Other Visit Diagnoses    Annual physical exam    -  Primary      Updated Health Maintenance information Colonoscopy 05/2022 PSA mild elevated from 1.1 up to 2.2, will repeat in 6 months with labs. Reviewed recent lab results with patient Encouraged improvement to lifestyle with diet and exercise Goal of weight loss   No orders of the defined types were placed in this encounter.     Follow up plan: Return in about 6 months (around 03/19/2021) for 6 month follow-up non fasting lab then 1 week later Follow-up DM, HTN, PSA.  Future labs 03/17/21 - A1c, BMET, PSA  [name] Putnam, [name] 09/17/2020, 8:08 AM

## 2020-09-17 NOTE — Assessment & Plan Note (Signed)
Secondary to DM, HTN, age With CKD-III On ACEi  Creatinine improved to 1.1, previously higher. Improved PO water intake

## 2020-09-17 NOTE — Assessment & Plan Note (Signed)
Stable controlled A1c at 7.2, with improving lifestyle Complications - CKD-III, hyperlipidemia, CAD, obesity - increases risk of future cardiovascular complications   Plan:  1. Continue current Metformin 500mg  BID - Future consider injectable GLP1 meds, due to CKD limit other options 2. Encourage improved lifestyle - low carb, low sugar diet, reduce portion size, continue improving and start more regular exercise 3. Check CBG, bring log to next visit for review - check insurance for covered brand glucometer call back if need 4. Continue ASA, ACEi, Statin 5. Advised to schedule DM ophtho exam, send record - [place]

## 2020-09-17 NOTE — Assessment & Plan Note (Signed)
Stable, sinus rhythm s/p cardioversion 07/2020 Bradycardia, on lower dose BB Metoprolol 25 BID now On Anticoagulation still Eliquis

## 2020-10-31 ENCOUNTER — Encounter: Payer: Self-pay | Admitting: Internal Medicine

## 2020-10-31 ENCOUNTER — Ambulatory Visit: Payer: Medicare HMO | Admitting: Internal Medicine

## 2020-10-31 ENCOUNTER — Other Ambulatory Visit: Payer: Self-pay

## 2020-10-31 VITALS — BP 114/70 | HR 51 | Ht 76.0 in | Wt 229.0 lb

## 2020-10-31 DIAGNOSIS — E1169 Type 2 diabetes mellitus with other specified complication: Secondary | ICD-10-CM | POA: Diagnosis not present

## 2020-10-31 DIAGNOSIS — I4819 Other persistent atrial fibrillation: Secondary | ICD-10-CM

## 2020-10-31 DIAGNOSIS — I10 Essential (primary) hypertension: Secondary | ICD-10-CM

## 2020-10-31 DIAGNOSIS — I25118 Atherosclerotic heart disease of native coronary artery with other forms of angina pectoris: Secondary | ICD-10-CM | POA: Diagnosis not present

## 2020-10-31 DIAGNOSIS — E785 Hyperlipidemia, unspecified: Secondary | ICD-10-CM | POA: Diagnosis not present

## 2020-10-31 NOTE — Patient Instructions (Signed)
Medication Instructions:  - Your physician has recommended you make the following change in your medication:   1) STOP aspirin  *If you need a refill on your cardiac medications before your next appointment, please call your pharmacy*   Lab Work: - none ordered  If you have labs (blood work) drawn today and your tests are completely normal, you will receive your results only by: Marland Kitchen MyChart Message (if you have MyChart) OR . A paper copy in the mail If you have any lab test that is abnormal or we need to change your treatment, we will call you to review the results.   Testing/Procedures: - none ordered   Follow-Up: At [facility], you and your health needs are our priority.  As part of our continuing mission to provide you with exceptional heart care, we have created designated Provider Care Teams.  These Care Teams include your primary Cardiologist (physician) and Advanced Practice Providers (APPs -  Physician Assistants and Nurse Practitioners) who all work together to provide you with the care you need, when you need it.  We recommend signing up for the patient portal called "MyChart".  Sign up information is provided on this After Visit Summary.  MyChart is used to connect with patients for Virtual Visits (Telemedicine).  Patients are able to view lab/test results, encounter notes, upcoming appointments, etc.  Non-urgent messages can be sent to your provider as well.   To learn more about what you can do with MyChart, go to [email or web address].    Your next appointment:   6 month(s)  The format for your next appointment:   In Person  Provider:   You may see Nelva Bush, MD or one of the following Advanced Practice Providers on your designated Care Team:    Murray Hodgkins, NP  Christell Faith, PA-C  Marrianne Mood, PA-C  Cadence [place], [place]  Laurann [place], NP    Other Instructions n/a

## 2020-10-31 NOTE — Progress Notes (Signed)
Follow-up Outpatient Visit Date: 10/31/2020  Primary Care Provider: Olin Hauser, DO [address] [address]  Chief Complaint: Follow-up CAD and atrial fibrillation  HPI:  [name] [name] is a 70 y.o. male with history of coronary artery disease status post CABG (2013) persistent atrial fibrillation, hypertension, hyperlipidemia, type 2 diabetes mellitus, who presents for follow-up of coronary artery disease and atrial fibrillation.  I last saw him in January, at which time he was doing well other than issues related to recurrent inguinal hernia.  However, he was noted to be in atrial fibrillation at that time.  He was seen 2 weeks later by Cadence Furth, PA, at which time he remained in atrial fibrillation.  He underwent cardioversion in early February and remained in sinus rhythm at follow-up on 07/31/2020.  Today, [name] [name] reports that he has been feeling relatively well.  He had an episode of chest, neck, and back pain recently when he woke up.  He felt like it was due to sleeping in an awkward position.  He purchased a new mattress and has not had this problem again.  He denies exertional symptoms, including chest pain and shortness of breath.  His heart rates at home remain low (upper 40's and 50's) without lightheadedness, fatigue, or other symptoms   He denies orthopnea, PND, and edema, as well as palpitations.  --------------------------------------------------------------------------------------------------  Past Medical/Surgical History: 1. HTN 2. Type II DM 3. Hyperlipidemia 4. CAD: Unstable angina 11/13 with LHC showing severe 3 vessel disease. He had CABG [name]) with LIMA-LAD, SVG-D2, seq SVG-PLV/PDA, SVG-OM. Echo (11/13) with EF 60%, mild LAE. Abnormal stress test in 7/15 with LHC showing occluded SVG-RCA, occluded SVG-D, occluded SVG-OM, and patent LIMA-LAD. Medical management.  5.  Persistent atrial fibrillation (DCCV 07/2020)  Recent CV Pertinent  Labs: Lab Results  Component Value Date   CHOL 95 09/11/2020   CHOL 121 07/06/2017   HDL 31 (L) 09/11/2020   HDL 37 (L) 07/06/2017   LDLCALC 50 09/11/2020   TRIG 67 09/11/2020   CHOLHDL 3.1 09/11/2020   INR 1.10 12/18/2013   K 4.1 09/11/2020   MG 2.0 07/06/2017   BUN 20 09/11/2020   BUN 22 07/03/2020   CREATININE 1.16 09/11/2020    Past medical and surgical history were reviewed and updated in EPIC.  Current Meds  Medication Sig  . acetaminophen (TYLENOL) 325 MG tablet Take 650 mg by mouth every 6 (six) hours as needed for headache.  Marland Kitchen apixaban (ELIQUIS) 5 MG TABS tablet Take 1 tablet (5 mg total) by mouth 2 (two) times daily.  Marland Kitchen atorvastatin (LIPITOR) 80 MG tablet TAKE 1 TABLET BY MOUTH AT 6PM AS DIRECTED  . Coenzyme Q10 (CO Q 10) 100 MG CAPS Take 200 mg by mouth daily.  . fexofenadine (ALLEGRA) 180 MG tablet Take 180 mg by mouth daily.  . fish oil-omega-3 fatty acids 1000 MG capsule Take 1 g by mouth daily.  . isosorbide mononitrate (IMDUR) 30 MG 24 hr tablet TAKE 1 TABLET BY MOUTH EVERY DAY  . lisinopril (ZESTRIL) 10 MG tablet TAKE 1 TABLET BY MOUTH EVERY DAY  . metFORMIN (GLUCOPHAGE) 500 MG tablet Take 1 tablet (500 mg total) by mouth 2 (two) times daily with a meal.  . metoprolol tartrate (LOPRESSOR) 50 MG tablet Take 0.5 tablets (25 mg total) by mouth 2 (two) times daily.  . nitroGLYCERIN (NITROSTAT) 0.4 MG SL tablet Place 1 tablet (0.4 mg total) under the tongue every 5 (five) minutes as  needed for chest pain.  . [DISCONTINUED] aspirin EC 81 MG tablet Take 81 mg by mouth daily.    Allergies: Penicillins  Social History   Tobacco Use  . Smoking status: Never Smoker  . Smokeless tobacco: Never Used  Vaping Use  . Vaping Use: Never used  Substance Use Topics  . Alcohol use: Not Currently  . Drug use: No    Family History  Problem Relation Age of Onset  . CAD Mother   . Heart attack Mother   . CAD Father   . Diabetes Father   . Heart attack Father   . Colon  cancer Neg Hx   . Prostate cancer Neg Hx     Review of Systems: A 12-system review of systems was performed and was negative except as noted in the HPI.  --------------------------------------------------------------------------------------------------  Physical Exam: BP 114/70 (BP Location: Left Arm, Patient Position: Sitting, Cuff Size: Large)   Pulse (!) 51   Ht 6\' 4"  (1.93 m)   Wt 229 lb (103.9 kg)   SpO2 98%   BMI 27.87 kg/m   General:  NAD. Neck: No JVD or HJR. Lungs: Clear to auscultation bilaterally without wheezes or crackles. Heart: Bradycardic but regular without murmurs, rubs, or gallops. Abdomen: Soft, nontender, nondistended. Extremities: No lower extremity edema.  EKG:  Sinus bradycardia with LAFB and borderline LVH.  No significant change since prior tracing on 07/31/2020.  Lab Results  Component Value Date   WBC 7.7 09/11/2020   HGB 13.4 09/11/2020   HCT 40.5 09/11/2020   MCV 92.9 09/11/2020   PLT 207 09/11/2020    Lab Results  Component Value Date   NA 140 09/11/2020   K 4.1 09/11/2020   CL 102 09/11/2020   CO2 30 09/11/2020   BUN 20 09/11/2020   CREATININE 1.16 09/11/2020   GLUCOSE 135 (H) 09/11/2020   ALT 26 09/11/2020    Lab Results  Component Value Date   CHOL 95 09/11/2020   HDL 31 (L) 09/11/2020   LDLCALC 50 09/11/2020   TRIG 67 09/11/2020   CHOLHDL 3.1 09/11/2020    --------------------------------------------------------------------------------------------------  ASSESSMENT AND PLAN: Coronary artery disease with stable angina: No angina reported on current antianginal regimen of isosorbide mononitrate and metoprolol.  Continue current medications for secondary prevention other than aspirin, which can be stopped given long-term anticoagulation with apixaban.  Persistent atrial fibrillation: [name] [name] is maintaining sinus rhythm following cardioversion in February.  He remains bradycardic but without symptoms.  We will continue  his current medications, including indefinite anticoagulation with apixaban in the setting of a CHADSVASC score of at least 4.  Hypertension: BP well-controlled.  Continue current medications.  Hyperlipidemia associated with type 2 diabetes mellitus: Lipids well-controlled other than low HDL on last check in 08/2020.  Continue atorvastatin 80 mg daily.  Follow-up: Return to clinic in 6 months.  Nelva Bush, MD 10/31/2020 8:49 PM

## 2020-11-08 ENCOUNTER — Other Ambulatory Visit: Payer: Self-pay | Admitting: Internal Medicine

## 2020-11-09 ENCOUNTER — Other Ambulatory Visit: Payer: Self-pay | Admitting: Internal Medicine

## 2020-11-12 ENCOUNTER — Other Ambulatory Visit: Payer: Self-pay | Admitting: Internal Medicine

## 2020-11-12 DIAGNOSIS — I10 Essential (primary) hypertension: Secondary | ICD-10-CM

## 2020-11-12 DIAGNOSIS — I480 Paroxysmal atrial fibrillation: Secondary | ICD-10-CM

## 2021-01-13 ENCOUNTER — Other Ambulatory Visit: Payer: Self-pay | Admitting: Internal Medicine

## 2021-01-13 NOTE — Telephone Encounter (Signed)
Refill Request.  

## 2021-01-13 NOTE — Telephone Encounter (Signed)
Prescription refill request for Eliquis received. Indication: aflutter/afib  Last office visit: end, 10/31/2020 Scr: 1.16, 09/11/2020 Age: 70 yo  Weight: 103.9 kg  Refill sent.

## 2021-01-20 ENCOUNTER — Telehealth: Payer: Self-pay | Admitting: Family Medicine

## 2021-01-20 NOTE — Telephone Encounter (Signed)
Left message for patient to call back and schedule Medicare Annual Wellness Visit (AWV) to be done virtually or by telephone.  No hx of AWV eligible as of 10/12/17  Please schedule at anytime with [facility].      40 Minutes appointment   Any questions, please call me at [phone]

## 2021-03-16 ENCOUNTER — Other Ambulatory Visit: Payer: Self-pay

## 2021-03-16 DIAGNOSIS — E1121 Type 2 diabetes mellitus with diabetic nephropathy: Secondary | ICD-10-CM

## 2021-03-16 DIAGNOSIS — R351 Nocturia: Secondary | ICD-10-CM

## 2021-03-16 DIAGNOSIS — I129 Hypertensive chronic kidney disease with stage 1 through stage 4 chronic kidney disease, or unspecified chronic kidney disease: Secondary | ICD-10-CM

## 2021-03-17 ENCOUNTER — Other Ambulatory Visit: Payer: Medicare HMO

## 2021-03-17 DIAGNOSIS — E1121 Type 2 diabetes mellitus with diabetic nephropathy: Secondary | ICD-10-CM | POA: Diagnosis not present

## 2021-03-17 DIAGNOSIS — I129 Hypertensive chronic kidney disease with stage 1 through stage 4 chronic kidney disease, or unspecified chronic kidney disease: Secondary | ICD-10-CM | POA: Diagnosis not present

## 2021-03-17 DIAGNOSIS — N183 Chronic kidney disease, stage 3 unspecified: Secondary | ICD-10-CM | POA: Diagnosis not present

## 2021-03-17 DIAGNOSIS — R351 Nocturia: Secondary | ICD-10-CM | POA: Diagnosis not present

## 2021-03-18 LAB — BASIC METABOLIC PANEL WITH GFR
BUN: 15 mg/dL (ref 7–25)
CO2: 29 mmol/L (ref 20–32)
Calcium: 10 mg/dL (ref 8.6–10.3)
Chloride: 103 mmol/L (ref 98–110)
Creat: 1.15 mg/dL (ref 0.70–1.35)
Glucose, Bld: 156 mg/dL — ABNORMAL HIGH (ref 65–99)
Potassium: 4.7 mmol/L (ref 3.5–5.3)
Sodium: 140 mmol/L (ref 135–146)
eGFR: 69 mL/min/{1.73_m2} (ref >=60)

## 2021-03-18 LAB — HEMOGLOBIN A1C
Hgb A1c MFr Bld: 7.3 % of total Hgb — ABNORMAL HIGH (ref <5.7)
Mean Plasma Glucose: 163 mg/dL
eAG (mmol/L): 9 mmol/L

## 2021-03-18 LAB — PSA: PSA: 1.73 ng/mL (ref <=4.00)

## 2021-03-23 ENCOUNTER — Other Ambulatory Visit: Payer: Self-pay

## 2021-03-23 ENCOUNTER — Ambulatory Visit (INDEPENDENT_AMBULATORY_CARE_PROVIDER_SITE_OTHER): Payer: Medicare HMO | Admitting: Family Medicine

## 2021-03-23 ENCOUNTER — Other Ambulatory Visit: Payer: Self-pay | Admitting: Family Medicine

## 2021-03-23 ENCOUNTER — Encounter: Payer: Self-pay | Admitting: Family Medicine

## 2021-03-23 VITALS — BP 137/77 | HR 65 | Ht 76.0 in | Wt 233.6 lb

## 2021-03-23 DIAGNOSIS — E1169 Type 2 diabetes mellitus with other specified complication: Secondary | ICD-10-CM

## 2021-03-23 DIAGNOSIS — Z23 Encounter for immunization: Secondary | ICD-10-CM

## 2021-03-23 DIAGNOSIS — E785 Hyperlipidemia, unspecified: Secondary | ICD-10-CM | POA: Diagnosis not present

## 2021-03-23 DIAGNOSIS — I25708 Atherosclerosis of coronary artery bypass graft(s), unspecified, with other forms of angina pectoris: Secondary | ICD-10-CM

## 2021-03-23 DIAGNOSIS — Z0000 Encounter for general adult medical examination without abnormal findings: Secondary | ICD-10-CM

## 2021-03-23 DIAGNOSIS — E1121 Type 2 diabetes mellitus with diabetic nephropathy: Secondary | ICD-10-CM

## 2021-03-23 DIAGNOSIS — N182 Chronic kidney disease, stage 2 (mild): Secondary | ICD-10-CM | POA: Diagnosis not present

## 2021-03-23 DIAGNOSIS — I129 Hypertensive chronic kidney disease with stage 1 through stage 4 chronic kidney disease, or unspecified chronic kidney disease: Secondary | ICD-10-CM

## 2021-03-23 DIAGNOSIS — R351 Nocturia: Secondary | ICD-10-CM

## 2021-03-23 MED ORDER — METFORMIN HCL 500 MG PO TABS: 500.0000 mg | ORAL_TABLET | Freq: Two times a day (BID) | ORAL | 3 refills | Status: DC

## 2021-03-23 NOTE — Assessment & Plan Note (Signed)
Controlled HTN Complicated by CAD, CKD-II  Improved Creatinine on last lab GFR >69    Plan:  1. Continue current BP regimen - Metoprolol 25mg  BID, Lisiniopril 10mg  daily, Isosorbide mononite 2. Encourage improved lifestyle - low sodium diet, regular exercise 3. Continue monitor BP outside office, bring readings to next visit, if persistently >140/90 or new symptoms notify office sooner

## 2021-03-23 NOTE — Progress Notes (Signed)
Subjective:    Patient ID: [name] [name], male    DOB: [birth year], 70 y.o.   MRN: [id number]  [name] [name] is a 70 y.o. male presenting on 03/23/2021 for Diabetes and Hypertension   HPI  Type 2 Diabetes, with Nephropathy CKD III Overweight BMI >29 See prior notes for background information. - Today doing well Last lab shows Creatinine 1.16, improved from prior 1.2 to 1.4 range. CBGs: No CBGs Meds: Metformin 588m BID - tolerating well without side effects Currently on ACEi Lifestyle: - Diet (improving diabetic diet, add green tea, adding more water to daily diet) - Exercise (improved exercise outdoors, yard work [name] [name] is tolerating exertion even better, stationary bike) - Father with diabetes Needs to schedule DM Eye Exam. Denies hypoglycemia, visual changes, numbness or tingling, polyuria    CHRONIC HTN: Reports no new concerns Follows with Cardiology [name] [name] still has chronic bradycardia. Current Meds - Lisinopril 190mdaily, Imdur 308maily, Metoprolol 21m41mD  (half of 50mg29mports good compliance, took meds today. Tolerating well, w/o complaints. Denies CP, dyspnea, HA, edema, dizziness / lightheadedness   CAD s/p CABG 2013 LHC with severe 3 vessel disease / PAF [facility] Recent updates in 07/2020 [name] [name] had cardioversion for paroxysmal atrial fibrillation, very successful procedure, [name] [name] has maintained sinus rhythm since Continues on anticoagulation - Taking Eliquis 5mg B67m high intensity statin, ASA, omega 3 fish oil, imdur, ACEi, BB - has NTG SL. Not using it.    HYPERLIPIDEMIA: - Reports no concerns. Last lipid panel 09/2020, controlled, with LDL 50 - Currently taking Atorvastatin 80mg d76m, tolerating well without side effects or myalgias   Health Maintenance: [facility] - Dr BucciniCristina Gongnoscopy 05/2019 - tubular adenoma x 3 - benign, next repeat in 3 years 05/2022   UTD COVID Moderna   PSA screening last result 1.73 down from prior 2.26  (09/2020), previous range 0.9 to 1.4  Due for Flu Shot, will receive today    Depression screen PHQ 2/9[place]/03/2021 03/19/2020 04/18/2019  Decreased Interest 0 0 0  Down, Depressed, Hopeless 0 0 0  PHQ - 2 Score 0 0 0    Social History   Tobacco Use   Smoking status: Never   Smokeless tobacco: Never  Vaping Use   Vaping Use: Never used  Substance Use Topics   Alcohol use: Not Currently   Drug use: No    Review of Systems Per HPI unless specifically indicated above     Objective:    BP 137/77   Pulse 65   Ht _0  (1.93 m)   Wt 233 lb 9.6 oz (106 kg)   SpO2 100%   BMI 28.43 kg/m   Wt Readings from Last 3 Encounters:  03/23/21 233 lb 9.6 oz (106 kg)  10/31/20 229 lb (103.9 kg)  09/17/20 233 lb 6.4 oz (105.9 kg)    Physical Exam Vitals and nursing note reviewed.  Constitutional:      General: [name] [name] is not in acute distress.    Appearance: [name] [name] is well-developed. [name] [name] is not diaphoretic.     Comments: Well-appearing, comfortable, cooperative  HENT:     Head: Normocephalic and atraumatic.  Eyes:     General:        Right eye: No discharge.        Left eye: No discharge.     Conjunctiva/sclera: Conjunctivae normal.  Neck:     Thyroid: No thyromegaly.  Cardiovascular:     Rate and Rhythm:  Normal rate and regular rhythm.     Pulses: Normal pulses.     Heart sounds: Normal heart sounds. No murmur heard. Pulmonary:     Effort: Pulmonary effort is normal. No respiratory distress.     Breath sounds: Normal breath sounds. No wheezing or rales.  Musculoskeletal:        General: Normal range of motion.     Cervical back: Normal range of motion and neck supple.  Lymphadenopathy:     Cervical: No cervical adenopathy.  Skin:    General: Skin is warm and dry.     Findings: No erythema or rash.  Neurological:     Mental Status: [name] [name] is alert and oriented to person, place, and time. Mental status is at baseline.  Psychiatric:        Behavior: Behavior normal.     Comments:  Well groomed, good eye contact, normal speech and thoughts    Diabetic Foot Exam - Simple   Simple Foot Form Diabetic Foot exam was performed with the following findings: Yes 03/23/2021  8:45 AM  Visual Inspection See comments: Yes Sensation Testing Intact to touch and monofilament testing bilaterally: Yes Pulse Check Posterior Tibialis and Dorsalis pulse intact bilaterally: Yes Comments Bilateral dry skin callus without ulceration formation forefoot and heel. Intact monofilament testing.      Results for orders placed or performed in visit on 96[birth year]  BASIC METABOLIC PANEL WITH GFR  Result Value Ref Range   Glucose, Bld 156 (H) 65 - 99 mg/dL   BUN 15 7 - 25 mg/dL   Creat 1.15 0.70 - 1.35 mg/dL   eGFR 69 > OR = 60 mL/min/1.22m   BUN/Creatinine Ratio NOT APPLICABLE 6 - 22 (calc)   Sodium 140 135 - 146 mmol/L   Potassium 4.7 3.5 - 5.3 mmol/L   Chloride 103 98 - 110 mmol/L   CO2 29 20 - 32 mmol/L   Calcium 10.0 8.6 - 10.3 mg/dL  PSA  Result Value Ref Range   PSA 1.73 < OR = 4.00 ng/mL  Hemoglobin A1c  Result Value Ref Range   Hgb A1c MFr Bld 7.3 (H) <5.7 % of total Hgb   Mean Plasma Glucose 163 mg/dL   eAG (mmol/L) 9.0 mmol/L      Assessment & Plan:   Problem List Items Addressed This Visit     Coronary artery disease of bypass graft of native heart with stable angina pectoris (HCC) (Chronic)   Type 2 diabetes mellitus with diabetic nephropathy (HCC) - Primary    Stable controlled A1c at 7.3 with improving lifestyle Complications - CKD-II, hyperlipidemia, CAD, obesity - increases risk of future cardiovascular complications   Plan:  1. Continue current Metformin 5087mBID - Future consider injectable GLP1 meds, due to CKD limit other options 2. Encourage improved lifestyle - low carb, low sugar diet, reduce portion size, continue improving and start more regular exercise 3. Check CBG, bring log to next visit for review 4. Continue ASA, ACEi, Statin 5. Advised to  schedule DM ophtho exam, send record - [place]      Relevant Medications   metFORMIN (GLUCOPHAGE) 500 MG tablet   Hyperlipidemia associated with type 2 diabetes mellitus ([place]   Well controlled cholesterol on statin  Last lipid panel 09/2020 Known CAD CABG  Plan: 1. Continue current meds - Atorvastatin 8035maily, Fish Oil omega 3 2. Continue ASA 40m46mr secondary ASCVD risk reduction 3. Encourage improved lifestyle - low carb/cholesterol, reduce portion size, continue improving  regular exercise      Relevant Medications   metFORMIN (GLUCOPHAGE) 500 MG tablet   CKD (chronic kidney disease), stage II   Benign hypertension with CKD (chronic kidney disease), stage II    Controlled HTN Complicated by CAD, CKD-II  Improved Creatinine on last lab GFR >69    Plan:  1. Continue current BP regimen - Metoprolol 13m BID, Lisiniopril 148mdaily, Isosorbide mononite 2. Encourage improved lifestyle - low sodium diet, regular exercise 3. Continue monitor BP outside office, bring readings to next visit, if persistently >140/90 or new symptoms notify office sooner      Other Visit Diagnoses     Needs flu shot       Relevant Orders   Flu Vaccine QUAD High Dose(Fluad) (Completed)       Meds ordered this encounter  Medications   metFORMIN (GLUCOPHAGE) 500 MG tablet    Sig: Take 1 tablet (500 mg total) by mouth 2 (two) times daily with a meal.    Dispense:  180 tablet    Refill:  3    Add refills     Follow up plan: Return in about 6 months (around 09/21/2021) for 6 month fasting lab then 1 week later Annual Physical.  Future labs ordered for 09/15/21 labs ordered CBC PSA CMET Lipid A1c   AlNobie PutnamDO [facility] 03/23/2021, 8:37 AM

## 2021-03-23 NOTE — Assessment & Plan Note (Addendum)
Stable controlled A1c at 7.3 with improving lifestyle Complications - CKD-II, hyperlipidemia, CAD, obesity - increases risk of future cardiovascular complications   Plan:  1. Continue current Metformin 500mg  BID - Future consider injectable GLP1 meds, due to CKD limit other options 2. Encourage improved lifestyle - low carb, low sugar diet, reduce portion size, continue improving and start more regular exercise 3. Check CBG, bring log to next visit for review 4. Continue ASA, ACEi, Statin 5. Advised to schedule DM ophtho exam, send record - [place]

## 2021-03-23 NOTE — Patient Instructions (Addendum)
Thank you for coming to the office today.  DUE for FASTING BLOOD WORK (no food or drink after midnight before the lab appointment, only water or coffee without cream/sugar on the morning of)  SCHEDULE "Lab Only" visit in the morning at the clinic for lab draw in 6 MONTHS   - Make sure Lab Only appointment is at about 1 week before your next appointment, so that results will be available  For Lab Results, once available within 2-3 days of blood draw, you can can log in to MyChart online to view your results and a brief explanation. Also, we can discuss results at next follow-up visit.   Please schedule a Follow-up Appointment to: Return in about 6 months (around 09/21/2021) for 6 month fasting lab then 1 week later Annual Physical.  If you have any other questions or concerns, please feel free to call the office or send a message through [place]. You may also schedule an earlier appointment if necessary.  Additionally, you may be receiving a survey about your experience at our office within a few days to 1 week by e-mail or mail. We value your feedback.  Nobie Putnam, DO [place]

## 2021-03-23 NOTE — Assessment & Plan Note (Signed)
Well controlled cholesterol on statin  Last lipid panel 09/2020 Known CAD CABG  Plan: 1. Continue current meds - Atorvastatin 80mg daily, Fish Oil omega 3 2. Continue ASA 81mg for secondary ASCVD risk reduction 3. Encourage improved lifestyle - low carb/cholesterol, reduce portion size, continue improving regular exercise 

## 2021-05-01 ENCOUNTER — Ambulatory Visit: Payer: Medicare HMO | Admitting: Internal Medicine

## 2021-05-01 ENCOUNTER — Encounter: Payer: Self-pay | Admitting: Internal Medicine

## 2021-05-01 ENCOUNTER — Other Ambulatory Visit: Payer: Self-pay

## 2021-05-01 VITALS — BP 144/72 | HR 54 | Ht 76.0 in | Wt 243.0 lb

## 2021-05-01 DIAGNOSIS — I25118 Atherosclerotic heart disease of native coronary artery with other forms of angina pectoris: Secondary | ICD-10-CM

## 2021-05-01 DIAGNOSIS — E785 Hyperlipidemia, unspecified: Secondary | ICD-10-CM

## 2021-05-01 DIAGNOSIS — I10 Essential (primary) hypertension: Secondary | ICD-10-CM | POA: Diagnosis not present

## 2021-05-01 DIAGNOSIS — I4819 Other persistent atrial fibrillation: Secondary | ICD-10-CM

## 2021-05-01 DIAGNOSIS — E1169 Type 2 diabetes mellitus with other specified complication: Secondary | ICD-10-CM | POA: Diagnosis not present

## 2021-05-01 NOTE — Patient Instructions (Signed)
Medication Instructions:   Your physician recommends that you continue on your current medications as directed. Please refer to the Current Medication list given to you today.  *If you need a refill on your cardiac medications before your next appointment, please call your pharmacy*   Lab Work:  None ordered  Testing/Procedures:  None ordered   Follow-Up: At [facility], you and your health needs are our priority.  As part of our continuing mission to provide you with exceptional heart care, we have created designated Provider Care Teams.  These Care Teams include your primary Cardiologist (physician) and Advanced Practice Providers (APPs -  Physician Assistants and Nurse Practitioners) who all work together to provide you with the care you need, when you need it.  We recommend signing up for the patient portal called "MyChart".  Sign up information is provided on this After Visit Summary.  MyChart is used to connect with patients for Virtual Visits (Telemedicine).  Patients are able to view lab/test results, encounter notes, upcoming appointments, etc.  Non-urgent messages can be sent to your provider as well.   To learn more about what you can do with MyChart, go to [email or web address].    Your next appointment:   3 month(s) w/ APP for blood pressure check  The format for your next appointment:   In Person  Provider:   You may see one of the following Advanced Practice Providers on your designated Care Team:   Murray Hodgkins, NP Christell Faith, PA-C Cadence Kathlen Mody, [place]  Other Instructions  Please monitor your blood pressure at home periodically. Check BP approximately 1-2 hours after your morning medication. Contact our office if your BP is consistently greater than 140/90.

## 2021-05-01 NOTE — Progress Notes (Signed)
Follow-up Outpatient Visit Date: 05/01/2021  Primary Care Provider: Olin Hauser, DO [address] [address]  Chief Complaint: Follow-up coronary artery disease and atrial fibrillation  HPI:  Mr. Christoffel is a 70 y.o. male with history of coronary artery disease status post CABG (2013) persistent atrial fibrillation, hypertension, hyperlipidemia, type 2 diabetes mellitus, who presents for follow-up of coronary artery disease and atrial fibrillation.  I last saw him in May, at which time Mr. Gains reported an episode of neck, upper back, and chest pain upon awakening in the morning.  He felt it was due to sleeping in an awkward position.  It resolved after he purchased a new mattress.  We agreed to discontinue aspirin in the setting of long-term anticoagulation with apixaban.  No other medication changes or further testing were pursued.  Today, Mr. Goede reports that he has been feeling fairly well.  He tripped over play equipment while walking through his basement this summer and had a significant bruise on his right hip.  He has just recently finished recovering from this, though he did not fracture anything.  From a heart standpoint he has been doing well without chest pain, shortness of breath, palpitations, lightheadedness, or edema.  He remains on apixaban without bleeding.  He does not check his blood pressure regularly at home.  --------------------------------------------------------------------------------------------------  Past Medical/Surgical History: 1. HTN 2. Type II DM 3. Hyperlipidemia 4. CAD: Unstable angina 11/13 with LHC showing severe 3 vessel disease.  He had CABG [name]) with LIMA-LAD, SVG-D2, seq SVG-PLV/PDA, SVG-OM.  Echo (11/13) with EF 60%, mild LAE.  Abnormal stress test in 7/15 with LHC showing occluded SVG-RCA, occluded SVG-D, occluded SVG-OM, and patent LIMA-LAD. Medical management.  5.  Persistent atrial fibrillation (DCCV  07/2020)  Recent CV Pertinent Labs: Lab Results  Component Value Date   CHOL 95 09/11/2020   CHOL 121 07/06/2017   HDL 31 (L) 09/11/2020   HDL 37 (L) 07/06/2017   LDLCALC 50 09/11/2020   TRIG 67 09/11/2020   CHOLHDL 3.1 09/11/2020   INR 1.10 12/18/2013   K 4.7 03/17/2021   MG 2.0 07/06/2017   BUN 15 03/17/2021   BUN 22 07/03/2020   CREATININE 1.15 03/17/2021    Past medical and surgical history were reviewed and updated in EPIC.  Current Meds  Medication Sig   acetaminophen (TYLENOL) 325 MG tablet Take 650 mg by mouth every 6 (six) hours as needed for headache.   atorvastatin (LIPITOR) 80 MG tablet TAKE 1 TABLET BY MOUTH AT 6PM AS DIRECTED   Coenzyme Q10 (CO Q 10) 100 MG CAPS Take 200 mg by mouth daily.   ELIQUIS 5 MG TABS tablet TAKE 1 TABLET BY MOUTH TWICE A DAY   fexofenadine (ALLEGRA) 180 MG tablet Take 180 mg by mouth daily.   fish oil-omega-3 fatty acids 1000 MG capsule Take 1 g by mouth daily.   isosorbide mononitrate (IMDUR) 30 MG 24 hr tablet TAKE 1 TABLET BY MOUTH EVERY DAY   lisinopril (ZESTRIL) 10 MG tablet TAKE 1 TABLET BY MOUTH EVERY DAY   metFORMIN (GLUCOPHAGE) 500 MG tablet Take 1 tablet (500 mg total) by mouth 2 (two) times daily with a meal.   metoprolol tartrate (LOPRESSOR) 50 MG tablet TAKE 1/2 TABLETS BY MOUTH 2 TIMES DAILY.   nitroGLYCERIN (NITROSTAT) 0.4 MG SL tablet Place 1 tablet (0.4 mg total) under the tongue every 5 (five) minutes as needed for chest pain.    Allergies: Penicillins  Social  History   Tobacco Use   Smoking status: Never   Smokeless tobacco: Never  Vaping Use   Vaping Use: Never used  Substance Use Topics   Alcohol use: Not Currently   Drug use: No    Family History  Problem Relation Age of Onset   CAD Mother    Heart attack Mother    CAD Father    Diabetes Father    Heart attack Father    Colon cancer Neg Hx    Prostate cancer Neg Hx     Review of Systems: A 12-system review of systems was performed and was  negative except as noted in the HPI.  --------------------------------------------------------------------------------------------------  Physical Exam: BP (!) 144/72 (BP Location: Left Arm, Patient Position: Sitting)   Pulse (!) 54   Ht 6\' 4"  (1.93 m)   Wt 243 lb (110.2 kg)   SpO2 97%   BMI 29.58 kg/m  Repeat BP: 144/72  General:  NAD. Neck: No JVD or HJR. Lungs: Clear to auscultation bilaterally without wheezes or crackles. Heart: Bradycardic but regular without murmurs, rubs, or gallops. Abdomen: Soft, nontender, nondistended. Extremities: No lower extremity edema.  EKG: Sinus bradycardia with LAFB and LVH.  No significant change since 10/31/2020.  Lab Results  Component Value Date   WBC 7.7 09/11/2020   HGB 13.4 09/11/2020   HCT 40.5 09/11/2020   MCV 92.9 09/11/2020   PLT 207 09/11/2020    Lab Results  Component Value Date   NA 140 03/17/2021   K 4.7 03/17/2021   CL 103 03/17/2021   CO2 29 03/17/2021   BUN 15 03/17/2021   CREATININE 1.15 03/17/2021   GLUCOSE 156 (H) 03/17/2021   ALT 26 09/11/2020    Lab Results  Component Value Date   CHOL 95 09/11/2020   HDL 31 (L) 09/11/2020   LDLCALC 50 09/11/2020   TRIG 67 09/11/2020   CHOLHDL 3.1 09/11/2020    --------------------------------------------------------------------------------------------------  ASSESSMENT AND PLAN: Coronary artery disease with stable angina: No angina to suggest worsening coronary insufficiency.  Continue current medications for secondary prevention, including apixaban and lieu of aspirin in the setting of long-term anticoagulation.  Continue antianginal regimen consisting of metoprolol and isosorbide mononitrate.  Persistent atrial fibrillation: Mr. Haub is maintaining sinus rhythm following cardioversion earlier this summer.  Though he has modest sinus bradycardia today, he is asymptomatic.  We will continue current dose of metoprolol.  He should also remain on long-term  anticoagulation with apixaban.  Hypertension: Blood pressure mildly elevated today.  We discussed escalation of his antihypertensive regimen but have agreed to defer this.  I encouraged Mr. Craver to limit his sodium intake and to monitor his blood pressure at home.  If is consistently above 130/80, he should alert [place] so that we can consider escalation of lisinopril.  We will have him return in 3 months to reevaluate his blood pressure.  Hyperlipidemia associated with type 2 diabetes mellitus: LDL well controlled on last check in March.  Continue atorvastatin 80 mg daily.  Ongoing management of diabetes mellitus per Dr. Parks Ranger.  Follow-up: Return to clinic in 3 months for blood pressure check.  Nelva Bush, MD 05/01/2021 8:36 AM

## 2021-05-14 ENCOUNTER — Other Ambulatory Visit: Payer: Self-pay | Admitting: Internal Medicine

## 2021-05-14 DIAGNOSIS — I480 Paroxysmal atrial fibrillation: Secondary | ICD-10-CM

## 2021-05-14 DIAGNOSIS — I10 Essential (primary) hypertension: Secondary | ICD-10-CM

## 2021-07-12 ENCOUNTER — Other Ambulatory Visit: Payer: Self-pay | Admitting: Internal Medicine

## 2021-07-13 NOTE — Telephone Encounter (Signed)
Refill request

## 2021-07-13 NOTE — Telephone Encounter (Signed)
Eliquis 5 mg refill request received. Patient is 71 years old, weight- 110.2 kg, Crea- 1.15 on 03/17/21, Diagnosis- afib, and last seen by Dr. Saunders Revel on 05/01/21. Dose is appropriate based on dosing criteria. Will send in refill to requested pharmacy.

## 2021-08-04 ENCOUNTER — Other Ambulatory Visit
Admission: RE | Admit: 2021-08-04 | Discharge: 2021-08-04 | Disposition: A | Discharge status: Home / Self Care | Payer: Medicare HMO | Attending: Nurse Practitioner | Admitting: Nurse Practitioner

## 2021-08-04 ENCOUNTER — Other Ambulatory Visit: Payer: Self-pay

## 2021-08-04 ENCOUNTER — Ambulatory Visit (INDEPENDENT_AMBULATORY_CARE_PROVIDER_SITE_OTHER): Payer: Medicare HMO

## 2021-08-04 ENCOUNTER — Ambulatory Visit: Payer: Medicare HMO | Admitting: Nurse Practitioner

## 2021-08-04 ENCOUNTER — Encounter: Payer: Self-pay | Admitting: Nurse Practitioner

## 2021-08-04 VITALS — BP 128/78 | HR 94 | Ht 76.0 in | Wt 239.0 lb

## 2021-08-04 DIAGNOSIS — I2510 Atherosclerotic heart disease of native coronary artery without angina pectoris: Secondary | ICD-10-CM

## 2021-08-04 DIAGNOSIS — I10 Essential (primary) hypertension: Secondary | ICD-10-CM

## 2021-08-04 DIAGNOSIS — M13851 Other specified arthritis, right hip: Secondary | ICD-10-CM | POA: Insufficient documentation

## 2021-08-04 DIAGNOSIS — M13852 Other specified arthritis, left hip: Secondary | ICD-10-CM | POA: Insufficient documentation

## 2021-08-04 DIAGNOSIS — E785 Hyperlipidemia, unspecified: Secondary | ICD-10-CM

## 2021-08-04 DIAGNOSIS — I480 Paroxysmal atrial fibrillation: Secondary | ICD-10-CM | POA: Insufficient documentation

## 2021-08-04 DIAGNOSIS — E119 Type 2 diabetes mellitus without complications: Secondary | ICD-10-CM

## 2021-08-04 DIAGNOSIS — R0683 Snoring: Secondary | ICD-10-CM | POA: Insufficient documentation

## 2021-08-04 DIAGNOSIS — Z951 Presence of aortocoronary bypass graft: Secondary | ICD-10-CM | POA: Insufficient documentation

## 2021-08-04 DIAGNOSIS — G4730 Sleep apnea, unspecified: Secondary | ICD-10-CM | POA: Diagnosis not present

## 2021-08-04 LAB — TSH: TSH: 3.555 u[IU]/mL (ref 0.350–4.500)

## 2021-08-04 LAB — BASIC METABOLIC PANEL
Anion gap: 11 (ref 5–15)
BUN: 25 mg/dL — ABNORMAL HIGH (ref 8–23)
CO2: 23 mmol/L (ref 22–32)
Calcium: 9.7 mg/dL (ref 8.9–10.3)
Chloride: 101 mmol/L (ref 98–111)
Creatinine, Ser: 1.27 mg/dL — ABNORMAL HIGH (ref 0.61–1.24)
GFR, Estimated: >60 mL/min (ref >60)
Glucose, Bld: 216 mg/dL — ABNORMAL HIGH (ref 70–99)
Potassium: 4.6 mmol/L (ref 3.5–5.1)
Sodium: 135 mmol/L (ref 135–145)

## 2021-08-04 LAB — MAGNESIUM: Magnesium: 1.7 mg/dL (ref 1.7–2.4)

## 2021-08-04 NOTE — Progress Notes (Signed)
Office Visit    Patient Name: [name] [name] Date of Encounter: 08/04/2021  Primary Care Provider:  Olin Hauser, DO Primary Cardiologist:  Nelva Bush, MD  Chief Complaint    71 year old male with a history of CAD status post four-vessel bypass in 8756, diastolic dysfunction, hypertension, hyperlipidemia, type 2 diabetes mellitus, and paroxysmal atrial fibrillation, who presents for follow-up of CAD and A-fib.  Past Medical History    Past Medical History:  Diagnosis Date   Allergy    Coronary artery disease 2013   a. [place] 2013 w/ severe 3-V CAD; b. s/p 4-V CABG in 2013 (LIMA-LAD, VG-D2, seq VG-PLA/PDA, VG-OM; c. LHC 2015 patent LIMA-LAD, occluded VGs, med Rx   Diastolic dysfunction    a. 07/2017 Echo: eF 50-55%, mild LVH. no rwma, Gr2 DD. Mildly dil LA/RA. Nl RV size/fxn.   Essential hypertension    Headache(784.0)    Hyperlipidemia LDL goal <70    PAF (paroxysmal atrial fibrillation) ([place])    a. noted post-operatively following CABG in 2013; b. recurrence 06/2017; c. CHADS2VASc => 4 (HTN, age x 1, DM, vascular disease)--> Eliquis; d. 07/2020 recurrent AFib-> DCCV (200J).   Type II diabetes mellitus ([place])    Past Surgical History:  Procedure Laterality Date   CARDIAC CATHETERIZATION  12/18/13   Occluded SVGs   CARDIOVERSION N/A 07/22/2020   Procedure: CARDIOVERSION;  Surgeon: Nelva Bush, MD;  Location: [facility];  Service: Cardiovascular;  Laterality: N/A;   CORONARY ARTERY BYPASS GRAFT  04/28/2012   Procedure: CORONARY ARTERY BYPASS GRAFTING (CABG);  Surgeon: Ivin Poot, MD;  Location: [place];  Service: Open Heart Surgery;  Laterality: N/A;  Right saphenous vein used for bypass grafting. Five  bypasses performed including left mammary artery.    INGUINAL HERNIA REPAIR     right    LEFT HEART CATHETERIZATION WITH CORONARY ANGIOGRAM N/A 04/27/2012   Procedure: LEFT HEART CATHETERIZATION WITH CORONARY ANGIOGRAM;  Surgeon: Larey Dresser, MD;  Location[facility]  [facility] C[facility] LAB;  Service: Cardiovascular;  Laterality: N/A;   LEFT HEART CATHETERIZATION WITH CORONARY/GRAFT ANGIOGRAM N/A 12/18/2013   Procedure: LEFT HEART CATHETERIZATION WITH Beatrix Fetters;  Surgeon: Sinclair Grooms, MD;  Location: [facility] CATH LAB;  Service: Cardiovascular;  Laterality: N/A;    Allergies  Allergies  Allergen Reactions   Penicillins Hives, Itching and Rash    History of Present Illness    71 year old male with the above past medical history including CAD status post four-vessel bypass in 4332, diastolic dysfunction, hypertension, hyperlipidemia, type 2 diabetes mellitus, and paroxysmal atrial fibrillation.  His last diagnostic catheterization was performed in 2015, revealing a patent LIMA to LAD with occluded vein grafts.  He has been medically managed since.  In January 2022, he was noted to be in asymptomatic atrial fibrillation.  He was placed on Eliquis and subsequently underwent successful cardioversion in early February 2022.  [name] [name] was last seen in cardiology clinic in November 2022, at which time he was feeling well.  Since then, he notes that he continues to feel well from a cardiac standpoint, though his activity has been hindered by bilateral hip pain, for which he takes as needed ibuprofen, usually a few times a week.  He notes that since moving to [place] and retiring, he is less active though he still does some yard work without symptoms or limitations.  He denies chest pain, dyspnea, palpitations, PND, orthopnea, dizziness, syncope, edema, or early satiety.  He has started checking his blood  pressure at home and presented numbers for review today.  Systolics are trending in the 1 teens to 58s since early January.  His heart rate is elevated today at 94 bpm and he has been found to be back in atrial fibrillation.  Home Medications    Current Outpatient Medications  Medication Sig Dispense Refill   acetaminophen (TYLENOL) 325 MG tablet Take 650  mg by mouth every 6 (six) hours as needed for headache.     atorvastatin (LIPITOR) 80 MG tablet TAKE 1 TABLET BY MOUTH AT 6PM AS DIRECTED 90 tablet 3   Coenzyme Q10 (CO Q 10) 100 MG CAPS Take 200 mg by mouth daily. 30 capsule 6   ELIQUIS 5 MG TABS tablet TAKE 1 TABLET BY MOUTH TWICE A DAY 60 tablet 5   fexofenadine (ALLEGRA) 180 MG tablet Take 180 mg by mouth daily.     fish oil-omega-3 fatty acids 1000 MG capsule Take 1 g by mouth daily.     lisinopril (ZESTRIL) 10 MG tablet TAKE 1 TABLET BY MOUTH EVERY DAY 90 tablet 2   metFORMIN (GLUCOPHAGE) 500 MG tablet Take 1 tablet (500 mg total) by mouth 2 (two) times daily with a meal. 180 tablet 3   metoprolol tartrate (LOPRESSOR) 50 MG tablet TAKE 1/2 TABLETS BY MOUTH 2 TIMES DAILY. 90 tablet 0   nitroGLYCERIN (NITROSTAT) 0.4 MG SL tablet Place 1 tablet (0.4 mg total) under the tongue every 5 (five) minutes as needed for chest pain. 25 tablet 3   isosorbide mononitrate (IMDUR) 30 MG 24 hr tablet TAKE 1 TABLET BY MOUTH EVERY DAY 90 tablet 0   No current facility-administered medications for this visit.     Review of Systems    Arthritic pain in bilateral hips.  He denies chest pain, palpitations, dyspnea, pnd, orthopnea, n, v, dizziness, syncope, edema, weight gain, or early satiety.  All other systems reviewed and are otherwise negative except as noted above.    Physical Exam    VS:  BP 128/78    Pulse 94    Ht 6\' 4"  (1.93 m)    Wt 239 lb (108.4 kg)    SpO2 98%    BMI 29.09 kg/m  , BMI Body mass index is 29.09 kg/m. Vitals:   08/04/21 0756 08/04/21 0845  BP: 136/80 128/78  Pulse: 94   SpO2: 98%     STOP-Bang Score:  5      GEN: Well nourished, well developed, in no acute distress. HEENT: normal. Neck: Supple, no JVD, carotid bruits, or masses. Cardiac: IR, IR, no murmurs, rubs, or gallops. No clubbing, cyanosis, edema.  Radials/PT 2+ and equal bilaterally.  Respiratory:  Respirations regular and unlabored, clear to auscultation  bilaterally. GI: Soft, nontender, nondistended, BS + x 4. MS: no deformity or atrophy. Skin: warm and dry, no rash. Neuro:  Strength and sensation are intact. Psych: Normal affect.  Accessory Clinical Findings    ECG personally reviewed by me today - afib, 94, LAD, LAFB, inc RBBB - no acute changes.  Lab Results  Component Value Date   WBC 7.7 09/11/2020   HGB 13.4 09/11/2020   HCT 40.5 09/11/2020   MCV 92.9 09/11/2020   PLT 207 09/11/2020   Lab Results  Component Value Date   CREATININE 1.15 03/17/2021   BUN 15 03/17/2021   NA 140 03/17/2021   K 4.7 03/17/2021   CL 103 03/17/2021   CO2 29 03/17/2021   Lab Results  Component Value Date  ALT 26 09/11/2020   AST 25 09/11/2020   ALKPHOS 98 07/03/2020   BILITOT 0.7 09/11/2020   Lab Results  Component Value Date   CHOL 95 09/11/2020   HDL 31 (L) 09/11/2020   LDLCALC 50 09/11/2020   TRIG 67 09/11/2020   CHOLHDL 3.1 09/11/2020    Lab Results  Component Value Date   HGBA1C 7.3 (H) 03/17/2021    Assessment & Plan    1.  Paroxysmal atrial fibrillation: This was initially diagnosed in January 2022 and he underwent successful cardioversion in February 2022.  He has been anticoagulated with Eliquis in the setting of a CHA2DS2-VASc of 4, and treated with low-dose metoprolol therapy in the setting of baseline sinus bradycardia.  He has been doing well over the past few months without symptoms or limitations however, I noted him to be irregular on examination and follow-up ECG confirms recurrent atrial fibrillation.  He is completely asymptomatic.  We discussed options for management, and given recurrent A-fib after just 1 year, we agreed to have him evaluated by Dr. Quentin Ore to discuss antiarrhythmic versus ablative options.  I am going to continue his current dose of metoprolol given history of bradycardia when in sinus rhythm.  I will follow-up a basic metabolic panel, TSH, and magnesium today.  F/u echo.  Given oral  anticoagulation with Eliquis, I strongly discouraged him from using NSAIDs, which she has been using a few times a week for hip pain.  Patient was agreeable to referral to pulmonology for sleep disordered breathing.  2.  Coronary artery disease: Status post CABG x3 in 2013, with only 1 of 3 patent grafts on catheterization in 2015.  He has been doing well without chest pain or dyspnea.  Continue statin, beta-blocker, ACE inhibitor, and nitrate therapy.  No aspirin in the setting of chronic Eliquis.  3.  Essential hypertension: Stable on beta-blocker and ACE inhibitor therapy.  4.  Hyperlipidemia: LDL of 50 in March 2022.  Normal LFTs at that time.  5.  Type 2 diabetes mellitus: A1c 7.3 in October.  Managed by primary care with metformin therapy.  6.  Sleep disordered breathing: History of snoring.  STOP-BANG 5.  Will refer to pulmonology for further evaluation.  7.  Bilateral hip pain/osteoarthritis: Patient has been using ibuprofen a few times per week for hip pain.  In the setting of paroxysmal atrial fibrillation and long-term oral anticoagulation, I discouraged NSAID usage.  He will try Tylenol and otherwise follow-up with his primary care provider for management.  8.  Disposition: Follow-up lab work today.  Arrange for echo.  Follow-up with Dr. Quentin Ore to discuss antiarrhythmic versus ablative options.  Murray Hodgkins, NP 08/04/2021, 8:45 AM

## 2021-08-04 NOTE — Progress Notes (Signed)
Electrophysiology Office Note:    Date:  08/05/2021   ID:  [name] [name], DOB 10/28/1950, MRN [id number]  PCP:  Olin Hauser, DO  [place] Cardiologist:  Nelva Bush, MD  [facility] Electrophysiologist:  Vickie Epley, MD   Referring MD: Wilder Glade*   Chief Complaint: AF  History of Present Illness:    [name] [name] is a 71 y.o. male who presents for an evaluation of AF at the request of Ignacia Bayley. Their medical history includes CAD s/p CABG in 2013, HFpEF, HTN, hLD, DM, pAF. He last saw Ignacia Bayley on 08/04/2021.   He has previously undergone DCCV in 07/2020. He takes eliquis for stroke ppx. He was in AF at the appt with Gerald Stabs but was completely asymptomatic.   He confirms to me today that he is completely asymptomatic with his atrial fibrillation.  He takes his Eliquis without any bleeding issues.  He is very active.  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   Past Medical History:  Diagnosis Date   Allergy    Coronary artery disease 2013   a. [place] 2013 w/ severe 3-V CAD; b. s/p 4-V CABG in 2013 (LIMA-LAD, VG-D2, seq VG-PLA/PDA, VG-OM; c. LHC 2015 patent LIMA-LAD, occluded VGs, med Rx   Diastolic dysfunction    a. 07/2017 Echo: eF 50-55%, mild LVH. no rwma, Gr2 DD. Mildly dil LA/RA. Nl RV size/fxn.   Essential hypertension    Headache(784.0)    Hyperlipidemia LDL goal <70    PAF (paroxysmal atrial fibrillation) ([place])    a. noted post-operatively following CABG in 2013; b. recurrence 06/2017; c. CHADS2VASc => 4 (HTN, age x 1, DM, vascular disease)--> Eliquis; d. 07/2020 recurrent AFib-> DCCV (200J).   Type II diabetes mellitus ([place])     Past Surgical History:  Procedure Laterality Date   CARDIAC CATHETERIZATION  12/18/13   Occluded SVGs   CARDIOVERSION N/A 07/22/2020   Procedure: CARDIOVERSION;  Surgeon: Nelva Bush, MD;  Location: [facility];  Service: Cardiovascular;  Laterality: N/A;   CORONARY  ARTERY BYPASS GRAFT  04/28/2012   Procedure: CORONARY ARTERY BYPASS GRAFTING (CABG);  Surgeon: Ivin Poot, MD;  Location: [place];  Service: Open Heart Surgery;  Laterality: N/A;  Right saphenous vein used for bypass grafting. Five  bypasses performed including left mammary artery.    INGUINAL HERNIA REPAIR     right    LEFT HEART CATHETERIZATION WITH CORONARY ANGIOGRAM N/A 04/27/2012   Procedure: LEFT HEART CATHETERIZATION WITH CORONARY ANGIOGRAM;  Surgeon: Larey Dresser, MD;  Location: [facility] CATH LAB;  Service: Cardiovascular;  Laterality: N/A;   LEFT HEART CATHETERIZATION WITH CORONARY/GRAFT ANGIOGRAM N/A 12/18/2013   Procedure: LEFT HEART CATHETERIZATION WITH Beatrix Fetters;  Surgeon: Sinclair Grooms, MD;  Location: [facility] CATH LAB;  Service: Cardiovascular;  Laterality: N/A;    Current Medications: Current Meds  Medication Sig   acetaminophen (TYLENOL) 325 MG tablet Take 650 mg by mouth every 6 (six) hours as needed for headache.   atorvastatin (LIPITOR) 80 MG tablet TAKE 1 TABLET BY MOUTH AT 6PM AS DIRECTED   Coenzyme Q10 (CO Q 10) 100 MG CAPS Take 200 mg by mouth daily.   ELIQUIS 5 MG TABS tablet TAKE 1 TABLET BY MOUTH TWICE A DAY   fexofenadine (ALLEGRA) 180 MG tablet Take 180 mg by mouth daily.   fish oil-omega-3 fatty acids 1000 MG capsule Take 1 g by mouth daily.   isosorbide mononitrate (IMDUR)  30 MG 24 hr tablet TAKE 1 TABLET BY MOUTH EVERY DAY   lisinopril (ZESTRIL) 10 MG tablet TAKE 1 TABLET BY MOUTH EVERY DAY   metFORMIN (GLUCOPHAGE) 500 MG tablet Take 1 tablet (500 mg total) by mouth 2 (two) times daily with a meal.   metoprolol tartrate (LOPRESSOR) 50 MG tablet TAKE 1/2 TABLETS BY MOUTH 2 TIMES DAILY.   nitroGLYCERIN (NITROSTAT) 0.4 MG SL tablet Place 1 tablet (0.4 mg total) under the tongue every 5 (five) minutes as needed for chest pain.     Allergies:   Penicillins   Social History   Socioeconomic History   Marital status: Married    Spouse name: Not on  file   Number of children: Not on file   Years of education: Not on file   Highest education level: Not on file  Occupational History   Not on file  Tobacco Use   Smoking status: Never   Smokeless tobacco: Never  Vaping Use   Vaping Use: Never used  Substance and Sexual Activity   Alcohol use: Not Currently   Drug use: No   Sexual activity: Yes  Other Topics Concern   Not on file  Social History Narrative   Not on file   Social Determinants of Health   Financial Resource Strain: Not on file  Food Insecurity: Not on file  Transportation Needs: Not on file  Physical Activity: Not on file  Stress: Not on file  Social Connections: Not on file     Family History: The patient's family history includes CAD in his father and mother; Diabetes in his father; Heart attack in his father and mother. There is no history of Colon cancer or Prostate cancer.  ROS:   Please see the history of present illness.    All other systems reviewed and are negative.  EKGs/Labs/Other Studies Reviewed:    The following studies were reviewed today:  08/04/2021 Echo reviewed (no report in Epic) EF grossly normal  August 04, 2021 EKG shows atrial fibrillation with a ventricular rate of 94 bpm.  There is an incomplete right bundle branch block with a left anterior fascicular block.   Recent Labs: 09/11/2020: ALT 26; Hemoglobin 13.4; Platelets 207 08/04/2021: BUN 25; Creatinine, Ser 1.27; Magnesium 1.7; Potassium 4.6; Sodium 135; TSH 3.555  Recent Lipid Panel    Component Value Date/Time   CHOL 95 09/11/2020 0759   CHOL 121 07/06/2017 1425   TRIG 67 09/11/2020 0759   HDL 31 (L) 09/11/2020 0759   HDL 37 (L) 07/06/2017 1425   CHOLHDL 3.1 09/11/2020 0759   VLDL 19 06/02/2016 1632   LDLCALC 50 09/11/2020 0759    Physical Exam:    VS:  BP 138/80 (BP Location: Left Arm, Patient Position: Sitting, Cuff Size: Normal)    Pulse 94    Ht 6\' 4"  (1.93 m)    Wt 238 lb (108 kg)    SpO2 98%    BMI 28.97  kg/m     Wt Readings from Last 3 Encounters:  08/05/21 238 lb (108 kg)  08/04/21 239 lb (108.4 kg)  05/01/21 243 lb (110.2 kg)     GEN:  Well nourished, well developed in no acute distress HEENT: Normal NECK: No JVD; No carotid bruits LYMPHATICS: No lymphadenopathy CARDIAC: Irregularly irregular, no murmurs, rubs, gallops RESPIRATORY:  Clear to auscultation without rales, wheezing or rhonchi  ABDOMEN: Soft, non-tender, non-distended MUSCULOSKELETAL:  No edema; No deformity  SKIN: Warm and dry NEUROLOGIC:  Alert and oriented  x 3 PSYCHIATRIC:  Normal affect       ASSESSMENT:    1. Persistent atrial fibrillation ([place])   2. Coronary artery disease involving native coronary artery of native heart without angina pectoris   3. Essential hypertension   4. Type 2 diabetes mellitus without complication, without long-term current use of insulin ([facility])    PLAN:    In order of problems listed above:  #Persistent atrial fibrillation Rate controlled.  Asymptomatic.  Normal left ventricular function.  For now, would recommend continuing with a rate control strategy.  I suspect he is in permanent atrial fibrillation.  Continue Eliquis for stroke prophylaxis.  #Coronary artery disease No ischemic symptoms.  Continue current medications.  #Hypertension Controlled.  Continue current medications.  Follow-up with EP on an as-needed basis.  Medication Adjustments/Labs and Tests Ordered: Current medicines are reviewed at length with the patient today.  Concerns regarding medicines are outlined above.  No orders of the defined types were placed in this encounter.  No orders of the defined types were placed in this encounter.    Signed, Hilton Cork. Quentin Ore, MD, [facility], [facility] 08/05/2021 10:09 AM    Electrophysiology [facility]

## 2021-08-04 NOTE — Patient Instructions (Addendum)
Medication Instructions:  No changes at this time.  *If you need a refill on your cardiac medications before your next appointment, please call your pharmacy*   Lab Work: BMET, TSH, & Mag today. Go to [place] and check in at registration.   If you have labs (blood work) drawn today and your tests are completely normal, you will receive your results only by: [place] (if you have MyChart) OR A paper copy in the mail If you have any lab test that is abnormal or we need to change your treatment, we will call you to review the results.   Testing/Procedures: None   Follow-Up: At [facility], you and your health needs are our priority.  As part of our continuing mission to provide you with exceptional heart care, we have created designated Provider Care Teams.  These Care Teams include your primary Cardiologist (physician) and Advanced Practice Providers (APPs -  Physician Assistants and Nurse Practitioners) who all work together to provide you with the care you need, when you need it.   Your next appointment:   Follow up with Dr. Quentin Ore  The format for your next appointment:   In Person  Provider:   Lars Mage, MD    Other Instructions Referral placed for electrophysiologist (EP) to see Dr. Quentin Ore.   Referral placed for pulmonary.

## 2021-08-05 ENCOUNTER — Telehealth: Payer: Self-pay | Admitting: *Deleted

## 2021-08-05 ENCOUNTER — Encounter: Payer: Self-pay | Admitting: Cardiology

## 2021-08-05 ENCOUNTER — Ambulatory Visit: Payer: Medicare HMO | Admitting: Cardiology

## 2021-08-05 VITALS — BP 138/80 | HR 94 | Ht 76.0 in | Wt 238.0 lb

## 2021-08-05 DIAGNOSIS — I4819 Other persistent atrial fibrillation: Secondary | ICD-10-CM

## 2021-08-05 DIAGNOSIS — I2510 Atherosclerotic heart disease of native coronary artery without angina pectoris: Secondary | ICD-10-CM

## 2021-08-05 DIAGNOSIS — E119 Type 2 diabetes mellitus without complications: Secondary | ICD-10-CM

## 2021-08-05 DIAGNOSIS — I10 Essential (primary) hypertension: Secondary | ICD-10-CM

## 2021-08-05 LAB — ECHOCARDIOGRAM COMPLETE
AR max vel: 2.89 cm2
AV Area VTI: 2.84 cm2
AV Area mean vel: 2.89 cm2
AV Mean grad: 3 mmHg
AV Peak grad: 5 mmHg
Ao pk vel: 1.12 m/s
Area-P 1/2: 3.3 cm2
Calc EF: 49.2 %
Height: 76 in
S' Lateral: 3.8 cm
Single Plane A2C EF: 47.9 %
Single Plane A4C EF: 52.4 %
Weight: 3824 oz

## 2021-08-05 NOTE — Telephone Encounter (Signed)
-----   Message from Theora Gianotti, NP sent at 08/05/2021  2:05 PM EST ----- Normal heart squeezing function.  The heart muscle is mildly thickened, which has been noted before.  The mitral and aortic valves are mildly leaky, and these findings aren't significant at this time.  The aortic valve is also thickened but currently working normally.  Overall, reassuring findings.

## 2021-08-05 NOTE — Telephone Encounter (Signed)
Left voicemail message to call back for review of results.  

## 2021-08-05 NOTE — Telephone Encounter (Signed)
Results reviewed with patients wife per release form. She verbalized understanding with no further questions at this time.

## 2021-08-05 NOTE — Patient Instructions (Signed)
Medications: Your physician recommends that you continue on your current medications as directed. Please refer to the Current Medication list given to you today. *If you need a refill on your cardiac medications before your next appointment, please call your pharmacy*  Lab Work: None. If you have labs (blood work) drawn today and your tests are completely normal, you will receive your results only by: [place] (if you have MyChart) OR A paper copy in the mail If you have any lab test that is abnormal or we need to change your treatment, we will call you to review the results.  Testing/Procedures: None.  Follow-Up: At [facility], you and your health needs are our priority.  As part of our continuing mission to provide you with exceptional heart care, we have created designated Provider Care Teams.  These Care Teams include your primary Cardiologist (physician) and Advanced Practice Providers (APPs -  Physician Assistants and Nurse Practitioners) who all work together to provide you with the care you need, when you need it.  Your physician wants you to follow-up in: As needed with Lars Mage   We recommend signing up for the patient portal called "MyChart".  Sign up information is provided on this After Visit Summary.  MyChart is used to connect with patients for Virtual Visits (Telemedicine).  Patients are able to view lab/test results, encounter notes, upcoming appointments, etc.  Non-urgent messages can be sent to your provider as well.   To learn more about what you can do with MyChart, go to [email or web address].    Any Other Special Instructions Will Be Listed Below (If Applicable).

## 2021-08-10 ENCOUNTER — Other Ambulatory Visit: Payer: Self-pay | Admitting: Internal Medicine

## 2021-08-10 DIAGNOSIS — I10 Essential (primary) hypertension: Secondary | ICD-10-CM

## 2021-08-10 DIAGNOSIS — I480 Paroxysmal atrial fibrillation: Secondary | ICD-10-CM

## 2021-08-20 ENCOUNTER — Other Ambulatory Visit: Payer: Self-pay | Admitting: Internal Medicine

## 2021-08-24 ENCOUNTER — Other Ambulatory Visit: Payer: Self-pay | Admitting: Internal Medicine

## 2021-08-24 DIAGNOSIS — I10 Essential (primary) hypertension: Secondary | ICD-10-CM

## 2021-08-24 DIAGNOSIS — I480 Paroxysmal atrial fibrillation: Secondary | ICD-10-CM

## 2021-09-03 ENCOUNTER — Other Ambulatory Visit: Payer: Self-pay

## 2021-09-03 ENCOUNTER — Ambulatory Visit: Payer: Medicare HMO | Admitting: Pulmonary Disease

## 2021-09-03 ENCOUNTER — Encounter: Payer: Self-pay | Admitting: Pulmonary Disease

## 2021-09-03 VITALS — BP 132/74 | HR 72 | Temp 97.9°F | Ht 76.0 in | Wt 241.4 lb

## 2021-09-03 DIAGNOSIS — R0683 Snoring: Secondary | ICD-10-CM | POA: Diagnosis not present

## 2021-09-03 NOTE — Progress Notes (Signed)
? ?[facility] ? ?Chief Complaint  ?Patient presents with  ? sleep consult  ?  Per Murray Hodgkins, NP-c/o snoring and daytime sleepiness. No prior sleep study.   ? ? ?Past Surgical History:  ?He  has a past surgical history that includes Cardiac catheterization (12/18/13); Inguinal hernia repair; Coronary artery bypass graft (04/28/2012); left heart catheterization with coronary angiogram (N/A, 04/27/2012); left heart catheterization with coronary/graft angiogram (N/A, 12/18/2013); and Cardioversion (N/A, 07/22/2020). ? ?Past Medical History:  ?Allergies, CAD, Diastolic CHF, HTN, HA, HLD, PAF, DM type 2 ? ?Constitutional:  ?BP 132/74 (BP Location: Left Arm, Cuff Size: Normal)   Pulse 72   Temp 97.9 ?F (36.6 ?C) (Temporal)   Ht '6\' 4"'$  (1.93 m)   Wt 241 lb 6.4 oz (109.5 kg)   SpO2 99%   BMI 29.38 kg/m?  ? ?Brief Summary:  ?[name] [name] is a 71 y.o. male with snoring. ?  ? ? ? ?Subjective:  ? ?He is followed by cardiology.  Concern he could have sleep apnea.  He snores at night.  He has to sleep in a recliner or with his head elevated.  If he doesn't, then he feels his sinuses close up.  He gets frequent dreams at night.  He can fall asleep when sitting quiet. ? ?He goes to sleep at 11 pm.  He falls asleep in 30 minutes.  He wakes up 2 to 4 times to use the bathroom.  He gets out of bed at 7 am.  He feels okay in the morning.  He denies morning headache.  He does not use anything to help him fall sleep or stay awake. ? ?He denies sleep walking, sleep talking, bruxism, or nightmares.  There is no history of restless legs.  He denies sleep hallucinations, sleep paralysis, or cataplexy. ? ?The Epworth score is 4 out of 24. ? ? ?Physical Exam:  ? ?Appearance - well kempt  ? ?ENMT - no sinus tenderness, no oral exudate, no LAN, Mallampati 4 airway, no stridor, high arched palate ? ?Respiratory - equal breath sounds bilaterally, no wheezing or rales ? ?CV - s1s2 regular rate and  rhythm, no murmurs ? ?Ext - no clubbing, no edema ? ?Skin - no rashes ? ?Psych - normal mood and affect ?  ?Sleep Tests:  ? ? ?Cardiac Tests:  ?Echo 08/04/21 >> EF 60 to 65%, mild LVH, RVSP 26 mmHg, mild LA dilation, mild MR, mild AR ? ?Social History:  ?He  reports that he has never smoked. He has never used smokeless tobacco. He reports that he does not currently use alcohol. He reports that he does not use drugs. ? ?Family History:  ?His family history includes CAD in his father and mother; Diabetes in his father; Heart attack in his father and mother. ?  ? ?Discussion:  ?He has snoring, sleep disruption, apnea, and daytime sleepiness.  He has history of CAD, hypertension, atrial fibrillation and diabetes.  I am concerned he could have obstructive sleep apnea. ? ?Assessment/Plan:  ? ?Snoring with excessive daytime sleepiness. ?- will need to arrange for a home sleep study ? ?Obesity. ?- discussed how weight can impact sleep and risk for sleep disordered breathing ?- discussed options to assist with weight loss: combination of diet modification, cardiovascular and strength training exercises ? ?Cardiovascular risk. ?- had an extensive discussion regarding the adverse health consequences related to untreated sleep disordered breathing ?- specifically discussed the risks for hypertension, coronary artery disease, cardiac  dysrhythmias, cerebrovascular disease, and diabetes ?- lifestyle modification discussed ? ?Safe driving practices. ?- discussed how sleep disruption can increase risk of accidents, particularly when driving ?- safe driving practices were discussed ? ?Therapies for obstructive sleep apnea. ?- if the sleep study shows significant sleep apnea, then various therapies for treatment were reviewed: CPAP, oral appliance, and surgical interventions ? ?Time Spent Involved in Patient Care on Day of Examination:  ?38 minutes ? ?Follow up:  ? ?Patient Instructions  ?We are concerned you could have obstructive  sleep apnea.  This affects your breathing and oxygen while asleep.  People who have obstructive sleep apnea have higher risks of getting high blood pressure, heart disease, and irregular heart beats such as atrial fibrillation.  This can also affect your daytime alertness, memory, and concentration. ? ?Will arrange for home sleep study. ? ?Will call to arrange for follow up after sleep study reviewed. ? ? ?Medication List:  ? ?Allergies as of 09/03/2021   ? ?   Reactions  ? Penicillins Hives, Itching, Rash  ? ?  ? ?  ?Medication List  ?  ? ?  ? Accurate as of September 03, 2021 11:16 AM. If you have any questions, ask your nurse or doctor.  ?  ?  ? ?  ? ?acetaminophen 325 MG tablet ?Commonly known as: TYLENOL ?Take 650 mg by mouth every 6 (six) hours as needed for headache. ?  ?atorvastatin 80 MG tablet ?Commonly known as: LIPITOR ?TAKE 1 TABLET BY MOUTH AT 6PM AS DIRECTED ?  ?Co Q 10 100 MG Caps ?Take 200 mg by mouth daily. ?  ?Eliquis 5 MG Tabs tablet ?Generic drug: apixaban ?TAKE 1 TABLET BY MOUTH TWICE A DAY ?  ?fexofenadine 180 MG tablet ?Commonly known as: ALLEGRA ?Take 180 mg by mouth daily. ?  ?fish oil-omega-3 fatty acids 1000 MG capsule ?Take 1 g by mouth daily. ?  ?isosorbide mononitrate 30 MG 24 hr tablet ?Commonly known as: IMDUR ?TAKE 1 TABLET BY MOUTH EVERY DAY ?  ?lisinopril 10 MG tablet ?Commonly known as: ZESTRIL ?TAKE 1 TABLET BY MOUTH EVERY DAY ?  ?metFORMIN 500 MG tablet ?Commonly known as: GLUCOPHAGE ?Take 1 tablet (500 mg total) by mouth 2 (two) times daily with a meal. ?  ?metoprolol tartrate 50 MG tablet ?Commonly known as: LOPRESSOR ?TAKE 1/2 TABLETS BY MOUTH 2 TIMES DAILY. ?  ?nitroGLYCERIN 0.4 MG SL tablet ?Commonly known as: Nitrostat ?Place 1 tablet (0.4 mg total) under the tongue every 5 (five) minutes as needed for chest pain. ?  ? ?  ? ? ?Signature:  ?Chesley Mires, MD ?[place] ?Pager - [phone] ?09/03/2021, 11:16 AM ?  ? ? ? ? ? ? ? ? ?

## 2021-09-03 NOTE — Patient Instructions (Signed)
We are concerned you could have obstructive sleep apnea.  This affects your breathing and oxygen while asleep.  People who have obstructive sleep apnea have higher risks of getting high blood pressure, heart disease, and irregular heart beats such as atrial fibrillation.  This can also affect your daytime alertness, memory, and concentration. ? ?Will arrange for home sleep study. ? ?Will call to arrange for follow up after sleep study reviewed. ? ?

## 2021-09-15 ENCOUNTER — Other Ambulatory Visit: Payer: Medicare HMO

## 2021-09-15 DIAGNOSIS — Z0000 Encounter for general adult medical examination without abnormal findings: Secondary | ICD-10-CM | POA: Diagnosis not present

## 2021-09-15 DIAGNOSIS — I129 Hypertensive chronic kidney disease with stage 1 through stage 4 chronic kidney disease, or unspecified chronic kidney disease: Secondary | ICD-10-CM | POA: Diagnosis not present

## 2021-09-15 DIAGNOSIS — R351 Nocturia: Secondary | ICD-10-CM | POA: Diagnosis not present

## 2021-09-15 DIAGNOSIS — E1169 Type 2 diabetes mellitus with other specified complication: Secondary | ICD-10-CM

## 2021-09-15 DIAGNOSIS — N182 Chronic kidney disease, stage 2 (mild): Secondary | ICD-10-CM | POA: Diagnosis not present

## 2021-09-15 DIAGNOSIS — E1121 Type 2 diabetes mellitus with diabetic nephropathy: Secondary | ICD-10-CM | POA: Diagnosis not present

## 2021-09-15 DIAGNOSIS — E785 Hyperlipidemia, unspecified: Secondary | ICD-10-CM | POA: Diagnosis not present

## 2021-09-15 DIAGNOSIS — I25708 Atherosclerosis of coronary artery bypass graft(s), unspecified, with other forms of angina pectoris: Secondary | ICD-10-CM | POA: Diagnosis not present

## 2021-09-16 LAB — COMPLETE METABOLIC PANEL WITH GFR
AG Ratio: 1.5 (calc) (ref 1.0–2.5)
ALT: 23 U/L (ref 9–46)
AST: 22 U/L (ref 10–35)
Albumin: 4.3 g/dL (ref 3.6–5.1)
Alkaline phosphatase (APISO): 106 U/L (ref 35–144)
BUN: 19 mg/dL (ref 7–25)
CO2: 28 mmol/L (ref 20–32)
Calcium: 10.2 mg/dL (ref 8.6–10.3)
Chloride: 102 mmol/L (ref 98–110)
Creat: 1.19 mg/dL (ref 0.70–1.35)
Globulin: 2.9 g/dL (calc) (ref 1.9–3.7)
Glucose, Bld: 178 mg/dL — ABNORMAL HIGH (ref 65–99)
Potassium: 4.6 mmol/L (ref 3.5–5.3)
Sodium: 139 mmol/L (ref 135–146)
Total Bilirubin: 0.6 mg/dL (ref 0.2–1.2)
Total Protein: 7.2 g/dL (ref 6.1–8.1)
eGFR: 66 mL/min/{1.73_m2} (ref >=60)

## 2021-09-16 LAB — LIPID PANEL
Cholesterol: 181 mg/dL (ref <200)
HDL: 43 mg/dL (ref >=40)
LDL Cholesterol (Calc): 112 mg/dL (calc) — ABNORMAL HIGH
Non-HDL Cholesterol (Calc): 138 mg/dL (calc) — ABNORMAL HIGH (ref <130)
Total CHOL/HDL Ratio: 4.2 (calc) (ref <5.0)
Triglycerides: 138 mg/dL (ref <150)

## 2021-09-16 LAB — CBC WITH DIFFERENTIAL/PLATELET
Absolute Monocytes: 748 cells/uL (ref 200–950)
Basophils Absolute: 60 cells/uL (ref 0–200)
Basophils Relative: 0.7 %
Eosinophils Absolute: 196 cells/uL (ref 15–500)
Eosinophils Relative: 2.3 %
HCT: 45 % (ref 38.5–50.0)
Hemoglobin: 14.7 g/dL (ref 13.2–17.1)
Lymphs Abs: 2057 cells/uL (ref 850–3900)
MCH: 29.7 pg (ref 27.0–33.0)
MCHC: 32.7 g/dL (ref 32.0–36.0)
MCV: 90.9 fL (ref 80.0–100.0)
MPV: 12 fL (ref 7.5–12.5)
Monocytes Relative: 8.8 %
Neutro Abs: 5440 cells/uL (ref 1500–7800)
Neutrophils Relative %: 64 %
Platelets: 230 10*3/uL (ref 140–400)
RBC: 4.95 10*6/uL (ref 4.20–5.80)
RDW: 13.3 % (ref 11.0–15.0)
Total Lymphocyte: 24.2 %
WBC: 8.5 10*3/uL (ref 3.8–10.8)

## 2021-09-16 LAB — HEMOGLOBIN A1C
Hgb A1c MFr Bld: 8.5 % of total Hgb — ABNORMAL HIGH (ref <5.7)
Mean Plasma Glucose: 197 mg/dL
eAG (mmol/L): 10.9 mmol/L

## 2021-09-16 LAB — PSA: PSA: 2.06 ng/mL (ref <=4.00)

## 2021-09-22 ENCOUNTER — Ambulatory Visit (INDEPENDENT_AMBULATORY_CARE_PROVIDER_SITE_OTHER): Payer: Medicare HMO | Admitting: Family Medicine

## 2021-09-22 ENCOUNTER — Encounter: Payer: Self-pay | Admitting: Family Medicine

## 2021-09-22 VITALS — BP 124/68 | HR 65 | Ht 76.0 in | Wt 242.6 lb

## 2021-09-22 DIAGNOSIS — I129 Hypertensive chronic kidney disease with stage 1 through stage 4 chronic kidney disease, or unspecified chronic kidney disease: Secondary | ICD-10-CM

## 2021-09-22 DIAGNOSIS — E1121 Type 2 diabetes mellitus with diabetic nephropathy: Secondary | ICD-10-CM

## 2021-09-22 DIAGNOSIS — I4819 Other persistent atrial fibrillation: Secondary | ICD-10-CM | POA: Diagnosis not present

## 2021-09-22 DIAGNOSIS — Z0000 Encounter for general adult medical examination without abnormal findings: Secondary | ICD-10-CM

## 2021-09-22 DIAGNOSIS — E1169 Type 2 diabetes mellitus with other specified complication: Secondary | ICD-10-CM

## 2021-09-22 DIAGNOSIS — I25118 Atherosclerotic heart disease of native coronary artery with other forms of angina pectoris: Secondary | ICD-10-CM

## 2021-09-22 DIAGNOSIS — E785 Hyperlipidemia, unspecified: Secondary | ICD-10-CM | POA: Diagnosis not present

## 2021-09-22 DIAGNOSIS — N182 Chronic kidney disease, stage 2 (mild): Secondary | ICD-10-CM | POA: Diagnosis not present

## 2021-09-22 MED ORDER — METFORMIN HCL 500 MG PO TABS: 1000.0000 mg | ORAL_TABLET | Freq: Two times a day (BID) | ORAL | 3 refills | Status: DC

## 2021-09-22 NOTE — Patient Instructions (Addendum)
Thank you for coming to the office today. ? ?Dose increase Metformin '500mg'$  x 2 per dose = '1000mg'$  twice a day with meal. You might want to start with 1 in morning and 2 in evening then eventually go to 2 and 2. ? ?I recommend that you schedule with an eye doctor for a Diabetic Eye Exam. Send [place] a copy of the result. ? ?Urine test today for protein kidney health. ? ?We can consider a new medication for Diabetes at next visit if still not doing as well. ? ?Recent Labs  ?  03/17/21 ?0354 09/15/21 ?0801  ?HGBA1C 7.3* 8.5*  ? ? ? ?Please schedule a Follow-up Appointment to: Return in about 4 months (around 01/22/2022) for 4 month follow-up DM A1c med adjust. ? ?If you have any other questions or concerns, please feel free to call the office or send a message through [place]. You may also schedule an earlier appointment if necessary. ? ?Additionally, you may be receiving a survey about your experience at our office within a few days to 1 week by e-mail or mail. We value your feedback. ? ?Nobie Putnam, DO ?[place] ?

## 2021-09-22 NOTE — Progress Notes (Signed)
? ?Subjective:  ? ? Patient ID: [name] [name], male    DOB: 1950/07/28, 71 y.o.   MRN: [id number] ? ?[name] [name] is a 71 y.o. male presenting on 09/22/2021 for Annual Exam ? ? ?HPI ? ?Here for Annual Physical and lab review. ? ?Type 2 Diabetes, with Nephropathy CKD III ?Overweight BMI >29 ?See prior notes for background information. ?- Today doing well. ?A1c is elevated to 8.5 previous 7 range. ?Last lab shows Creatinine 1.19 (improved from prior 1.2 to 1.4 range) ?CBGs: No CBGs ?Meds: Metformin '500mg'$  BID - tolerating well without side effects ?Currently on ACEi ?Lifestyle: ?- Diet (improving diabetic diet, add green tea, adding more water to daily diet,  ?- Exercise (improved exercise outdoors, yard work he is tolerating exertion even better, stationary bike) ?- Father with diabetes ?Needs to schedule DM Eye Exam. ?Denies hypoglycemia, visual changes, numbness or tingling, polyuria ?   ?CHRONIC HTN: ?Reports no new concerns ?Follows with Cardiology ?He still has chronic bradycardia. ?Current Meds - Lisinopril '10mg'$  daily, Imdur '30mg'$  daily, Metoprolol '25mg'$  BID ?Reports good compliance, took meds today. Tolerating well, w/o complaints. ?Denies CP, dyspnea, HA, edema, dizziness / lightheadedness ?  ?CAD s/p CABG 2013 LHC with severe 3 vessel disease ?Persistent Atrial Fibrillation ?Followed by [facility] ?Recent updates in 07/2020 he had cardioversion for paroxysmal atrial fibrillation, very successful procedure, he has maintained sinus rhythm since ?Continues on anticoagulation ?Continues rate control ?- Taking Eliquis '5mg'$  BID, high intensity statin, ASA, omega 3 fish oil, imdur, ACEi, BB ?- has NTG SL. Not using it. ?Off Aspirin ? ?   ?HYPERLIPIDEMIA: ?- Reports no concerns. Last lipid panel 09/2021, mild elevated LDL >110 ?- Currently taking Atorvastatin '80mg'$  daily, tolerating well without side effects or myalgias ? ?Osteoarthritis multiple joints ?Reports has episodic days of stiffness and soreness hips and  other joints. ?Taking OTC for it with relief. ? ?Updates: ? ?Has seen Pulmonology, waiting on scheduled Sleep Apnea. ? ?  ?Health Maintenance: ?[facility] - Dr Cristina Gong - Colonoscopy 05/2019 - tubular adenoma x 3 - benign, next repeat in 3 years 05/2022 ?  ?UTD COVID Moderna ?  ?PSA screening last result 2.06 - previous 1.73 down from prior 2.26 (09/2020), previous range 0.9 to 1.4 ?  ? ? ?  09/22/2021  ?  9:19 AM 03/23/2021  ?  8:41 AM 03/19/2020  ?  8:06 AM  ?Depression screen PHQ 2/9  ?Decreased Interest 0 0 0  ?Down, Depressed, Hopeless 0 0 0  ?PHQ - 2 Score 0 0 0  ?Altered sleeping 0    ?Tired, decreased energy 0    ?Change in appetite 0    ?Feeling bad or failure about yourself  0    ?Trouble concentrating 0    ?Moving slowly or fidgety/restless 0    ?Suicidal thoughts 0    ?PHQ-9 Score 0    ?Difficult doing work/chores Not difficult at all    ? ? ?Past Medical History:  ?Diagnosis Date  ? Allergy   ? Coronary artery disease 2013  ? a. [place] 2013 w/ severe 3-V CAD; b. s/p 4-V CABG in 2013 (LIMA-LAD, VG-D2, seq VG-PLA/PDA, VG-OM; c. LHC 2015 patent LIMA-LAD, occluded VGs, med Rx  ? Diastolic dysfunction   ? a. 07/2017 Echo: eF 50-55%, mild LVH. no rwma, Gr2 DD. Mildly dil LA/RA. Nl RV size/fxn.  ? Essential hypertension   ? Headache(784.0)   ? Hyperlipidemia LDL goal <70   ? PAF (paroxysmal atrial fibrillation) ([place])   ?  a. noted post-operatively following CABG in 2013; b. recurrence 06/2017; c. CHADS2VASc => 4 (HTN, age x 1, DM, vascular disease)--> Eliquis; d. 07/2020 recurrent AFib-> DCCV (200J).  ? Type II diabetes mellitus ([place])   ? ?Past Surgical History:  ?Procedure Laterality Date  ? CARDIAC CATHETERIZATION  12/18/13  ? Occluded SVGs  ? CARDIOVERSION N/A 07/22/2020  ? Procedure: CARDIOVERSION;  Surgeon: Nelva Bush, MD;  Location: [facility];  Service: Cardiovascular;  Laterality: N/A;  ? CORONARY ARTERY BYPASS GRAFT  04/28/2012  ? Procedure: CORONARY ARTERY BYPASS GRAFTING (CABG);  Surgeon: Ivin Poot, MD;   Location: [place];  Service: Open Heart Surgery;  Laterality: N/A;  Right saphenous vein used for bypass grafting. Five  bypasses performed including left mammary artery.   ? INGUINAL HERNIA REPAIR    ? right   ? LEFT HEART CATHETERIZATION WITH CORONARY ANGIOGRAM N/A 04/27/2012  ? Procedure: LEFT HEART CATHETERIZATION WITH CORONARY ANGIOGRAM;  Surgeon: Larey Dresser, MD;  Location: [facility] CATH LAB;  Service: Cardiovascular;  Laterality: N/A;  ? LEFT HEART CATHETERIZATION WITH CORONARY/GRAFT ANGIOGRAM N/A 12/18/2013  ? Procedure: LEFT HEART CATHETERIZATION WITH Beatrix Fetters;  Surgeon: Sinclair Grooms, MD;  Location: [facility] CATH LAB;  Service: Cardiovascular;  Laterality: N/A;  ? ?Social History  ? ?Socioeconomic History  ? Marital status: Married  ?  Spouse name: Not on file  ? Number of children: Not on file  ? Years of education: Not on file  ? Highest education level: Not on file  ?Occupational History  ? Not on file  ?Tobacco Use  ? Smoking status: Never  ? Smokeless tobacco: Never  ?Vaping Use  ? Vaping Use: Never used  ?Substance and Sexual Activity  ? Alcohol use: Not Currently  ? Drug use: No  ? Sexual activity: Yes  ?Other Topics Concern  ? Not on file  ?Social History Narrative  ? Not on file  ? ?Social Determinants of Health  ? ?Financial Resource Strain: Not on file  ?Food Insecurity: Not on file  ?Transportation Needs: Not on file  ?Physical Activity: Not on file  ?Stress: Not on file  ?Social Connections: Not on file  ?Intimate Partner Violence: Not on file  ? ?Family History  ?Problem Relation Age of Onset  ? CAD Mother   ? Heart attack Mother   ? CAD Father   ? Diabetes Father   ? Heart attack Father   ? Colon cancer Neg Hx   ? Prostate cancer Neg Hx   ? ?Current Outpatient Medications on File Prior to Visit  ?Medication Sig  ? acetaminophen (TYLENOL) 325 MG tablet Take 650 mg by mouth every 6 (six) hours as needed for headache.  ? atorvastatin (LIPITOR) 80 MG tablet TAKE 1 TABLET BY MOUTH AT 6PM  AS DIRECTED  ? Coenzyme Q10 (CO Q 10) 100 MG CAPS Take 200 mg by mouth daily.  ? ELIQUIS 5 MG TABS tablet TAKE 1 TABLET BY MOUTH TWICE A DAY  ? fexofenadine (ALLEGRA) 180 MG tablet Take 180 mg by mouth daily.  ? fish oil-omega-3 fatty acids 1000 MG capsule Take 1 g by mouth daily.  ? lisinopril (ZESTRIL) 10 MG tablet TAKE 1 TABLET BY MOUTH EVERY DAY  ? metoprolol tartrate (LOPRESSOR) 50 MG tablet TAKE 1/2 TABLETS BY MOUTH 2 TIMES DAILY.  ? nitroGLYCERIN (NITROSTAT) 0.4 MG SL tablet Place 1 tablet (0.4 mg total) under the tongue every 5 (five) minutes as needed for chest pain.  ? isosorbide mononitrate (IMDUR)  30 MG 24 hr tablet TAKE 1 TABLET BY MOUTH EVERY DAY  ? ?No current facility-administered medications on file prior to visit.  ? ? ?Review of Systems  ?Constitutional:  Negative for activity change, appetite change, chills, diaphoresis, fatigue and fever.  ?HENT:  Negative for congestion and hearing loss.   ?Eyes:  Negative for visual disturbance.  ?Respiratory:  Negative for cough, chest tightness, shortness of breath and wheezing.   ?Cardiovascular:  Negative for chest pain, palpitations and leg swelling.  ?Gastrointestinal:  Negative for abdominal pain, constipation, diarrhea, nausea and vomiting.  ?Genitourinary:  Negative for dysuria, frequency and hematuria.  ?Musculoskeletal:  Negative for arthralgias and neck pain.  ?Skin:  Negative for rash.  ?Neurological:  Negative for dizziness, weakness, light-headedness, numbness and headaches.  ?Hematological:  Negative for adenopathy.  ?Psychiatric/Behavioral:  Negative for behavioral problems, dysphoric mood and sleep disturbance.   ?Per HPI unless specifically indicated above ? ? ?   ?Objective:  ?  ?BP 124/68   Pulse 65   Ht '6\' 4"'$  (1.93 m)   Wt 242 lb 9.6 oz (110 kg)   SpO2 99%   BMI 29.53 kg/m?   ?Wt Readings from Last 3 Encounters:  ?09/22/21 242 lb 9.6 oz (110 kg)  ?09/03/21 241 lb 6.4 oz (109.5 kg)  ?08/05/21 238 lb (108 kg)  ?  ?Physical Exam ?Vitals  and nursing note reviewed.  ?Constitutional:   ?   General: He is not in acute distress. ?   Appearance: He is well-developed. He is not diaphoretic.  ?   Comments: Well-appearing, comfortable, cooperative

## 2021-09-23 LAB — MICROALBUMIN / CREATININE URINE RATIO
Creatinine, Urine: 62 mg/dL (ref 20–320)
Microalb Creat Ratio: 6 mcg/mg creat (ref <30)
Microalb, Ur: 0.4 mg/dL

## 2021-09-23 NOTE — Assessment & Plan Note (Signed)
Controlled HTN ?Complicated by CAD, CKD-II ? ?Improved Creatinine on last lab GFR >69 ? ?  ?Plan:  ?1. Continue current BP regimen - Metoprolol '25mg'$  BID, Lisiniopril '10mg'$  daily, Isosorbide mononite ?2. Encourage improved lifestyle - low sodium diet, regular exercise ?3. Continue monitor BP outside office, bring readings to next visit, if persistently >140/90 or new symptoms notify office sooner ?

## 2021-09-23 NOTE — Assessment & Plan Note (Signed)
Stable, sinus rhythm s/p cardioversion 07/2020 ?Bradycardia, on lower dose BB Metoprolol 25 BID now ?On Anticoagulation still Eliquis ?

## 2021-09-23 NOTE — Assessment & Plan Note (Signed)
Elevated A1c 8.5 ?Complications - CKD-II, hyperlipidemia, CAD, obesity - increases risk of future cardiovascular complications  ? ?Plan:  ?1. Increase Metformin to 1000 BID ?- Future consider injectable GLP1 meds, due to CKD limit other options ?2. Encourage improved lifestyle - low carb, low sugar diet, reduce portion size, continue improving and start more regular exercise ?3. Check CBG, bring log to next visit for review ?4. Continue ASA, ACEi, Statin ?5. Advised to schedule DM ophtho exam, send record - [place] ?DM [place] ?

## 2021-11-06 DIAGNOSIS — H2513 Age-related nuclear cataract, bilateral: Secondary | ICD-10-CM | POA: Diagnosis not present

## 2021-11-06 DIAGNOSIS — E119 Type 2 diabetes mellitus without complications: Secondary | ICD-10-CM | POA: Diagnosis not present

## 2021-11-06 LAB — HM DIABETES EYE EXAM

## 2021-11-10 ENCOUNTER — Other Ambulatory Visit: Payer: Self-pay | Admitting: Nurse Practitioner

## 2021-11-10 DIAGNOSIS — I480 Paroxysmal atrial fibrillation: Secondary | ICD-10-CM

## 2021-11-10 DIAGNOSIS — I10 Essential (primary) hypertension: Secondary | ICD-10-CM

## 2021-11-20 ENCOUNTER — Other Ambulatory Visit: Payer: Self-pay | Admitting: Internal Medicine

## 2022-01-16 ENCOUNTER — Other Ambulatory Visit: Payer: Self-pay | Admitting: Internal Medicine

## 2022-01-16 DIAGNOSIS — I4819 Other persistent atrial fibrillation: Secondary | ICD-10-CM

## 2022-01-18 NOTE — Telephone Encounter (Signed)
Refill request

## 2022-01-18 NOTE — Telephone Encounter (Signed)
Eliquis '5mg'$  refill request received. Patient is 71 years old, weight-110kg, Crea-1.19 on 09/15/2021, Diagnosis-Afib, and last seen by Dr. Quentin Ore on 08/05/2021. Dose is appropriate based on dosing criteria. Will send in refill to requested pharmacy.

## 2022-01-25 ENCOUNTER — Other Ambulatory Visit: Payer: Self-pay | Admitting: Family Medicine

## 2022-01-25 ENCOUNTER — Ambulatory Visit (INDEPENDENT_AMBULATORY_CARE_PROVIDER_SITE_OTHER): Payer: Medicare HMO | Admitting: Family Medicine

## 2022-01-25 ENCOUNTER — Encounter: Payer: Self-pay | Admitting: Family Medicine

## 2022-01-25 VITALS — BP 126/69 | HR 60 | Ht 76.0 in | Wt 230.2 lb

## 2022-01-25 DIAGNOSIS — R351 Nocturia: Secondary | ICD-10-CM

## 2022-01-25 DIAGNOSIS — E785 Hyperlipidemia, unspecified: Secondary | ICD-10-CM | POA: Diagnosis not present

## 2022-01-25 DIAGNOSIS — E1169 Type 2 diabetes mellitus with other specified complication: Secondary | ICD-10-CM

## 2022-01-25 DIAGNOSIS — I4819 Other persistent atrial fibrillation: Secondary | ICD-10-CM

## 2022-01-25 DIAGNOSIS — I25118 Atherosclerotic heart disease of native coronary artery with other forms of angina pectoris: Secondary | ICD-10-CM

## 2022-01-25 DIAGNOSIS — I129 Hypertensive chronic kidney disease with stage 1 through stage 4 chronic kidney disease, or unspecified chronic kidney disease: Secondary | ICD-10-CM | POA: Diagnosis not present

## 2022-01-25 DIAGNOSIS — N182 Chronic kidney disease, stage 2 (mild): Secondary | ICD-10-CM

## 2022-01-25 DIAGNOSIS — E1121 Type 2 diabetes mellitus with diabetic nephropathy: Secondary | ICD-10-CM

## 2022-01-25 DIAGNOSIS — Z0000 Encounter for general adult medical examination without abnormal findings: Secondary | ICD-10-CM

## 2022-01-25 LAB — POCT GLYCOSYLATED HEMOGLOBIN (HGB A1C): Hemoglobin A1C: 7 % — AB (ref 4.0–5.6)

## 2022-01-25 NOTE — Assessment & Plan Note (Signed)
Controlled HTN Complicated by CAD, CKD-II   Plan:  1. Continue current BP regimen - Metoprolol '25mg'$  BID, Lisiniopril '10mg'$  daily, Isosorbide mononite 2. Encourage improved lifestyle - low sodium diet, regular exercise 3. Continue monitor BP outside office, bring readings to next visit, if persistently >140/90 or new symptoms notify office sooner

## 2022-01-25 NOTE — Progress Notes (Signed)
Subjective:    Patient ID: [name] [name], male    DOB: [birth year], 71 y.o.   MRN: [id number]  [name] [name] is a 71 y.o. male presenting on 01/25/2022 for Diabetes   HPI  Type 2 Diabetes, with Nephropathy CKD III Overweight BMI >28 See prior notes for background information. - Today doing well. A1c down to 7.0% today Last lab shows Creatinine 1.19 (improved from prior 1.2 to 1.4 range) CBGs: No CBGs Meds: Metformin '1000mg'$  BID - tolerating well without side effects Currently on ACEi Lifestyle: - Diet (improving diabetic diet, add green tea, adding more water to daily diet,  - Exercise (improved exercise outdoors, yard work he is tolerating exertion even better, stationary bike) - Father with diabetes Recently updated DM Eye Exam. [name] [name] 11/06/21 Denies hypoglycemia, visual changes, numbness or tingling, polyuria    CHRONIC HTN: Reports no new concerns Follows with Cardiology He still has chronic bradycardia. Current Meds - Lisinopril '10mg'$  daily, Imdur '30mg'$  daily, Metoprolol '50mg'$  BID Reports good compliance, took meds today. Tolerating well, w/o complaints. Denies CP, dyspnea, HA, edema, dizziness / lightheadedness   CAD s/p CABG 2013 LHC with severe 3 vessel disease Persistent Atrial Fibrillation Followed by [facility] Recent updates in 07/2020 he had cardioversion for paroxysmal atrial fibrillation, very successful procedure, he has maintained sinus rhythm since Continues on anticoagulation Continues rate control - Taking Eliquis '5mg'$  BID, high intensity statin, ASA, omega 3 fish oil, imdur, ACEi, BB - has NTG SL. Not using it. Off Aspirin      HYPERLIPIDEMIA: - Reports no concerns. Last lipid panel 09/2021, mild elevated LDL >110 - Currently taking Atorvastatin '80mg'$  daily, tolerating well without side effects or myalgias   Osteoarthritis multiple joints Reports has episodic days of stiffness and soreness hips and other joints. Taking OTC for it with  relief.   Health Maintenance:  Future we can consider Cologuard vs Colonoscopy [facility] 05/2022     01/25/2022    9:50 AM 09/22/2021    9:19 AM 03/23/2021    8:41 AM  Depression screen PHQ 2/9  Decreased Interest 0 0 0  Down, Depressed, Hopeless 0 0 0  PHQ - 2 Score 0 0 0  Altered sleeping 0 0   Tired, decreased energy 0 0   Change in appetite 0 0   Feeling bad or failure about yourself  0 0   Trouble concentrating 0 0   Moving slowly or fidgety/restless 0 0   Suicidal thoughts 0 0   PHQ-9 Score 0 0   Difficult doing work/chores Not difficult at all Not difficult at all     Social History   Tobacco Use   Smoking status: Never   Smokeless tobacco: Never  Vaping Use   Vaping Use: Never used  Substance Use Topics   Alcohol use: Not Currently   Drug use: No    Review of Systems Per HPI unless specifically indicated above     Objective:    BP 126/69   Pulse 60   Ht '6\' 4"'$  (1.93 m)   Wt 230 lb 3.2 oz (104.4 kg)   SpO2 100%   BMI 28.02 kg/m   Wt Readings from Last 3 Encounters:  01/25/22 230 lb 3.2 oz (104.4 kg)  09/22/21 242 lb 9.6 oz (110 kg)  09/03/21 241 lb 6.4 oz (109.5 kg)    Physical Exam Vitals and nursing note reviewed.  Constitutional:      General: He is not in acute distress.  Appearance: Normal appearance. He is well-developed. He is not diaphoretic.     Comments: Well-appearing, comfortable, cooperative  HENT:     Head: Normocephalic and atraumatic.  Eyes:     General:        Right eye: No discharge.        Left eye: No discharge.     Conjunctiva/sclera: Conjunctivae normal.  Neck:     Thyroid: No thyromegaly.  Cardiovascular:     Rate and Rhythm: Normal rate and regular rhythm.     Pulses: Normal pulses.     Heart sounds: Normal heart sounds. No murmur heard. Pulmonary:     Effort: Pulmonary effort is normal. No respiratory distress.     Breath sounds: Normal breath sounds. No wheezing or rales.  Musculoskeletal:        General:  Normal range of motion.     Cervical back: Normal range of motion and neck supple.  Lymphadenopathy:     Cervical: No cervical adenopathy.  Skin:    General: Skin is warm and dry.     Findings: No erythema or rash.  Neurological:     Mental Status: He is alert and oriented to person, place, and time. Mental status is at baseline.  Psychiatric:        Mood and Affect: Mood normal.        Behavior: Behavior normal.        Thought Content: Thought content normal.     Comments: Well groomed, good eye contact, normal speech and thoughts      Recent Labs    03/17/21 0854 09/15/21 0801 01/25/22 0935  HGBA1C 7.3* 8.5* 7.0*     Results for orders placed or performed in visit on 01/25/22  POCT HgB A1C  Result Value Ref Range   Hemoglobin A1C 7.0 (A) 4.0 - 5.6 %  HM DIABETES EYE EXAM  Result Value Ref Range   HM Diabetic Eye Exam No Retinopathy No Retinopathy      Assessment & Plan:   Problem List Items Addressed This Visit     Benign hypertension with CKD (chronic kidney disease), stage II    Controlled HTN Complicated by CAD, CKD-II   Plan:  1. Continue current BP regimen - Metoprolol '25mg'$  BID, Lisiniopril '10mg'$  daily, Isosorbide mononite 2. Encourage improved lifestyle - low sodium diet, regular exercise 3. Continue monitor BP outside office, bring readings to next visit, if persistently >140/90 or new symptoms notify office sooner      Hyperlipidemia associated with type 2 diabetes mellitus ([place])    Well controlled cholesterol on statin  Last lipid panel 09/2020 Known CAD CABG  Plan: 1. Continue current meds - Atorvastatin '80mg'$  daily, Fish Oil omega 3 2. Continue ASA '81mg'$  for secondary ASCVD risk reduction 3. Encourage improved lifestyle - low carb/cholesterol, reduce portion size, continue improving regular exercise      Type 2 diabetes mellitus with diabetic nephropathy (HCC) - Primary    Improved M6Q to 7.0 Complications - CKD-II, hyperlipidemia, CAD, obesity  - increases risk of future cardiovascular complications   Plan:  1. Cotninue Metformin to 1000 BID - Future consider injectable GLP1 meds, due to CKD limit other options 2. Encourage improved lifestyle - low carb, low sugar diet, reduce portion size, continue improving and start more regular exercise 3. Check CBG, bring log to next visit for review 4. Continue ASA, ACEi, Statin      Relevant Orders   POCT HgB A1C (Completed)    No orders  of the defined types were placed in this encounter.     Follow up plan: Return in about 8 months (around 09/26/2022) for 8 fasting lab only then 1 week later Annual Physical.  Future labs ordered for 09/20/22  Nobie Putnam, [facility] 01/25/2022, 9:32 AM

## 2022-01-25 NOTE — Patient Instructions (Addendum)
Thank you for coming to the office today.  Recommend return to Dr End - Cardiologist in November 2023  Future Colon Cancer testing - either Colonoscopy at [facility] 05/2022 OR we could order the home kit Cologuard.  [facility] Address: [address] [place], [place] [address] Phone: [phone]  If you prefer that I order it - I will order it. Let me know  Colon Cancer Screening: - For all adults age 44+ routine colon cancer screening is highly recommended.     - Recent guidelines from [place] recommend starting age of 67 - Early detection of colon cancer is important, because often there are no warning signs or symptoms, also if found early usually it can be cured. Late stage is hard to treat.  - If you are not interested in Colonoscopy screening (if done and normal you could be cleared for 5 to 10 years until next due), then Cologuard is an excellent alternative for screening test for Colon Cancer. It is highly sensitive for detecting DNA of colon cancer from even the earliest stages. Also, there is NO bowel prep required. - If Cologuard is NEGATIVE, then it is good for 3 years before next due - If Cologuard is POSITIVE, then it is strongly advised to get a Colonoscopy, which allows the GI doctor to locate the source of the cancer or polyp (even very early stage) and treat it by removing it. ------------------------- If you would like to proceed with Cologuard (stool DNA test) - FIRST, call your insurance company and tell them you want to check cost of Cologuard tell them CPT Code [phone] (it may be completely covered and you could get for no cost, OR max cost without any coverage is about $600). Also, keep in mind if you do NOT open the kit, and decide not to do the test, you will NOT be charged, you should contact the company if you decide not to do the test. - If you want to proceed, you can notify us (phone message, [place], or at next visit) and we  will order it for you. The test kit will be delivered to you house within about 1 week. Follow instructions to collect sample, you may call the company for any help or questions, 24/7 telephone support at [phone].    DUE for FASTING BLOOD WORK (no food or drink after midnight before the lab appointment, only water or coffee without cream/sugar on the morning of)  SCHEDULE "Lab Only" visit in the morning at the clinic for lab draw in 8 MONTHS   - Make sure Lab Only appointment is at about 1 week before your next appointment, so that results will be available  For Lab Results, once available within 2-3 days of blood draw, you can can log in to MyChart online to view your results and a brief explanation. Also, we can discuss results at next follow-up visit.   Please schedule a Follow-up Appointment to: Return in about 8 months (around 09/26/2022) for 8 fasting lab only then 1 week later Annual Physical.  If you have any other questions or concerns, please feel free to call the office or send a message through [place]. You may also schedule an earlier appointment if necessary.  Additionally, you may be receiving a survey about your experience at our office within a few days to 1 week by e-mail or mail. We value your feedback.  Nobie Putnam, DO [place]

## 2022-01-25 NOTE — Assessment & Plan Note (Signed)
Well controlled cholesterol on statin  Last lipid panel 09/2020 Known CAD CABG  Plan: 1. Continue current meds - Atorvastatin '80mg'$  daily, Fish Oil omega 3 2. Continue ASA '81mg'$  for secondary ASCVD risk reduction 3. Encourage improved lifestyle - low carb/cholesterol, reduce portion size, continue improving regular exercise

## 2022-01-25 NOTE — Assessment & Plan Note (Signed)
Improved X0X to 7.0 Complications - CKD-II, hyperlipidemia, CAD, obesity - increases risk of future cardiovascular complications   Plan:  1. Cotninue Metformin to 1000 BID - Future consider injectable GLP1 meds, due to CKD limit other options 2. Encourage improved lifestyle - low carb, low sugar diet, reduce portion size, continue improving and start more regular exercise 3. Check CBG, bring log to next visit for review 4. Continue ASA, ACEi, Statin

## 2022-02-12 ENCOUNTER — Other Ambulatory Visit: Payer: Self-pay | Admitting: Nurse Practitioner

## 2022-02-12 DIAGNOSIS — I10 Essential (primary) hypertension: Secondary | ICD-10-CM

## 2022-02-12 DIAGNOSIS — I480 Paroxysmal atrial fibrillation: Secondary | ICD-10-CM

## 2022-02-19 ENCOUNTER — Other Ambulatory Visit: Payer: Self-pay | Admitting: Internal Medicine

## 2022-05-25 ENCOUNTER — Other Ambulatory Visit: Payer: Self-pay | Admitting: Internal Medicine

## 2022-06-01 ENCOUNTER — Telehealth: Payer: Self-pay | Admitting: Family Medicine

## 2022-06-01 NOTE — Telephone Encounter (Signed)
Left message for patient to call back and schedule Medicare Annual Wellness Visit (AWV) to be done virtually or by telephone.  No hx of AWV eligible as of 10/12/17  Please schedule at anytime with [facility].      35 Minutes appointment   Any questions, please call me at [phone]

## 2022-06-02 NOTE — Telephone Encounter (Signed)
Pts spouse returned call and declined to schedule awv

## 2022-07-03 ENCOUNTER — Other Ambulatory Visit: Payer: Self-pay | Admitting: Internal Medicine

## 2022-07-03 DIAGNOSIS — I4819 Other persistent atrial fibrillation: Secondary | ICD-10-CM

## 2022-07-05 NOTE — Telephone Encounter (Signed)
Refill request

## 2022-07-05 NOTE — Telephone Encounter (Addendum)
Prescription refill request for Eliquis received. Indication: AF Last office visit: 08/05/21  Grayce Sessions MD Scr: 1.27 on 09/15/21 Age: 72 Weight: 108kg Has appt 08/05/22 with C End MD  Based on above findings Eliquis '5mg'$  twice daily is the appropriate dose.  Refill approved.

## 2022-08-03 ENCOUNTER — Encounter: Payer: Self-pay | Admitting: Internal Medicine

## 2022-08-03 NOTE — Progress Notes (Unsigned)
Follow-up Outpatient Visit Date: 08/05/2022  Primary Care Provider: Olin Hauser, DO [address] [address]  Chief Complaint: Follow-up coronary artery disease and atrial fibrillation  HPI:  Mr. Bakeman is a 72 y.o. male with history of coronary artery disease status post CABG (2013), permanent atrial fibrillation, hypertension, hyperlipidemia, type 2 diabetes mellitus, who presents for follow-up of coronary artery disease and atrial fibrillation.  He was last seen in our office a year ago by Ignacia Bayley, NP, at which time he was doing well from a heart standpoint.  He complained primarily of bilateral hip pain.  He was noted to be back in atrial fibrillation, prompting referrals to EP and pulmonology (sleep disordered breathing with suspicion for OSA).  Dr. Quentin Ore recommended continuation of rate-control strategy.  Today, Mr. Veras reports that he feels the best that he has felt in a long time.  He is still bothered by intermittent back and hip pain.  It has been easing off recently with ibuprofen.  He is trying to minimize his ibuprofen use.  He still has intermittent dyspnea that seems to coincide with seasonal allergies this time of year.  He denies chest pain and palpitations.  He has occasional brief orthostatic lightheadedness but has not passed out or fallen.  Remains compliant with his medications, including apixaban.  --------------------------------------------------------------------------------------------------  Past Medical History:  Diagnosis Date   Allergy    Coronary artery disease 2013   a. [place] 2013 w/ severe 3-V CAD; b. s/p 4-V CABG in 2013 (LIMA-LAD, VG-D2, seq VG-PLA/PDA, VG-OM; c. LHC 2015 patent LIMA-LAD, occluded VGs, med Rx   Diastolic dysfunction    a. 07/2017 Echo: eF 50-55%, mild LVH. no rwma, Gr2 DD. Mildly dil LA/RA. Nl RV size/fxn.   Essential hypertension    Headache(784.0)    Hyperlipidemia LDL goal <70    Permanent atrial  fibrillation (HCC)    Type II diabetes mellitus ([place])    Past Surgical History:  Procedure Laterality Date   CARDIAC CATHETERIZATION  12/18/13   Occluded SVGs   CARDIOVERSION N/A 07/22/2020   Procedure: CARDIOVERSION;  Surgeon: Nelva Bush, MD;  Location: [facility] ORS;  Service: Cardiovascular;  Laterality: N/A;   CORONARY ARTERY BYPASS GRAFT  04/28/2012   Procedure: CORONARY ARTERY BYPASS GRAFTING (CABG);  Surgeon: Ivin Poot, MD;  Location: [facility];  Service: Open Heart Surgery;  Laterality: N/A;  Right saphenous vein used for bypass grafting. Five  bypasses performed including left mammary artery.    INGUINAL HERNIA REPAIR     right    LEFT HEART CATHETERIZATION WITH CORONARY ANGIOGRAM N/A 04/27/2012   Procedure: LEFT HEART CATHETERIZATION WITH CORONARY ANGIOGRAM;  Surgeon: Larey Dresser, MD;  Location: [facility] CATH LAB;  Service: Cardiovascular;  Laterality: N/A;   LEFT HEART CATHETERIZATION WITH CORONARY/GRAFT ANGIOGRAM N/A 12/18/2013   Procedure: LEFT HEART CATHETERIZATION WITH Beatrix Fetters;  Surgeon: Sinclair Grooms, MD;  Location: [facility] CATH LAB;  Service: Cardiovascular;  Laterality: N/A;    Current Meds  Medication Sig   acetaminophen (TYLENOL) 325 MG tablet Take 650 mg by mouth every 6 (six) hours as needed for headache.   atorvastatin (LIPITOR) 80 MG tablet TAKE 1 TABLET BY MOUTH AT 6PM AS DIRECTED   Coenzyme Q10 (CO Q 10) 100 MG CAPS Take 200 mg by mouth daily.   ELIQUIS 5 MG TABS tablet TAKE 1 TABLET BY MOUTH TWICE A DAY   fexofenadine (ALLEGRA) 180 MG tablet Take 180 mg by mouth daily.  fish oil-omega-3 fatty acids 1000 MG capsule Take 1 g by mouth daily.   isosorbide mononitrate (IMDUR) 30 MG 24 hr tablet Take 1 tablet (30 mg total) by mouth daily.   lisinopril (ZESTRIL) 10 MG tablet TAKE 1 TABLET BY MOUTH EVERY DAY   metFORMIN (GLUCOPHAGE) 500 MG tablet Take 2 tablets (1,000 mg total) by mouth 2 (two) times daily with a meal.   metoprolol tartrate (LOPRESSOR) 50  MG tablet Take 0.5 tablets (25 mg total) by mouth 2 (two) times daily.   nitroGLYCERIN (NITROSTAT) 0.4 MG SL tablet Place 1 tablet (0.4 mg total) under the tongue every 5 (five) minutes as needed for chest pain.    Allergies: Penicillins  Social History   Tobacco Use   Smoking status: Never   Smokeless tobacco: Never  Vaping Use   Vaping Use: Never used  Substance Use Topics   Alcohol use: Not Currently   Drug use: No    Family History  Problem Relation Age of Onset   CAD Mother    Heart attack Mother    CAD Father    Diabetes Father    Heart attack Father    Colon cancer Neg Hx    Prostate cancer Neg Hx     Review of Systems: A 12-system review of systems was performed and was negative except as noted in the HPI.  --------------------------------------------------------------------------------------------------  Physical Exam: BP (!) 140/88 (BP Location: Left Arm, Patient Position: Sitting, Cuff Size: Large)   Pulse 78   Ht 6' 4"$  (1.93 m)   Wt 242 lb (109.8 kg)   SpO2 98%   BMI 29.46 kg/m  Repeat BP: 128/80  General:  NAD. Neck: No JVD or HJR. Lungs: Clear to auscultation bilaterally without wheezes or crackles. Heart: Irregularly irregular rhythm without murmurs. Abdomen: Soft, nontender, nondistended. Extremities: No lower extremity edema.  EKG: Atrial fibrillation with left anterior fascicular block, borderline LVH, poor R wave progression, and lateral ST/T changes.  Lateral ST/T changes are more pronounced compared to most recent prior tracing on 08/04/2021 but are similar to the EKG from 05/01/2021.  Lab Results  Component Value Date   WBC 8.5 09/15/2021   HGB 14.7 09/15/2021   HCT 45.0 09/15/2021   MCV 90.9 09/15/2021   PLT 230 09/15/2021    Lab Results  Component Value Date   NA 139 09/15/2021   K 4.6 09/15/2021   CL 102 09/15/2021   CO2 28 09/15/2021   BUN 19 09/15/2021   CREATININE 1.19 09/15/2021   GLUCOSE 178 (H) 09/15/2021   ALT 23  09/15/2021    Lab Results  Component Value Date   CHOL 181 09/15/2021   HDL 43 09/15/2021   LDLCALC 112 (H) 09/15/2021   TRIG 138 09/15/2021   CHOLHDL 4.2 09/15/2021    --------------------------------------------------------------------------------------------------  ASSESSMENT AND PLAN: Coronary artery disease with stable angina: No angina reported.  Chronic exertional dyspnea this likely multifactorial is unchanged from baseline.  EKG today shows more pronounced lateral ST/T changes compared to 07/2021.  However, EKG is similar to 05/01/2021 in regard to ST/T changes.  Given lack of ongoing symptoms and reassuring echocardiogram last year, we will defer additional testing at this time.  Continue antianginal therapy consisting of isosorbide mononitrate and metoprolol tartrate.  Defer aspirin given long-term anticoagulation with apixaban.  Permanent atrial fibrillation: Rate control remains adequate.  Continue current regimen of metoprolol tartrate and apixaban.  Hypertension: Blood pressure initially mildly elevated, improved and borderline on recheck in the office today.  I have encouraged Mr. Martineau to monitor his blood pressure regularly at home and to alert [place] if it is consistently above 130/80.  Continue current regimen of isosorbide mononitrate, lisinopril, and metoprolol.  Hyperlipidemia associated with type 2 diabetes mellitus: Most recent lipid panel in 09/2021 was notable for LDL of 112, which is above our goal of 70.  Mr. Laplume is scheduled for repeat labs in early April with Dr. Parks Ranger.  We will continue atorvastatin 80 mg daily for the time being, though if LDL remains above goal, transition to rosuvastatin 40 mg daily or addition of ezetimibe 10 mg daily should be considered.  Follow-up: Return to clinic in 1 year.  Nelva Bush, MD 08/05/2022 8:06 AM

## 2022-08-05 ENCOUNTER — Ambulatory Visit: Payer: Medicare HMO | Attending: Internal Medicine | Admitting: Internal Medicine

## 2022-08-05 ENCOUNTER — Encounter: Payer: Self-pay | Admitting: Internal Medicine

## 2022-08-05 VITALS — BP 128/80 | HR 78 | Ht 76.0 in | Wt 242.0 lb

## 2022-08-05 DIAGNOSIS — E785 Hyperlipidemia, unspecified: Secondary | ICD-10-CM

## 2022-08-05 DIAGNOSIS — I4821 Permanent atrial fibrillation: Secondary | ICD-10-CM

## 2022-08-05 DIAGNOSIS — I25118 Atherosclerotic heart disease of native coronary artery with other forms of angina pectoris: Secondary | ICD-10-CM

## 2022-08-05 DIAGNOSIS — E1169 Type 2 diabetes mellitus with other specified complication: Secondary | ICD-10-CM | POA: Diagnosis not present

## 2022-08-05 DIAGNOSIS — I10 Essential (primary) hypertension: Secondary | ICD-10-CM

## 2022-08-05 MED ORDER — METOPROLOL TARTRATE 25 MG PO TABS: 25.0000 mg | ORAL_TABLET | Freq: Two times a day (BID) | ORAL | 3 refills | Status: DC

## 2022-08-05 MED ORDER — ISOSORBIDE MONONITRATE ER 30 MG PO TB24: 30.0000 mg | ORAL_TABLET | Freq: Every day | ORAL | 3 refills | Status: DC

## 2022-08-05 MED ORDER — ATORVASTATIN CALCIUM 80 MG PO TABS: 80.0000 mg | ORAL_TABLET | Freq: Every day | ORAL | 3 refills | Status: DC

## 2022-08-05 MED ORDER — LISINOPRIL 10 MG PO TABS: 10.0000 mg | ORAL_TABLET | Freq: Every day | ORAL | 3 refills | Status: DC

## 2022-08-05 NOTE — Patient Instructions (Signed)
Medication Instructions:  Your Physician recommend you continue on your current medication as directed.    *If you need a refill on your cardiac medications before your next appointment, please call your pharmacy*   Lab Work: None ordered today   Testing/Procedures: None ordered today   Follow-Up: At [facility], you and your health needs are our priority.  As part of our continuing mission to provide you with exceptional heart care, we have created designated Provider Care Teams.  These Care Teams include your primary Cardiologist (physician) and Advanced Practice Providers (APPs -  Physician Assistants and Nurse Practitioners) who all work together to provide you with the care you need, when you need it.  We recommend signing up for the patient portal called "MyChart".  Sign up information is provided on this After Visit Summary.  MyChart is used to connect with patients for Virtual Visits (Telemedicine).  Patients are able to view lab/test results, encounter notes, upcoming appointments, etc.  Non-urgent messages can be sent to your provider as well.   To learn more about what you can do with MyChart, go to [email or web address].    Your next appointment:   1 year(s)  Provider:   You may see Nelva Bush, MD or one of the following Advanced Practice Providers on your designated Care Team:   Murray Hodgkins, NP Christell Faith, PA-C Cadence Kathlen Mody, PA-C Gerrie Nordmann, NP    Dr. Saunders Revel recommends checking and keeping a log of your blood pressure throughout the week. Please call the office if you notice your blood pressure is consistently greater than 130/80.  It is best to check your blood pressure 1-2 hours after taking your medications.  Below are some tips that our clinical pharmacists share for home BP monitoring:          Rest 10 minutes before taking your blood pressure.          Don't smoke or drink caffeinated beverages for at least 30 minutes before.           Take your blood pressure before (not after) you eat.          Sit comfortably with your back supported and both feet on the floor (don't cross your legs).          Elevate your arm to heart level on a table or a desk.          Use the proper sized cuff. It should fit smoothly and snugly around your bare upper arm. There should be enough room to slip a fingertip under the cuff. The bottom edge of the cuff should be 1 inch above the crease of the elbow.

## 2022-09-17 ENCOUNTER — Other Ambulatory Visit: Payer: Self-pay

## 2022-09-17 DIAGNOSIS — Z0000 Encounter for general adult medical examination without abnormal findings: Secondary | ICD-10-CM

## 2022-09-17 DIAGNOSIS — I25118 Atherosclerotic heart disease of native coronary artery with other forms of angina pectoris: Secondary | ICD-10-CM

## 2022-09-17 DIAGNOSIS — E1121 Type 2 diabetes mellitus with diabetic nephropathy: Secondary | ICD-10-CM

## 2022-09-17 DIAGNOSIS — R351 Nocturia: Secondary | ICD-10-CM

## 2022-09-17 DIAGNOSIS — I129 Hypertensive chronic kidney disease with stage 1 through stage 4 chronic kidney disease, or unspecified chronic kidney disease: Secondary | ICD-10-CM

## 2022-09-17 DIAGNOSIS — I4819 Other persistent atrial fibrillation: Secondary | ICD-10-CM

## 2022-09-17 DIAGNOSIS — E1169 Type 2 diabetes mellitus with other specified complication: Secondary | ICD-10-CM

## 2022-09-20 ENCOUNTER — Other Ambulatory Visit: Payer: Medicare HMO

## 2022-09-20 DIAGNOSIS — I4819 Other persistent atrial fibrillation: Secondary | ICD-10-CM | POA: Diagnosis not present

## 2022-09-20 DIAGNOSIS — E785 Hyperlipidemia, unspecified: Secondary | ICD-10-CM | POA: Diagnosis not present

## 2022-09-20 DIAGNOSIS — I129 Hypertensive chronic kidney disease with stage 1 through stage 4 chronic kidney disease, or unspecified chronic kidney disease: Secondary | ICD-10-CM | POA: Diagnosis not present

## 2022-09-20 DIAGNOSIS — I25118 Atherosclerotic heart disease of native coronary artery with other forms of angina pectoris: Secondary | ICD-10-CM | POA: Diagnosis not present

## 2022-09-20 DIAGNOSIS — Z0000 Encounter for general adult medical examination without abnormal findings: Secondary | ICD-10-CM | POA: Diagnosis not present

## 2022-09-20 DIAGNOSIS — E1121 Type 2 diabetes mellitus with diabetic nephropathy: Secondary | ICD-10-CM | POA: Diagnosis not present

## 2022-09-20 DIAGNOSIS — E1169 Type 2 diabetes mellitus with other specified complication: Secondary | ICD-10-CM | POA: Diagnosis not present

## 2022-09-20 DIAGNOSIS — R351 Nocturia: Secondary | ICD-10-CM | POA: Diagnosis not present

## 2022-09-20 DIAGNOSIS — N182 Chronic kidney disease, stage 2 (mild): Secondary | ICD-10-CM | POA: Diagnosis not present

## 2022-09-21 LAB — LIPID PANEL
Cholesterol: 97 mg/dL (ref <200)
HDL: 40 mg/dL (ref >=40)
LDL Cholesterol (Calc): 43 mg/dL (calc)
Non-HDL Cholesterol (Calc): 57 mg/dL (calc) (ref <130)
Total CHOL/HDL Ratio: 2.4 (calc) (ref <5.0)
Triglycerides: 65 mg/dL (ref <150)

## 2022-09-21 LAB — CBC WITH DIFFERENTIAL/PLATELET
Absolute Monocytes: 540 cells/uL (ref 200–950)
Basophils Absolute: 50 cells/uL (ref 0–200)
Basophils Relative: 0.7 %
Eosinophils Absolute: 142 cells/uL (ref 15–500)
Eosinophils Relative: 2 %
HCT: 42.5 % (ref 38.5–50.0)
Hemoglobin: 13.8 g/dL (ref 13.2–17.1)
Lymphs Abs: 2123 cells/uL (ref 850–3900)
MCH: 29.3 pg (ref 27.0–33.0)
MCHC: 32.5 g/dL (ref 32.0–36.0)
MCV: 90.2 fL (ref 80.0–100.0)
MPV: 11.8 fL (ref 7.5–12.5)
Monocytes Relative: 7.6 %
Neutro Abs: 4246 cells/uL (ref 1500–7800)
Neutrophils Relative %: 59.8 %
Platelets: 198 10*3/uL (ref 140–400)
RBC: 4.71 10*6/uL (ref 4.20–5.80)
RDW: 12.9 % (ref 11.0–15.0)
Total Lymphocyte: 29.9 %
WBC: 7.1 10*3/uL (ref 3.8–10.8)

## 2022-09-21 LAB — COMPLETE METABOLIC PANEL WITH GFR
AG Ratio: 1.7 (calc) (ref 1.0–2.5)
ALT: 28 U/L (ref 9–46)
AST: 25 U/L (ref 10–35)
Albumin: 4.4 g/dL (ref 3.6–5.1)
Alkaline phosphatase (APISO): 77 U/L (ref 35–144)
BUN: 20 mg/dL (ref 7–25)
CO2: 30 mmol/L (ref 20–32)
Calcium: 9.7 mg/dL (ref 8.6–10.3)
Chloride: 104 mmol/L (ref 98–110)
Creat: 1.19 mg/dL (ref 0.70–1.28)
Globulin: 2.6 g/dL (calc) (ref 1.9–3.7)
Glucose, Bld: 155 mg/dL — ABNORMAL HIGH (ref 65–99)
Potassium: 4.6 mmol/L (ref 3.5–5.3)
Sodium: 141 mmol/L (ref 135–146)
Total Bilirubin: 0.7 mg/dL (ref 0.2–1.2)
Total Protein: 7 g/dL (ref 6.1–8.1)
eGFR: 66 mL/min/{1.73_m2} (ref >=60)

## 2022-09-21 LAB — HEMOGLOBIN A1C
Hgb A1c MFr Bld: 8.5 % of total Hgb — ABNORMAL HIGH (ref <5.7)
Mean Plasma Glucose: 197 mg/dL
eAG (mmol/L): 10.9 mmol/L

## 2022-09-21 LAB — TSH: TSH: 4.03 mIU/L (ref 0.40–4.50)

## 2022-09-21 LAB — PSA: PSA: 1.92 ng/mL (ref <=4.00)

## 2022-09-27 ENCOUNTER — Ambulatory Visit (INDEPENDENT_AMBULATORY_CARE_PROVIDER_SITE_OTHER): Payer: Medicare HMO | Admitting: Family Medicine

## 2022-09-27 ENCOUNTER — Other Ambulatory Visit: Payer: Self-pay | Admitting: Family Medicine

## 2022-09-27 ENCOUNTER — Encounter: Payer: Self-pay | Admitting: Family Medicine

## 2022-09-27 VITALS — BP 136/87 | HR 86 | Ht 76.0 in | Wt 240.0 lb

## 2022-09-27 DIAGNOSIS — N182 Chronic kidney disease, stage 2 (mild): Secondary | ICD-10-CM

## 2022-09-27 DIAGNOSIS — E1169 Type 2 diabetes mellitus with other specified complication: Secondary | ICD-10-CM | POA: Diagnosis not present

## 2022-09-27 DIAGNOSIS — E1121 Type 2 diabetes mellitus with diabetic nephropathy: Secondary | ICD-10-CM

## 2022-09-27 DIAGNOSIS — Z1211 Encounter for screening for malignant neoplasm of colon: Secondary | ICD-10-CM | POA: Diagnosis not present

## 2022-09-27 DIAGNOSIS — I25118 Atherosclerotic heart disease of native coronary artery with other forms of angina pectoris: Secondary | ICD-10-CM | POA: Diagnosis not present

## 2022-09-27 DIAGNOSIS — I129 Hypertensive chronic kidney disease with stage 1 through stage 4 chronic kidney disease, or unspecified chronic kidney disease: Secondary | ICD-10-CM | POA: Diagnosis not present

## 2022-09-27 DIAGNOSIS — E785 Hyperlipidemia, unspecified: Secondary | ICD-10-CM

## 2022-09-27 DIAGNOSIS — Z0000 Encounter for general adult medical examination without abnormal findings: Secondary | ICD-10-CM

## 2022-09-27 MED ORDER — METFORMIN HCL 500 MG PO TABS: 1000.0000 mg | ORAL_TABLET | Freq: Two times a day (BID) | ORAL | 3 refills | Status: DC

## 2022-09-27 NOTE — Assessment & Plan Note (Signed)
Controlled HTN Complicated by CAD, CKD-II   Plan:  1. Continue current BP regimen - Metoprolol 25mg BID, Lisiniopril 10mg daily, Isosorbide mononite 2. Encourage improved lifestyle - low sodium diet, regular exercise 3. Continue monitor BP outside office, bring readings to next visit, if persistently >140/90 or new symptoms notify office sooner 

## 2022-09-27 NOTE — Progress Notes (Signed)
Subjective:    Patient ID: [name] [name], male    DOB: [birth year], 72 y.o.   MRN: [id number]  [name] [name] is a 72 y.o. male presenting on 09/27/2022 for Annual Exam   HPI  Here for Annual Physical and Lab Review  Type 2 Diabetes, with Nephropathy CKD III Overweight BMI >29 See prior notes for background information. - Today doing well. A1c up to 8.5% now, prior range 7 to 8.5 Weight up 10 lbs in 6 months Attributes to less activity during winter Last lab shows Creatinine 1.19 stable from prior CBGs: No CBGs Meds: Metformin  BID - tolerating well without side effects Currently on ACEi Lifestyle: - Diet (improving diabetic diet, add green tea, adding more water to daily diet,  - Exercise (improved exercise outdoors, yard work he is tolerating exertion even better, stationary bike) - Father with diabetes Recently updated DM Eye Exam. Dr Clydene Pugh 11/06/21 Denies hypoglycemia, visual changes, numbness or tingling, polyuria    CHRONIC HTN: Reports no new concerns Follows with Cardiology He still has chronic bradycardia. Current Meds - Lisinopril  daily, Imdur  daily, Metoprolol  BID Reports good compliance, took meds today. Tolerating well, w/o complaints. Denies CP, dyspnea, HA, edema, dizziness / lightheadedness   CAD s/p CABG 2013 LHC with severe 3 vessel disease Persistent Atrial Fibrillation Followed by [facility] Recent updates in 07/2020 he had cardioversion for paroxysmal atrial fibrillation, very successful procedure, he has maintained sinus rhythm since Continues on anticoagulation Continues rate control - Taking Eliquis  BID, high intensity statin, ASA, omega 3 fish oil, imdur, ACEi, BB - has NTG SL. Not using it. Off Aspirin     HYPERLIPIDEMIA: - Reports no concerns. Last lipid panel 09/2022, controlled - Currently taking Atorvastatin  daily, tolerating well without side effects or myalgias   Osteoarthritis multiple  joints Reports has episodic days of stiffness and soreness hips and other joints. Taking OTC for it with relief. Right groin pain, worse with laying down. Improved with walking. Feels like it is his hip. Does not feel like hernia Not interested in X-ray Tried Tylenol AS NEEDED  Constipated History of Hernia Admits straining in bathroom could increase pressure on this area of prior hernia repair with Mesh L side He uses stool softeners OTC     Health Maintenance:   Future we can consider Cologuard vs Colonoscopy [facility] 05/2022     09/27/2022    8:59 AM 01/25/2022    9:50 AM 09/22/2021    9:19 AM  Depression screen PHQ 2/9  Decreased Interest 0 0 0  Down, Depressed, Hopeless 0 0 0  PHQ - 2 Score 0 0 0  Altered sleeping 0 0 0  Tired, decreased energy 0 0 0  Change in appetite 0 0 0  Feeling bad or failure about yourself  0 0 0  Trouble concentrating 0 0 0  Moving slowly or fidgety/restless 0 0 0  Suicidal thoughts 0 0 0  PHQ-9 Score 0 0 0  Difficult doing work/chores  Not difficult at all Not difficult at all    Past Medical History:  Diagnosis Date   Allergy    Coronary artery disease 2013   a. LHC 2013 w/ severe 3-V CAD; b. s/p 4-V CABG in 2013 (LIMA-LAD, VG-D2, seq VG-PLA/PDA, VG-OM; c. LHC 2015 patent LIMA-LAD, occluded VGs, med Rx   Diastolic dysfunction    a. 07/2017 Echo: eF 50-55%, mild LVH. no rwma, Gr2 DD. Mildly dil LA/RA. Nl RV size/fxn.  Essential hypertension    Headache(784.0)    Hyperlipidemia LDL goal <70    Permanent atrial fibrillation    Type II diabetes mellitus    Past Surgical History:  Procedure Laterality Date   CARDIAC CATHETERIZATION  12/18/13   Occluded SVGs   CARDIOVERSION N/A 07/22/2020   Procedure: CARDIOVERSION;  Surgeon: [name], MD;  Location: [facility] ORS;  Service: Cardiovascular;  Laterality: N/A;   CORONARY ARTERY BYPASS GRAFT  04/28/2012   Procedure: CORONARY ARTERY BYPASS GRAFTING (CABG);  Surgeon: [name], MD;   Location: [facility] OR;  Service: Open Heart Surgery;  Laterality: N/A;  Right saphenous vein used for bypass grafting. Five  bypasses performed including left mammary artery.    INGUINAL HERNIA REPAIR     right    LEFT HEART CATHETERIZATION WITH CORONARY ANGIOGRAM N/A 04/27/2012   Procedure: LEFT HEART CATHETERIZATION WITH CORONARY ANGIOGRAM;  Surgeon: [name], MD;  Location: [facility] CATH LAB;  Service: Cardiovascular;  Laterality: N/A;   LEFT HEART CATHETERIZATION WITH CORONARY/GRAFT ANGIOGRAM N/A 12/18/2013   Procedure: LEFT HEART CATHETERIZATION WITH Isabel Caprice;  Surgeon: [name], MD;  Location: [facility] CATH LAB;  Service: Cardiovascular;  Laterality: N/A;   Social History   Socioeconomic History   Marital status: Married    Spouse name: Not on file   Number of children: Not on file   Years of education: Not on file   Highest education level: Not on file  Occupational History   Not on file  Tobacco Use   Smoking status: Never   Smokeless tobacco: Never  Vaping Use   Vaping Use: Never used  Substance and Sexual Activity   Alcohol use: Not Currently   Drug use: No   Sexual activity: Yes  Other Topics Concern   Not on file  Social History Narrative   Not on file   Social Determinants of Health   Financial Resource Strain: Not on file  Food Insecurity: Not on file  Transportation Needs: Not on file  Physical Activity: Not on file  Stress: Not on file  Social Connections: Not on file  Intimate Partner Violence: Not on file   Family History  Problem Relation Age of Onset   CAD Mother    Heart attack Mother    CAD Father    Diabetes Father    Heart attack Father    Colon cancer Neg Hx    Prostate cancer Neg Hx    Current Outpatient Medications on File Prior to Visit  Medication Sig   acetaminophen (TYLENOL) 325 MG tablet Take 650 mg by mouth every 6 (six) hours as needed for headache.   atorvastatin (LIPITOR) 80 MG tablet Take 1 tablet (80 mg total) by  mouth daily.   Coenzyme Q10 (CO Q 10) 100 MG CAPS Take 200 mg by mouth daily.   ELIQUIS 5 MG TABS tablet TAKE 1 TABLET BY MOUTH TWICE A DAY   fexofenadine (ALLEGRA) 180 MG tablet Take 180 mg by mouth daily.   fish oil-omega-3 fatty acids 1000 MG capsule Take 1 g by mouth daily.   isosorbide mononitrate (IMDUR) 30 MG 24 hr tablet Take 1 tablet (30 mg total) by mouth daily.   lisinopril (ZESTRIL) 10 MG tablet Take 1 tablet (10 mg total) by mouth daily.   metoprolol tartrate (LOPRESSOR) 25 MG tablet Take 1 tablet (25 mg total) by mouth 2 (two) times daily.   nitroGLYCERIN (NITROSTAT) 0.4 MG SL tablet Place 1 tablet (0.4 mg total) under  the tongue every 5 (five) minutes as needed for chest pain.   No current facility-administered medications on file prior to visit.    Review of Systems  Constitutional:  Negative for activity change, appetite change, chills, diaphoresis, fatigue and fever.  HENT:  Negative for congestion and hearing loss.   Eyes:  Negative for visual disturbance.  Respiratory:  Negative for cough, chest tightness, shortness of breath and wheezing.   Cardiovascular:  Negative for chest pain, palpitations and leg swelling.  Gastrointestinal:  Positive for constipation. Negative for abdominal pain, diarrhea, nausea and vomiting.  Genitourinary:  Negative for dysuria, frequency and hematuria.  Musculoskeletal:  Positive for arthralgias. Negative for neck pain.       R groin pain  Skin:  Negative for rash.  Neurological:  Negative for dizziness, weakness, light-headedness, numbness and headaches.  Hematological:  Negative for adenopathy.  Psychiatric/Behavioral:  Negative for behavioral problems, dysphoric mood and sleep disturbance.    Per HPI unless specifically indicated above      Objective:    BP 136/87   Pulse 86   Ht  (1.93 m)   Wt 240 lb (108.9 kg)   BMI 29.21 kg/m   Wt Readings from Last 3 Encounters:  09/27/22 240 lb (108.9 kg)  08/05/22 242 lb (109.8  kg)  01/25/22 230 lb 3.2 oz (104.4 kg)    Physical Exam Vitals and nursing note reviewed.  Constitutional:      General: He is not in acute distress.    Appearance: He is well-developed. He is not diaphoretic.     Comments: Well-appearing, comfortable, cooperative  HENT:     Head: Normocephalic and atraumatic.  Eyes:     General:        Right eye: No discharge.        Left eye: No discharge.     Conjunctiva/sclera: Conjunctivae normal.     Pupils: Pupils are equal, round, and reactive to light.  Neck:     Thyroid: No thyromegaly.     Vascular: No carotid bruit.  Cardiovascular:     Rate and Rhythm: Normal rate and regular rhythm.     Pulses: Normal pulses.     Heart sounds: Normal heart sounds. No murmur heard. Pulmonary:     Effort: Pulmonary effort is normal. No respiratory distress.     Breath sounds: Normal breath sounds. No wheezing or rales.  Abdominal:     General: Bowel sounds are normal. There is no distension.     Palpations: Abdomen is soft. There is no mass.     Tenderness: There is no abdominal tenderness.     Hernia: No hernia (No R inguinal hernia provoked on exam.) is present.  Musculoskeletal:        General: No tenderness. Normal range of motion.     Cervical back: Normal range of motion and neck supple.     Right lower leg: No edema.     Left lower leg: No edema.     Comments: Upper / Lower Extremities: - Normal muscle tone, strength bilateral upper extremities 5/5, lower extremities 5/5  Lymphadenopathy:     Cervical: No cervical adenopathy.  Skin:    General: Skin is warm and dry.     Findings: No erythema or rash.  Neurological:     Mental Status: He is alert and oriented to person, place, and time.     Comments: Distal sensation intact to light touch all extremities  Psychiatric:  Mood and Affect: Mood normal.        Behavior: Behavior normal.        Thought Content: Thought content normal.     Comments: Well groomed, good eye contact,  normal speech and thoughts      Diabetic Foot Exam - Simple   Simple Foot Form Diabetic Foot exam was performed with the following findings: Yes 09/27/2022  9:21 AM  Visual Inspection No deformities, no ulcerations, no other skin breakdown bilaterally: Yes Sensation Testing Intact to touch and monofilament testing bilaterally: Yes Pulse Check Posterior Tibialis and Dorsalis pulse intact bilaterally: Yes Comments      Results for orders placed or performed in visit on 09/17/22  TSH  Result Value Ref Range   TSH 4.03 0.40 - 4.50 mIU/L  PSA  Result Value Ref Range   PSA 1.92 < OR = 4.00 ng/mL  Hemoglobin A1c  Result Value Ref Range   Hgb A1c MFr Bld 8.5 (H) <5.7 % of total Hgb   Mean Plasma Glucose 197 mg/dL   eAG (mmol/L) 78.2 mmol/L  Lipid panel  Result Value Ref Range   Cholesterol 97 <200 mg/dL   HDL 40 > OR = 40 mg/dL   Triglycerides 65 <956 mg/dL   LDL Cholesterol (Calc) 43 mg/dL (calc)   Total CHOL/HDL Ratio 2.4 <5.0 (calc)   Non-HDL Cholesterol (Calc) 57 <213 mg/dL (calc)  CBC with Differential/Platelet  Result Value Ref Range   WBC 7.1 3.8 - 10.8 Thousand/uL   RBC 4.71 4.20 - 5.80 Million/uL   Hemoglobin 13.8 13.2 - 17.1 g/dL   HCT 08.6 57.8 - 46.9 %   MCV 90.2 80.0 - 100.0 fL   MCH 29.3 27.0 - 33.0 pg   MCHC 32.5 32.0 - 36.0 g/dL   RDW 62.9 52.8 - 41.3 %   Platelets 198 140 - 400 Thousand/uL   MPV 11.8 7.5 - 12.5 fL   Neutro Abs 4,246 1,500 - 7,800 cells/uL   Lymphs Abs 2,123 850 - 3,900 cells/uL   Absolute Monocytes 540 200 - 950 cells/uL   Eosinophils Absolute 142 15 - 500 cells/uL   Basophils Absolute 50 0 - 200 cells/uL   Neutrophils Relative % 59.8 %   Total Lymphocyte 29.9 %   Monocytes Relative 7.6 %   Eosinophils Relative 2.0 %   Basophils Relative 0.7 %  COMPLETE METABOLIC PANEL WITH GFR  Result Value Ref Range   Glucose, Bld 155 (H) 65 - 99 mg/dL   BUN 20 7 - 25 mg/dL   Creat 2.44 0.10 - 2.72 mg/dL   eGFR 66 > OR = 60 ZD/GUY/4.03K7    BUN/Creatinine Ratio SEE NOTE: 6 - 22 (calc)   Sodium 141 135 - 146 mmol/L   Potassium 4.6 3.5 - 5.3 mmol/L   Chloride 104 98 - 110 mmol/L   CO2 30 20 - 32 mmol/L   Calcium 9.7 8.6 - 10.3 mg/dL   Total Protein 7.0 6.1 - 8.1 g/dL   Albumin 4.4 3.6 - 5.1 g/dL   Globulin 2.6 1.9 - 3.7 g/dL (calc)   AG Ratio 1.7 1.0 - 2.5 (calc)   Total Bilirubin 0.7 0.2 - 1.2 mg/dL   Alkaline phosphatase (APISO) 77 35 - 144 U/L   AST 25 10 - 35 U/L   ALT 28 9 - 46 U/L      Assessment & Plan:   Problem List Items Addressed This Visit     Benign hypertension with CKD (chronic kidney disease), stage II  Controlled HTN Complicated by CAD, CKD-II   Plan:  1. Continue current BP regimen - Metoprolol 25mg  BID, Lisiniopril 10mg  daily, Isosorbide mononite 2. Encourage improved lifestyle - low sodium diet, regular exercise 3. Continue monitor BP outside office, bring readings to next visit, if persistently >140/90 or new symptoms notify office sooner      Coronary artery disease of native artery of native heart with stable angina pectoris    Followed by Cardiology on med management      Hyperlipidemia associated with type 2 diabetes mellitus    Well controlled cholesterol on statin  Last lipid panel 09/2022 Known CAD CABG  Plan: 1. Continue current meds - Atorvastatin 80mg  daily, Fish Oil omega 3 2. Continue ASA 81mg  for secondary ASCVD risk reduction 3. Encourage improved lifestyle - low carb/cholesterol, reduce portion size, continue improving regular exercise      Relevant Medications   metFORMIN (GLUCOPHAGE) 500 MG tablet   Type 2 diabetes mellitus with diabetic nephropathy    Elevated A1c 8.5 Complications - CKD-II, hyperlipidemia, CAD, obesity - increases risk of future cardiovascular complications   Plan:  1. Continue Metformin to 1000 BID - Future consider injectable GLP1 meds, due to CKD limit other options - AVS Info again given to patient, asked him to check into options contact  me back we can trial sample or new order  2. Encourage improved lifestyle - low carb, low sugar diet, reduce portion size, continue improving and start more regular exercise 3. Check CBG, bring log to next visit for review 4. Continue ASA, ACEi, Statin      Relevant Medications   metFORMIN (GLUCOPHAGE) 500 MG tablet   Other Visit Diagnoses     Annual physical exam    -  Primary   Screening for colon cancer       Relevant Orders   Ambulatory referral to Gastroenterology       Updated Health Maintenance information Reviewed recent lab results with patient Encouraged improvement to lifestyle with diet and exercise Goal of weight loss  Additional  R Groin Pain - seems most likely OA/DJD from R hip radiation of pain. No evidence of hernia on exam. Prior L inguinal hernia repair in the past Advise Miralax for constipation   Referral to [facility] in [place], if not heard back within 1-2 weeks, you can call them to schedule.  Orders Placed This Encounter  Procedures   Ambulatory referral to Gastroenterology    Referral Priority:   Routine    Referral Type:   Consultation    Referral Reason:   Specialty Services Required    Number of Visits Requested:   1     Meds ordered this encounter  Medications   metFORMIN (GLUCOPHAGE) 500 MG tablet    Sig: Take 2 tablets (1,000 mg total) by mouth 2 (two) times daily with a meal.    Dispense:  360 tablet    Refill:  3      Follow up plan: Return in about 4 months (around 01/27/2023) for 4 month follow-up DM A1c (add Urine Microalbumin).  Saralyn Pilar, DO [facility] 09/27/2022, 9:11 AM

## 2022-09-27 NOTE — Assessment & Plan Note (Signed)
Well controlled cholesterol on statin  Last lipid panel 09/2022 Known CAD CABG  Plan: 1. Continue current meds - Atorvastatin 80mg  daily, Fish Oil omega 3 2. Continue ASA 81mg  for secondary ASCVD risk reduction 3. Encourage improved lifestyle - low carb/cholesterol, reduce portion size, continue improving regular exercise

## 2022-09-27 NOTE — Assessment & Plan Note (Signed)
Elevated A1c 8.5 Complications - CKD-II, hyperlipidemia, CAD, obesity - increases risk of future cardiovascular complications   Plan:  1. Continue Metformin to 1000 BID - Future consider injectable GLP1 meds, due to CKD limit other options - AVS Info again given to patient, asked him to check into options contact me back we can trial sample or new order  2. Encourage improved lifestyle - low carb, low sugar diet, reduce portion size, continue improving and start more regular exercise 3. Check CBG, bring log to next visit for review 4. Continue ASA, ACEi, Statin

## 2022-09-27 NOTE — Assessment & Plan Note (Signed)
Followed by Cardiology on med management

## 2022-09-27 NOTE — Patient Instructions (Addendum)
Thank you for coming to the office today.  Referral to [facility] in [place], if not heard back within 1-2 weeks, you can call them to schedule.  ----------------  Urine test next time.  Call insurance find cost and coverage of the following - check the following: - Drug Tier, Preferred List, On Formulary - All will require a "Prior Authorization" from [place] first, before you can find out the cost - Find out if there is "Step Therapy" (other medicines required before you can try these)  Once you pick the one you want to try, let me know - we can get a sample ready IF we have it in stock. Then try it - and before running out of medicine, contact me back to order your Rx so we have time to get it processed.  For Diabetes  INJECTION   1. Ozempic (Semaglutide injection) - weekly injection   2. Trulicity (Dulaglutide) - once weekly injection  3. Mounjaro (Tirzepatide) once weekly injection  ------ PILLS  4. Rybelsus (pill - oral semaglutide or ozempic) - once daily, taken first thing in morning without other meds  5. Januvia daily pill  6. Jardiance or [place] - daily pill   We can get these newer meds at low cost if you are interested.  3 benefits - 1 significantly reduced A1c sugar, and may be able to reduce or stop metformin in future - 2 reduced appetite and weight loss with good results - 3 cardiovascular risk reduction, less likely to have heart attack/stroke  Likely arthritis of R Hip, we can consider X-ray in future.  -----  For Constipation (less frequent bowel movement that can be hard dry or involve straining).  Recommend trying OTC Miralax 17g = 1 capful in large glass water once daily for now, try several days to see if working, goal is soft stool or BM 1-2 times daily, if too loose then reduce dose or try every other day. If not effective may need to increase it to 2 doses at once in AM or may do 1 in morning and 1 in afternoon/evening  - This medicine is very  safe and can be used often without any problem and will not make you dehydrated. It is good for use on AS NEEDED BASIS or even MAINTENANCE therapy for longer term for several days to weeks at a time to help regulate bowel movements  Other more natural remedies or preventative treatment: - Increase hydration with water - Increase fiber in diet (high fiber foods = vegetables, leafy greens, oats/grains) - May take OTC Fiber supplement (metamucil powder or pill/gummy) - May try OTC Probiotic    Please schedule a Follow-up Appointment to: Return in about 4 months (around 01/27/2023) for 4 month follow-up DM A1c (add Urine Microalbumin).  If you have any other questions or concerns, please feel free to call the office or send a message through MyChart. You may also schedule an earlier appointment if necessary.  Additionally, you may be receiving a survey about your experience at our office within a few days to 1 week by e-mail or mail. We value your feedback.  Saralyn Pilar, DO [facility], [place]

## 2022-11-19 ENCOUNTER — Telehealth: Payer: Self-pay | Admitting: Internal Medicine

## 2022-11-19 DIAGNOSIS — Z7901 Long term (current) use of anticoagulants: Secondary | ICD-10-CM | POA: Diagnosis not present

## 2022-11-19 DIAGNOSIS — I480 Paroxysmal atrial fibrillation: Secondary | ICD-10-CM | POA: Diagnosis not present

## 2022-11-19 DIAGNOSIS — Z86010 Personal history of colonic polyps: Secondary | ICD-10-CM | POA: Diagnosis not present

## 2022-11-19 DIAGNOSIS — K5900 Constipation, unspecified: Secondary | ICD-10-CM | POA: Diagnosis not present

## 2022-11-19 NOTE — Telephone Encounter (Signed)
   Pre-operative Risk Assessment    Patient Name: [name] [name]  DOB: 09-25-1950 MRN: [id number]      Request for Surgical Clearance    Procedure:  Colonoscopy  Date of Surgery:  Clearance 12/23/22                                 Surgeon:  Dr. Matthias Hughs Surgeon's Group or Practice Name:  [facility] Phone number:  [phone] Fax number:  [phone]   Type of Clearance Requested:  Hold Eliquis before surgery?    Type of Anesthesia:  Not Indicated   Additional requests/questions:    Signed, Narda Amber   11/19/2022, 2:59 PM

## 2022-11-22 DIAGNOSIS — R195 Other fecal abnormalities: Secondary | ICD-10-CM | POA: Insufficient documentation

## 2022-11-22 NOTE — Telephone Encounter (Signed)
   Patient Name: [name] [name]  DOB: 1950-11-24 MRN: [id number]  Primary Cardiologist: [name], MD  Chart reviewed as part of pre-operative protocol coverage. Pre-op clearance already addressed by colleagues in earlier phone notes. To summarize recommendations:  -Per office protocol, patient can hold Eliquis for 2 days prior to procedure. Please restart when medically safe to do so.  Medical clearance was not requested at this time.  Will route this bundled recommendation to requesting provider via Epic fax function and remove from pre-op pool. Please call with questions.  Sharlene Dory, PA-C 11/22/2022, 11:29 AM

## 2022-11-22 NOTE — Telephone Encounter (Signed)
Patient with diagnosis of A Fib on Eliquis for anticoagulation.    Procedure: Colonoscopy  Date of procedure: Clearance 12/23/22    CHA2DS2-VASc Score = 4  This indicates a 4.8% annual risk of stroke. The patient's score is based upon: CHF History: 0 HTN History: 1 Diabetes History: 1 Stroke History: 0 Vascular Disease History: 1 Age Score: 1 Gender Score: 0   CrCl 88 mL/min Platelet count 198K   Per office protocol, patient can hold Eliquis for 2 days prior to procedure.    **This guidance is not considered finalized until pre-operative APP has relayed final recommendations.**

## 2022-11-25 ENCOUNTER — Encounter: Payer: Self-pay | Admitting: Family Medicine

## 2022-11-25 ENCOUNTER — Ambulatory Visit (INDEPENDENT_AMBULATORY_CARE_PROVIDER_SITE_OTHER): Payer: Medicare HMO | Admitting: Family Medicine

## 2022-11-25 VITALS — BP 128/70 | HR 70 | Temp 97.8°F | Resp 18 | Ht 76.0 in | Wt 232.0 lb

## 2022-11-25 DIAGNOSIS — K4090 Unilateral inguinal hernia, without obstruction or gangrene, not specified as recurrent: Secondary | ICD-10-CM | POA: Diagnosis not present

## 2022-11-25 NOTE — Patient Instructions (Addendum)
Thank you for coming to the office today.  You most likely have an Left Inguinal Hernia.  This is caused by a weakness in your abdominal or groin muscles, and is caused by bowel or fatty tissue pushing through this weak spot causing pain and bulging.  Recommend a "Hernia Belt Truss", try to wear this regularly (do not need to wear to bed if pain is improved), until it feels better and then only with activities - If it is not improving then you may need to wear it every day, especially if you cannot have a surgery to fix the hernia   You can find a Truss OTC at [facility] or other pharmacy or supply store or online  Try to find positions that give you most relief, likely laying down with head lower than body will allow the hernia bulging to go back into place and feel better.   May take Tylenol as needed. Recommend to start taking Tylenol Extra Strength 500mg  tabs - take 1 to 2 tabs per dose (max 1000mg ) every 6-8 hours for pain (take regularly, don't skip a dose for next 7 days), max 24 hour daily dose is 6 tablets or 3000mg . In the future you can repeat the same everyday Tylenol course for 1-2 weeks at a time.    Can try topical ice packs or muscle rub if burning nerve sensation.    If significant worsening pain or you get bulging that does NOT go down or go away and CANNOT push back in, or nausea, vomiting, then it is very important to go directly to hospital ED for more immediate evaluation, as this can be a life threatening surgical emergency   GENERAL SURGERY  Referral in to Dr Tawanna Cooler [facility] [address] [place], [place] [address] Office: [phone]  Fax: [phone]   Please schedule a Follow-up Appointment to: Return if symptoms worsen or fail to improve.  If you have any other questions or concerns, please feel free to call the office or send a message through MyChart. You may also schedule an earlier appointment if  necessary.  Additionally, you may be receiving a survey about your experience at our office within a few days to 1 week by e-mail or mail. We value your feedback.  [name] Pilar, DO [facility], [place]

## 2022-11-25 NOTE — Progress Notes (Signed)
Subjective:    Patient ID: [name] [name], male    DOB: [birth year], 72 y.o.   MRN: [id number]  [name] [name] is a 72 y.o. male presenting on 11/25/2022 for Hearing Problem (Pt complains of inguinal hernia . Requesting a referral to general surgery. )   HPI  LEFT Inguinal Hernia Recent worse problem 1 past month with Heavy lifting and constipation straining increasing pressure and pain in LEFT groin with hernia now. History of Right inguinal hernia repair previously due to heavy lifting injury. He was seen by [facility] Dr Darnell Level      11/25/2022    8:12 AM 09/27/2022    8:59 AM 01/25/2022    9:50 AM  Depression screen PHQ 2/9  Decreased Interest 0 0 0  Down, Depressed, Hopeless 1 0 0  PHQ - 2 Score 1 0 0  Altered sleeping 0 0 0  Tired, decreased energy 1 0 0  Change in appetite 0 0 0  Feeling bad or failure about yourself  0 0 0  Trouble concentrating 0 0 0  Moving slowly or fidgety/restless 0 0 0  Suicidal thoughts 0 0 0  PHQ-9 Score 2 0 0  Difficult doing work/chores Not difficult at all  Not difficult at all    Social History   Tobacco Use   Smoking status: Never   Smokeless tobacco: Never  Vaping Use   Vaping Use: Never used  Substance Use Topics   Alcohol use: Not Currently   Drug use: No    Review of Systems Per HPI unless specifically indicated above     Objective:    BP 128/70 (BP Location: Right Arm, Patient Position: Sitting, Cuff Size: Large)   Pulse 70   Temp 97.8 F (36.6 C) (Oral)   Resp 18   Ht 6\' 4"  (1.93 m)   Wt 232 lb (105.2 kg)   SpO2 100%   BMI 28.24 kg/m   Wt Readings from Last 3 Encounters:  11/25/22 232 lb (105.2 kg)  09/27/22 240 lb (108.9 kg)  08/05/22 242 lb (109.8 kg)    Physical Exam Vitals and nursing note reviewed.  Constitutional:      General: He is not in acute distress.    Appearance: Normal appearance. He is well-developed. He is not diaphoretic.     Comments: Well-appearing, comfortable,  cooperative  HENT:     Head: Normocephalic and atraumatic.  Eyes:     General:        Right eye: No discharge.        Left eye: No discharge.     Conjunctiva/sclera: Conjunctivae normal.  Cardiovascular:     Rate and Rhythm: Normal rate.  Pulmonary:     Effort: Pulmonary effort is normal.  Abdominal:     Tenderness: There is no abdominal tenderness.     Hernia: A hernia (LEFT inguinal hernia with mild herination reducible on exam. currently not flared up. Provoked on valsalva.) is present.  Skin:    General: Skin is warm and dry.     Findings: No erythema or rash.  Neurological:     Mental Status: He is alert and oriented to person, place, and time.  Psychiatric:        Mood and Affect: Mood normal.        Behavior: Behavior normal.        Thought Content: Thought content normal.     Comments: Well groomed, good eye contact, normal speech and thoughts  Results for orders placed or performed in visit on 09/17/22  TSH  Result Value Ref Range   TSH 4.03 0.40 - 4.50 mIU/L  PSA  Result Value Ref Range   PSA 1.92 < OR = 4.00 ng/mL  Hemoglobin A1c  Result Value Ref Range   Hgb A1c MFr Bld 8.5 (H) <5.7 % of total Hgb   Mean Plasma Glucose 197 mg/dL   eAG (mmol/L) 16.1 mmol/L  Lipid panel  Result Value Ref Range   Cholesterol 97 <200 mg/dL   HDL 40 > OR = 40 mg/dL   Triglycerides 65 <096 mg/dL   LDL Cholesterol (Calc) 43 mg/dL (calc)   Total CHOL/HDL Ratio 2.4 <5.0 (calc)   Non-HDL Cholesterol (Calc) 57 <045 mg/dL (calc)  CBC with Differential/Platelet  Result Value Ref Range   WBC 7.1 3.8 - 10.8 Thousand/uL   RBC 4.71 4.20 - 5.80 Million/uL   Hemoglobin 13.8 13.2 - 17.1 g/dL   HCT 40.9 81.1 - 91.4 %   MCV 90.2 80.0 - 100.0 fL   MCH 29.3 27.0 - 33.0 pg   MCHC 32.5 32.0 - 36.0 g/dL   RDW 78.2 95.6 - 21.3 %   Platelets 198 140 - 400 Thousand/uL   MPV 11.8 7.5 - 12.5 fL   Neutro Abs 4,246 1,500 - 7,800 cells/uL   Lymphs Abs 2,123 850 - 3,900 cells/uL   Absolute  Monocytes 540 200 - 950 cells/uL   Eosinophils Absolute 142 15 - 500 cells/uL   Basophils Absolute 50 0 - 200 cells/uL   Neutrophils Relative % 59.8 %   Total Lymphocyte 29.9 %   Monocytes Relative 7.6 %   Eosinophils Relative 2.0 %   Basophils Relative 0.7 %  COMPLETE METABOLIC PANEL WITH GFR  Result Value Ref Range   Glucose, Bld 155 (H) 65 - 99 mg/dL   BUN 20 7 - 25 mg/dL   Creat 0.86 5.78 - 4.69 mg/dL   eGFR 66 > OR = 60 GE/XBM/8.41L2   BUN/Creatinine Ratio SEE NOTE: 6 - 22 (calc)   Sodium 141 135 - 146 mmol/L   Potassium 4.6 3.5 - 5.3 mmol/L   Chloride 104 98 - 110 mmol/L   CO2 30 20 - 32 mmol/L   Calcium 9.7 8.6 - 10.3 mg/dL   Total Protein 7.0 6.1 - 8.1 g/dL   Albumin 4.4 3.6 - 5.1 g/dL   Globulin 2.6 1.9 - 3.7 g/dL (calc)   AG Ratio 1.7 1.0 - 2.5 (calc)   Total Bilirubin 0.7 0.2 - 1.2 mg/dL   Alkaline phosphatase (APISO) 77 35 - 144 U/L   AST 25 10 - 35 U/L   ALT 28 9 - 46 U/L      Assessment & Plan:   Problem List Items Addressed This Visit   None Visit Diagnoses     Left inguinal hernia    -  Primary   Relevant Orders   Ambulatory referral to General Surgery       Consistent with gradual worsening acute Left inguinal hernia - Seems to be currently stable, has tried to improve and limit provoking factors, but it can flare up if lifting and increased activity - History of prior R inguinal hernia repair years ago Dr Darnell Level   Plan: Proceed with referral to General Surgery - back to [facility] [place] Dr Gerrit Friends for eval for repair Limit provoking scenarios and heavy lifting Trial on Hernia Truss, OTC meds Strict return criteria and when to go to hospital ED for  more acute evaluation if any significant worsening, constant pain, systemic symptoms, or potential incarceration bulge that does not reduce  Follow-up as needed  Orders Placed This Encounter  Procedures   Ambulatory referral to General Surgery    Referral Priority:   Routine    Referral Type:   Surgical     Referral Reason:   Specialty Services Required    Requested Specialty:   General Surgery    Number of Visits Requested:   1     No orders of the defined types were placed in this encounter.     Follow up plan: Return if symptoms worsen or fail to improve.   Saralyn Pilar, DO [facility] 11/25/2022, 8:47 AM

## 2022-12-07 DIAGNOSIS — E119 Type 2 diabetes mellitus without complications: Secondary | ICD-10-CM | POA: Diagnosis not present

## 2022-12-07 DIAGNOSIS — H2513 Age-related nuclear cataract, bilateral: Secondary | ICD-10-CM | POA: Diagnosis not present

## 2022-12-07 LAB — HM DIABETES EYE EXAM

## 2022-12-14 NOTE — Telephone Encounter (Signed)
Third request sent requesting cardiac clearance for upcoming colonoscopy  Dr. Vivien Rossetti GI phone [phone], fax [phone]

## 2022-12-14 NOTE — Telephone Encounter (Signed)
Previous clearance note faxed to requesting provider's office as requested.  Please see routing history in patient's chart for further questions.

## 2022-12-15 NOTE — Telephone Encounter (Signed)
Dr. Buccini's office called asking about clearance. Surgery scheduler states she see's the clearance for holding blood thinner but needs cardiac clearance as well. This seems to have been missed on our end.   I d/w the pre op APP today who states the pt will need a tele pre op APP 12/17/22 as last seen 07/2022. Surgery scheduler is agreeable to our tele appt for the pt. I assured her that once the pt is cleared we will then fax completed clearance notes. Surgery scheduler thanked me for the help.   I then called and left message for the pt asking him to call back to schedule a tele pre op appt 12/17/22 @ 3 pm. I will put him on the schedule to hold the time slot for him, still need him to call to confirm and go over medications.

## 2022-12-17 ENCOUNTER — Ambulatory Visit: Payer: Medicare HMO | Attending: Cardiovascular Disease | Admitting: Student

## 2022-12-17 DIAGNOSIS — Z01810 Encounter for preprocedural cardiovascular examination: Secondary | ICD-10-CM

## 2022-12-17 NOTE — Progress Notes (Signed)
Virtual Visit via Telephone Note   Because of [name] [name] co-morbid illnesses, he is at least at moderate risk for complications without adequate follow up.  This format is felt to be most appropriate for this patient at this time.  The patient did not have access to video technology/had technical difficulties with video requiring transitioning to audio format only (telephone).  All issues noted in this document were discussed and addressed.  No physical exam could be performed with this format.  Please refer to the patient's chart for his consent to telehealth for [facility].  Evaluation Performed:  Preoperative cardiovascular risk assessment _____________   Date:  12/17/2022   Patient ID:  [name] [name], DOB [birth year], MRN [id number] Patient Location:  Home Provider location:   Office  Primary Care Provider:  Smitty Cords, DO Primary Cardiologist:  [name], MD  Chief Complaint / Patient Profile   72 y.o. y/o male with a h/o CAD s/p CABG 2013,permanent afib on anticoagulation, hypertension, hyperlipidemia, CKD stage II, T2DM who is pending colonoscopy by Dr. Matthias Hughs and presents today for telephonic preoperative cardiovascular risk assessment.  History of Present Illness    [name] [name] is a 72 y.o. male who presents via audio/video conferencing for a telehealth visit today.  Pt was last seen in cardiology clinic on 08/05/2022 by Dr. Okey Dupre.  At that time [name] [name] was doing well.  The patient is now pending procedure as outlined above. Since his last visit, he continues to do well from a cardiac standpoint. Patient denies shortness of breath or dyspnea on exertion. No chest pain, pressure, or tightness. Denies lower extremity edema, orthopnea, or PND. No palpitations. He stays active walking for at least 20 minutes every day and performing yard work.   Past Medical History    Past Medical History:  Diagnosis Date   Allergy    Coronary  artery disease 2013   a. LHC 2013 w/ severe 3-V CAD; b. s/p 4-V CABG in 2013 (LIMA-LAD, VG-D2, seq VG-PLA/PDA, VG-OM; c. LHC 2015 patent LIMA-LAD, occluded VGs, med Rx   Diastolic dysfunction    a. 07/2017 Echo: eF 50-55%, mild LVH. no rwma, Gr2 DD. Mildly dil LA/RA. Nl RV size/fxn.   Essential hypertension    Headache(784.0)    Hyperlipidemia LDL goal <70    Permanent atrial fibrillation (HCC)    Type II diabetes mellitus (HCC)    Past Surgical History:  Procedure Laterality Date   CARDIAC CATHETERIZATION  12/18/13   Occluded SVGs   CARDIOVERSION N/A 07/22/2020   Procedure: CARDIOVERSION;  Surgeon: [name], MD;  Location: [facility] ORS;  Service: Cardiovascular;  Laterality: N/A;   CORONARY ARTERY BYPASS GRAFT  04/28/2012   Procedure: CORONARY ARTERY BYPASS GRAFTING (CABG);  Surgeon: [name], MD;  Location: [facility] OR;  Service: Open Heart Surgery;  Laterality: N/A;  Right saphenous vein used for bypass grafting. Five  bypasses performed including left mammary artery.    INGUINAL HERNIA REPAIR     right    LEFT HEART CATHETERIZATION WITH CORONARY ANGIOGRAM N/A 04/27/2012   Procedure: LEFT HEART CATHETERIZATION WITH CORONARY ANGIOGRAM;  Surgeon: [name], MD;  Location: [facility] CATH LAB;  Service: Cardiovascular;  Laterality: N/A;   LEFT HEART CATHETERIZATION WITH CORONARY/GRAFT ANGIOGRAM N/A 12/18/2013   Procedure: LEFT HEART CATHETERIZATION WITH Isabel Caprice;  Surgeon: [name], MD;  Location: [facility] CATH LAB;  Service: Cardiovascular;  Laterality: N/A;    Allergies  Allergies  Allergen Reactions   Penicillins Hives, Itching and Rash    Home Medications    Prior to Admission medications   Medication Sig Start Date End Date Taking? Authorizing Provider  acetaminophen (TYLENOL) 325 MG tablet Take 650 mg by mouth every 6 (six) hours as needed for headache.    [provider]  atorvastatin (LIPITOR) 80 MG tablet Take 1 tablet (80 mg total) by mouth  daily. 08/05/22   End, [name], MD  Coenzyme Q10 (CO Q 10) 100 MG CAPS Take 200 mg by mouth daily. 10/23/12   [name], MD  ELIQUIS 5 MG TABS tablet TAKE 1 TABLET BY MOUTH TWICE A DAY 07/05/22   End, [name], MD  fexofenadine (ALLEGRA) 180 MG tablet Take 180 mg by mouth daily.    [provider]  fish oil-omega-3 fatty acids 1000 MG capsule Take 1 g by mouth daily.    [provider]  isosorbide mononitrate (IMDUR) 30 MG 24 hr tablet Take 1 tablet (30 mg total) by mouth daily. 08/05/22   End, [name], MD  lisinopril (ZESTRIL) 10 MG tablet Take 1 tablet (10 mg total) by mouth daily. 08/05/22   End, [name], MD  metFORMIN (GLUCOPHAGE) 500 MG tablet Take 2 tablets (1,000 mg total) by mouth 2 (two) times daily with a meal. 09/27/22   [name], [name], DO  metoprolol tartrate (LOPRESSOR) 25 MG tablet Take 1 tablet (25 mg total) by mouth 2 (two) times daily. 08/05/22   End, [name], MD  nitroGLYCERIN (NITROSTAT) 0.4 MG SL tablet Place 1 tablet (0.4 mg total) under the tongue every 5 (five) minutes as needed for chest pain. 06/14/19   [name], MD    Physical Exam    Vital Signs:  [name] does not have vital signs available for review today.  Given telephonic nature of communication, physical exam is limited. AAOx3. NAD. Normal affect.  Speech and respirations are unlabored.  Accessory Clinical Findings    None  Assessment & Plan    Primary Cardiologist: [name], MD  Preoperative cardiovascular risk assessment. Colonoscopy by Dr. Matthias Hughs.  Chart reviewed as part of pre-operative protocol coverage. According to the RCRI, patient has a 0.9% risk of MACE. Patient reports activity equivalent to >4.0 METS (walking at least 20 minutes daily, doing yard work).   Given past medical history and time since last visit, based on ACC/AHA guidelines, ENAN OUTMAN would be at acceptable risk for the planned procedure without further  cardiovascular testing.   Patient was advised that if he develops new symptoms prior to surgery to contact our office to arrange a follow-up appointment.  he verbalized understanding.  Per pharm D, patient may hold Eliquis for 2 days prior to procedure.   I will route this recommendation to the requesting party via Epic fax function.  Please call with questions.  Time:   Today, I have spent 5 minutes with the patient with telehealth technology discussing medical history, symptoms, and management plan.     Carlos Levering, NP  12/17/2022, 10:05 AM

## 2022-12-17 NOTE — Telephone Encounter (Signed)
I s/w the pt's wife  and I greatly apologized for my last call. I missed that the pt had his tele pre op appt today at 3 pm. I again apologized for the call. Pt 's wife said that's ok she was just confused but she thanked me for speaking with her.[name] [name]

## 2022-12-17 NOTE — Telephone Encounter (Signed)
Lvm to call back office to schedule telephone visit for clearance for up an coming procedure.

## 2022-12-17 NOTE — Telephone Encounter (Signed)
I did try again to reach the pt as his procedure is coming up on 12/23/22.

## 2022-12-23 DIAGNOSIS — Z86010 Personal history of colonic polyps: Secondary | ICD-10-CM | POA: Diagnosis not present

## 2022-12-23 DIAGNOSIS — Z09 Encounter for follow-up examination after completed treatment for conditions other than malignant neoplasm: Secondary | ICD-10-CM | POA: Diagnosis not present

## 2022-12-23 DIAGNOSIS — D122 Benign neoplasm of ascending colon: Secondary | ICD-10-CM | POA: Diagnosis not present

## 2022-12-23 DIAGNOSIS — D123 Benign neoplasm of transverse colon: Secondary | ICD-10-CM | POA: Diagnosis not present

## 2022-12-23 LAB — HM COLONOSCOPY

## 2022-12-27 ENCOUNTER — Ambulatory Visit: Payer: Medicare HMO

## 2022-12-27 DIAGNOSIS — D122 Benign neoplasm of ascending colon: Secondary | ICD-10-CM | POA: Diagnosis not present

## 2022-12-27 DIAGNOSIS — D123 Benign neoplasm of transverse colon: Secondary | ICD-10-CM | POA: Diagnosis not present

## 2022-12-29 ENCOUNTER — Encounter: Payer: Self-pay | Admitting: Family Medicine

## 2022-12-31 ENCOUNTER — Other Ambulatory Visit: Payer: Self-pay | Admitting: Internal Medicine

## 2022-12-31 DIAGNOSIS — I4819 Other persistent atrial fibrillation: Secondary | ICD-10-CM

## 2022-12-31 NOTE — Telephone Encounter (Signed)
Please review

## 2022-12-31 NOTE — Telephone Encounter (Signed)
Eliquis 5mg  refill request received. Patient is 72 years old, weight-105.2kg, Crea-1.19 on 09/20/22, Diagnosis-Afib, and last seen by Dr. Okey Dupre on 08/05/22. Dose is appropriate based on dosing criteria. Will send in refill to requested pharmacy.

## 2023-01-04 ENCOUNTER — Telehealth: Payer: Self-pay | Admitting: *Deleted

## 2023-01-04 ENCOUNTER — Ambulatory Visit: Payer: Self-pay | Admitting: Surgery

## 2023-01-04 DIAGNOSIS — K4090 Unilateral inguinal hernia, without obstruction or gangrene, not specified as recurrent: Secondary | ICD-10-CM | POA: Diagnosis not present

## 2023-01-04 NOTE — Telephone Encounter (Signed)
   Pre-operative Risk Assessment    Patient Name: [name] [name]  DOB: 12/29/1950 MRN: [id number]      Request for Surgical Clearance    Procedure:   INGUINAL HERNIA SURGERY  Date of Surgery:  Clearance TBD                                 Surgeon:  DR. Darnell Level Surgeon's Group or Practice Name:  [employer] [employer] Phone number:  [phone] Fax number:  [phone] ATTN: Michel Bickers, LPN   Type of Clearance Requested:   - Medical  - Pharmacy:  Hold Apixaban (Eliquis)     Type of Anesthesia:  General    Additional requests/questions:    Elpidio Anis   01/04/2023, 3:14 PM

## 2023-01-05 NOTE — Telephone Encounter (Signed)
Patient with diagnosis of afib on Eliquis for anticoagulation.    Procedure: inguinal hernia surgery Date of procedure: TBD  CHA2DS2-VASc Score = 4  This indicates a 4.8% annual risk of stroke. The patient's score is based upon: CHF History: 0 HTN History: 1 Diabetes History: 1 Stroke History: 0 Vascular Disease History: 1 Age Score: 1 Gender Score: 0   CrCl 24mL/min Platelet count 198K  Per office protocol, patient can hold Eliquis for 2-3 days prior to procedure.    **This guidance is not considered finalized until pre-operative APP has relayed final recommendations.**

## 2023-01-05 NOTE — Telephone Encounter (Signed)
   Primary Cardiologist: [name], MD  Chart reviewed as part of pre-operative protocol coverage. Given past medical history and time since last visit, based on ACC/AHA guidelines, [name] [name] would be at acceptable risk for the planned procedure without further cardiovascular testing.   Called and spoke to patient's wife and advised that if he develops new symptoms prior to surgery to contact our office to arrange a follow-up appointment. She reported he had no present concerns and she would advise him to call if he developed concerning symptoms.   She reports that he resumed his Eliquis the day following his colonoscopy.  I advised her that he may again hold Eliquis for 2 to 3 days prior to hernia surgery and should resume as soon as hemodynamically stable following the procedure.  I will route this recommendation to the requesting party via Epic fax function and remove from pre-op pool.  Please call with questions.  Levi Aland, NP-C 01/05/2023, 1:21 PM [address], [address] Office [phone] Fax [phone]

## 2023-01-12 ENCOUNTER — Other Ambulatory Visit: Payer: Self-pay

## 2023-01-12 ENCOUNTER — Encounter (HOSPITAL_BASED_OUTPATIENT_CLINIC_OR_DEPARTMENT_OTHER): Payer: Self-pay | Admitting: Surgery

## 2023-01-12 NOTE — Progress Notes (Addendum)
Spoke w/ via phone for pre-op interview---pt wife nancy Lab needs dos----  I stat             Lab results------see below Echo 08-04-2021 epic, EKG 08-05-2022 epic COVID test -----patient states asymptomatic no test needed Arrive at -------915 01-20-2023 NPO after MN NO Solid Food.  Clear liquids from MN until---815 Med rec completed Medications to take morning of surgery -----isosorbide mononitrate, metoprolol tartrate, allegra Diabetic medication -----none day of surgery Patient instructed no nail polish to be worn day of surgery Patient instructed to bring photo id and insurance card day of surgery Patient aware to have Driver (ride ) / caregiver   wife nanacy  for 24 hours after surgery  Patient Special Instructions -----none Pre-Op special Instructions -----none Patient verbalized understanding of instructions that were given at this phone interview. Patient denies shortness of breath, chest pain, fever, cough at this phone interview.  -lov dr c end 08-05-2022 epic, cardiac clearance note/note to stop eliquis  2 to 3 days dated 01-05-2023 chart/epic michelle swinyer NP (last dose of eliquis to be 01-16-2023 per wife nancy  she  was instructed by dr gerkin to stop eliquis 3 days before surgery)

## 2023-01-16 ENCOUNTER — Encounter (HOSPITAL_BASED_OUTPATIENT_CLINIC_OR_DEPARTMENT_OTHER): Payer: Self-pay | Admitting: Surgery

## 2023-01-16 DIAGNOSIS — K4090 Unilateral inguinal hernia, without obstruction or gangrene, not specified as recurrent: Secondary | ICD-10-CM

## 2023-01-16 NOTE — H&P (Signed)
REFERRING PHYSICIAN: Saralyn Pilar  PROVIDER:  Myra Rude, MD   Chief Complaint: New Consultation and Inguinal Hernia  History of Present Illness:  Patient is referred by his care physician, Dr.Alexander Althea Charon, for surgical evaluation and management of a left inguinal hernia. Patient had been doing some strenuous lifting. He also had noted some constipation. He developed left groin pain and noted a bulge in the left groin. Patient had had a previous right inguinal hernia repair performed by me approximately 6 years ago. He has had no signs or symptoms of recurrence. Patient has been taking MiraLAX to help with his bowel movements. He now presents to discuss repair of his left inguinal hernia.  Patient is on chronic anticoagulation with Eliquis due to atrial fibrillation. He is followed by his cardiologist in [place]. Patient did have a recent colonoscopy performed by Dr. Vida Rigger. Patient did stop his Eliquis 2 days prior to his procedure.  Review of Systems: A complete review of systems was obtained from the patient. I have reviewed this information and discussed as appropriate with the patient. See HPI as well for other ROS.  Review of Systems  Constitutional: Negative.  HENT: Negative.  Eyes: Negative.  Respiratory: Negative.  Cardiovascular: Negative.  Gastrointestinal: Positive for constipation.  Genitourinary: Negative.  Musculoskeletal:  Left groin pain  Skin: Negative.  Neurological: Negative.  Endo/Heme/Allergies: Negative.  Psychiatric/Behavioral: Negative.    Medical History: Past Medical History:  Diagnosis Date  Chronic kidney disease  Diabetes mellitus without complication (CMS/HHS-HCC)  Heart valve disease  Hyperlipidemia  Hypertension   Patient Active Problem List  Diagnosis  Left inguinal hernia   Past Surgical History:  Procedure Laterality Date  heart surgery  HERNIA REPAIR    Allergies  Allergen Reactions   Penicillins Hives, Itching and Rash   Current Outpatient Medications on File Prior to Visit  Medication Sig Dispense Refill  acetaminophen (TYLENOL) 325 MG tablet Take by mouth  atorvastatin (LIPITOR) 80 MG tablet Take 80 mg by mouth once daily  ELIQUIS 5 mg tablet Take 1 tablet by mouth 2 (two) times daily  fexofenadine (ALLEGRA) 180 MG tablet Take 180 mg by mouth once daily  isosorbide mononitrate (IMDUR) 30 MG ER tablet Take 30 mg by mouth once daily  lisinopriL (ZESTRIL) 10 MG tablet Take 1 tablet by mouth once daily  metFORMIN (GLUCOPHAGE) 500 MG tablet TAKE 2 TABLETS (1,000 MG TOTAL) BY MOUTH 2 (TWO) TIMES DAILY WITH A MEAL.  metoprolol tartrate (LOPRESSOR) 25 MG tablet Take 25 mg by mouth 2 (two) times daily  omega-3-dha-epa-fish oil 300-1,000 mg capsule Take 1 g by mouth once daily   No current facility-administered medications on file prior to visit.   Family History  Problem Relation Age of Onset  Stroke Mother  High blood pressure (Hypertension) Mother  Hyperlipidemia (Elevated cholesterol) Mother  Coronary Artery Disease (Blocked arteries around heart) Mother  High blood pressure (Hypertension) Father  Hyperlipidemia (Elevated cholesterol) Father  Coronary Artery Disease (Blocked arteries around heart) Father  Diabetes Father    Social History   Tobacco Use  Smoking Status Never  Smokeless Tobacco Never    Social History   Socioeconomic History  Marital status: Married  Tobacco Use  Smoking status: Never  Smokeless tobacco: Never  Substance and Sexual Activity  Alcohol use: Not Currently  Drug use: Never   Objective:   Vitals:  BP: (!) 147/71  Pulse: 90  Temp: 36.1 C (97 F)  SpO2: 99%  Weight: (!) 102.1  kg (225 lb)  Height: 182.9 cm (6')   Body mass index is 30.52 kg/m.  Physical Exam   GENERAL APPEARANCE Comfortable, no acute issues Development: normal Gross deformities: none  SKIN Rash, lesions, ulcers: none Induration, erythema:  none Nodules: none palpable  EYES Conjunctiva and lids: normal Pupils: equal and reactive  EARS, NOSE, MOUTH, THROAT External ears: no lesion or deformity External nose: no lesion or deformity Hearing: grossly normal  NECK Symmetric: yes Trachea: midline Thyroid: no palpable nodules in the thyroid bed  ABDOMEN There is a very small umbilical hernia which is asymptomatic. Fascial defect is quite small. This does not require repair.  GENITOURINARY/RECTAL Well-healed surgical incision right inguinal region. Palpation in the right inguinal canal with cough and Valsalva shows no sign of recurrence. There is a visible bulge on the left. On palpation this augments with cough and Valsalva. There is mild tenderness to palpation. It is at least partially reducible.  MUSCULOSKELETAL Station and gait: normal Digits and nails: no clubbing or cyanosis Muscle strength: grossly normal all extremities Range of motion: grossly normal all extremities Deformity: none  LYMPHATIC Cervical: none palpable Supraclavicular: none palpable  PSYCHIATRIC Oriented to person, place, and time: yes Mood and affect: normal for situation Judgment and insight: appropriate for situation   Assessment and Plan:   Left inguinal hernia  Patient is referred by his primary care physician for surgical evaluation and management of a left inguinal hernia. Today we discussed the procedure. I provided him with written literature on hernia surgery to review at home. Patient had had a previous right inguinal hernia repair. Today we discussed the size and location of the incision. We discussed the use of prosthetic mesh. We discussed the use of prosthetic mesh. We discussed doing this as an outpatient surgical procedure. We discussed restrictions on his activities following surgery. We discussed the risk of recurrence. The patient understands and would like to proceed with surgery in the near future.  We will notify his  cardiologist and obtain cardiac clearance. Patient will need to discontinue his anticoagulation 2 to 3 days prior to his procedure and we will plan to resume his anticoagulation on the day following his surgical procedure.   Darnell Level, MD [facility] practice Office: [phone]

## 2023-01-20 ENCOUNTER — Other Ambulatory Visit: Payer: Self-pay

## 2023-01-20 ENCOUNTER — Ambulatory Visit (HOSPITAL_BASED_OUTPATIENT_CLINIC_OR_DEPARTMENT_OTHER)
Admission: RE | Admit: 2023-01-20 | Discharge: 2023-01-20 | Disposition: A | Discharge status: Home / Self Care | Payer: Medicare HMO | Attending: Surgery | Admitting: Surgery

## 2023-01-20 ENCOUNTER — Encounter (HOSPITAL_BASED_OUTPATIENT_CLINIC_OR_DEPARTMENT_OTHER)
Admission: RE | Disposition: A | Discharge status: Home / Self Care | Payer: Self-pay | Source: Home / Self Care | Attending: Surgery

## 2023-01-20 ENCOUNTER — Encounter (HOSPITAL_BASED_OUTPATIENT_CLINIC_OR_DEPARTMENT_OTHER): Payer: Self-pay | Admitting: Surgery

## 2023-01-20 ENCOUNTER — Ambulatory Visit (HOSPITAL_BASED_OUTPATIENT_CLINIC_OR_DEPARTMENT_OTHER): Payer: Medicare HMO | Admitting: Anesthesiology

## 2023-01-20 DIAGNOSIS — E1122 Type 2 diabetes mellitus with diabetic chronic kidney disease: Secondary | ICD-10-CM | POA: Diagnosis not present

## 2023-01-20 DIAGNOSIS — I130 Hypertensive heart and chronic kidney disease with heart failure and stage 1 through stage 4 chronic kidney disease, or unspecified chronic kidney disease: Secondary | ICD-10-CM

## 2023-01-20 DIAGNOSIS — K4090 Unilateral inguinal hernia, without obstruction or gangrene, not specified as recurrent: Secondary | ICD-10-CM

## 2023-01-20 DIAGNOSIS — E785 Hyperlipidemia, unspecified: Secondary | ICD-10-CM | POA: Diagnosis not present

## 2023-01-20 DIAGNOSIS — Z79899 Other long term (current) drug therapy: Secondary | ICD-10-CM | POA: Insufficient documentation

## 2023-01-20 DIAGNOSIS — N182 Chronic kidney disease, stage 2 (mild): Secondary | ICD-10-CM

## 2023-01-20 DIAGNOSIS — Z951 Presence of aortocoronary bypass graft: Secondary | ICD-10-CM | POA: Insufficient documentation

## 2023-01-20 DIAGNOSIS — I4891 Unspecified atrial fibrillation: Secondary | ICD-10-CM | POA: Diagnosis not present

## 2023-01-20 DIAGNOSIS — N189 Chronic kidney disease, unspecified: Secondary | ICD-10-CM | POA: Diagnosis not present

## 2023-01-20 DIAGNOSIS — Z7901 Long term (current) use of anticoagulants: Secondary | ICD-10-CM | POA: Insufficient documentation

## 2023-01-20 DIAGNOSIS — K5900 Constipation, unspecified: Secondary | ICD-10-CM | POA: Insufficient documentation

## 2023-01-20 DIAGNOSIS — Z87891 Personal history of nicotine dependence: Secondary | ICD-10-CM | POA: Diagnosis not present

## 2023-01-20 DIAGNOSIS — I5030 Unspecified diastolic (congestive) heart failure: Secondary | ICD-10-CM | POA: Diagnosis not present

## 2023-01-20 DIAGNOSIS — I4892 Unspecified atrial flutter: Secondary | ICD-10-CM | POA: Diagnosis not present

## 2023-01-20 DIAGNOSIS — I129 Hypertensive chronic kidney disease with stage 1 through stage 4 chronic kidney disease, or unspecified chronic kidney disease: Secondary | ICD-10-CM | POA: Insufficient documentation

## 2023-01-20 DIAGNOSIS — I2510 Atherosclerotic heart disease of native coronary artery without angina pectoris: Secondary | ICD-10-CM | POA: Diagnosis not present

## 2023-01-20 DIAGNOSIS — Z7984 Long term (current) use of oral hypoglycemic drugs: Secondary | ICD-10-CM | POA: Diagnosis not present

## 2023-01-20 DIAGNOSIS — Z9889 Other specified postprocedural states: Secondary | ICD-10-CM | POA: Diagnosis not present

## 2023-01-20 DIAGNOSIS — I25118 Atherosclerotic heart disease of native coronary artery with other forms of angina pectoris: Secondary | ICD-10-CM | POA: Diagnosis not present

## 2023-01-20 DIAGNOSIS — Z01818 Encounter for other preprocedural examination: Secondary | ICD-10-CM

## 2023-01-20 HISTORY — DX: Unilateral inguinal hernia, without obstruction or gangrene, not specified as recurrent: K40.90

## 2023-01-20 HISTORY — PX: INGUINAL HERNIA REPAIR: SHX194

## 2023-01-20 LAB — POCT I-STAT, CHEM 8
BUN: 22 mg/dL (ref 8–23)
Calcium, Ion: 1.12 mmol/L — ABNORMAL LOW (ref 1.15–1.40)
Chloride: 106 mmol/L (ref 98–111)
Creatinine, Ser: 1.2 mg/dL (ref 0.61–1.24)
Glucose, Bld: 177 mg/dL — ABNORMAL HIGH (ref 70–99)
HCT: 46 % (ref 39.0–52.0)
Hemoglobin: 15.6 g/dL (ref 13.0–17.0)
Potassium: 4.5 mmol/L (ref 3.5–5.1)
Sodium: 140 mmol/L (ref 135–145)
TCO2: 22 mmol/L (ref 22–32)

## 2023-01-20 SURGERY — REPAIR, HERNIA, INGUINAL, ADULT
Anesthesia: General | Site: Inguinal | Laterality: Left

## 2023-01-20 MED ORDER — OXYCODONE HCL 5 MG PO TABS
5.0000 mg | ORAL_TABLET | Freq: Once | ORAL | Status: AC
  Administered 2023-01-20: 5 mg via ORAL

## 2023-01-20 MED ORDER — TRAMADOL HCL 50 MG PO TABS: 50.0000 mg | ORAL_TABLET | Freq: Four times a day (QID) | ORAL | 0 refills | Status: DC | PRN

## 2023-01-20 MED ORDER — EPHEDRINE SULFATE-NACL 50-0.9 MG/10ML-% IV SOSY
PREFILLED_SYRINGE | INTRAVENOUS | Status: DC | PRN
  Administered 2023-01-20: 2.5 mg via INTRAVENOUS

## 2023-01-20 MED ORDER — ACETAMINOPHEN 500 MG PO TABS
ORAL_TABLET | ORAL | Status: AC
  Filled 2023-01-20: qty 2

## 2023-01-20 MED ORDER — DEXAMETHASONE SODIUM PHOSPHATE 10 MG/ML IJ SOLN
INTRAMUSCULAR | Status: DC | PRN
  Administered 2023-01-20: 5 mg via INTRAVENOUS

## 2023-01-20 MED ORDER — FENTANYL CITRATE (PF) 100 MCG/2ML IJ SOLN: 25.0000 ug | INTRAMUSCULAR | Status: DC | PRN

## 2023-01-20 MED ORDER — ONDANSETRON HCL 4 MG/2ML IJ SOLN
INTRAMUSCULAR | Status: AC
  Filled 2023-01-20: qty 2

## 2023-01-20 MED ORDER — FENTANYL CITRATE (PF) 100 MCG/2ML IJ SOLN
INTRAMUSCULAR | Status: AC
  Filled 2023-01-20: qty 2

## 2023-01-20 MED ORDER — ONDANSETRON HCL 4 MG/2ML IJ SOLN
INTRAMUSCULAR | Status: DC | PRN
  Administered 2023-01-20: 4 mg via INTRAVENOUS

## 2023-01-20 MED ORDER — SUGAMMADEX SODIUM 200 MG/2ML IV SOLN
INTRAVENOUS | Status: DC | PRN
  Administered 2023-01-20: 200 mg via INTRAVENOUS
  Administered 2023-01-20: 100 mg via INTRAVENOUS

## 2023-01-20 MED ORDER — OXYCODONE HCL 5 MG PO TABS
ORAL_TABLET | ORAL | Status: AC
  Filled 2023-01-20: qty 1

## 2023-01-20 MED ORDER — PHENYLEPHRINE 80 MCG/ML (10ML) SYRINGE FOR IV PUSH (FOR BLOOD PRESSURE SUPPORT)
PREFILLED_SYRINGE | INTRAVENOUS | Status: AC
  Filled 2023-01-20: qty 10

## 2023-01-20 MED ORDER — ONDANSETRON HCL 4 MG/2ML IJ SOLN: 4.0000 mg | Freq: Once | INTRAMUSCULAR | Status: DC | PRN

## 2023-01-20 MED ORDER — ROCURONIUM BROMIDE 10 MG/ML (PF) SYRINGE
PREFILLED_SYRINGE | INTRAVENOUS | Status: AC
  Filled 2023-01-20: qty 10

## 2023-01-20 MED ORDER — LIDOCAINE 2% (20 MG/ML) 5 ML SYRINGE
INTRAMUSCULAR | Status: DC | PRN
  Administered 2023-01-20: 100 mg via INTRAVENOUS

## 2023-01-20 MED ORDER — FENTANYL CITRATE (PF) 100 MCG/2ML IJ SOLN
INTRAMUSCULAR | Status: DC | PRN
  Administered 2023-01-20: 100 ug via INTRAVENOUS

## 2023-01-20 MED ORDER — CHLORHEXIDINE GLUCONATE CLOTH 2 % EX PADS: 6.0000 | MEDICATED_PAD | Freq: Once | CUTANEOUS | Status: DC

## 2023-01-20 MED ORDER — ACETAMINOPHEN 500 MG PO TABS
1000.0000 mg | ORAL_TABLET | Freq: Once | ORAL | Status: AC
  Administered 2023-01-20: 1000 mg via ORAL

## 2023-01-20 MED ORDER — DEXAMETHASONE SODIUM PHOSPHATE 10 MG/ML IJ SOLN
INTRAMUSCULAR | Status: AC
  Filled 2023-01-20: qty 1

## 2023-01-20 MED ORDER — CIPROFLOXACIN IN D5W 400 MG/200ML IV SOLN
400.0000 mg | INTRAVENOUS | Status: AC
  Administered 2023-01-20: 400 mg via INTRAVENOUS

## 2023-01-20 MED ORDER — CIPROFLOXACIN IN D5W 400 MG/200ML IV SOLN
INTRAVENOUS | Status: AC
  Filled 2023-01-20: qty 200

## 2023-01-20 MED ORDER — BUPIVACAINE LIPOSOME 1.3 % IJ SUSP
INTRAMUSCULAR | Status: DC | PRN
  Administered 2023-01-20: 40 mL

## 2023-01-20 MED ORDER — AMISULPRIDE (ANTIEMETIC) 5 MG/2ML IV SOLN: 10.0000 mg | Freq: Once | INTRAVENOUS | Status: DC | PRN

## 2023-01-20 MED ORDER — PROPOFOL 10 MG/ML IV BOLUS
INTRAVENOUS | Status: AC
  Filled 2023-01-20: qty 20

## 2023-01-20 MED ORDER — ROCURONIUM BROMIDE 10 MG/ML (PF) SYRINGE
PREFILLED_SYRINGE | INTRAVENOUS | Status: DC | PRN
  Administered 2023-01-20 (×3): 10 mg via INTRAVENOUS
  Administered 2023-01-20: 40 mg via INTRAVENOUS

## 2023-01-20 MED ORDER — PROPOFOL 10 MG/ML IV BOLUS
INTRAVENOUS | Status: DC | PRN
  Administered 2023-01-20: 200 mg via INTRAVENOUS

## 2023-01-20 MED ORDER — LACTATED RINGERS IV SOLN: INTRAVENOUS | Status: DC

## 2023-01-20 MED ORDER — 0.9 % SODIUM CHLORIDE (POUR BTL) OPTIME
TOPICAL | Status: DC | PRN
  Administered 2023-01-20: 500 mL

## 2023-01-20 MED ORDER — PHENYLEPHRINE 80 MCG/ML (10ML) SYRINGE FOR IV PUSH (FOR BLOOD PRESSURE SUPPORT)
PREFILLED_SYRINGE | INTRAVENOUS | Status: DC | PRN
  Administered 2023-01-20 (×6): 160 ug via INTRAVENOUS

## 2023-01-20 SURGICAL SUPPLY — 38 items
ADH SKN CLS APL DERMABOND .7 (GAUZE/BANDAGES/DRESSINGS) ×1
APL PRP STRL LF DISP 70% ISPRP (MISCELLANEOUS) ×1
BLADE CLIPPER SENSICLIP SURGIC (BLADE) ×1 IMPLANT
BLADE SURG 15 STRL LF DISP TIS (BLADE) ×1 IMPLANT
BLADE SURG 15 STRL SS (BLADE) ×1
CHLORAPREP W/TINT 26 (MISCELLANEOUS) ×1 IMPLANT
COVER BACK TABLE 60X90IN (DRAPES) ×1 IMPLANT
COVER MAYO STAND STRL (DRAPES) ×1 IMPLANT
DERMABOND ADVANCED .7 DNX12 (GAUZE/BANDAGES/DRESSINGS) ×1 IMPLANT
DRAIN PENROSE 0.5X18 (DRAIN) IMPLANT
DRAPE LAPAROTOMY TRNSV 102X78 (DRAPES) ×1 IMPLANT
DRAPE UTILITY XL STRL (DRAPES) ×1 IMPLANT
ELECT REM PT RETURN 9FT ADLT (ELECTROSURGICAL) ×1
ELECTRODE REM PT RTRN 9FT ADLT (ELECTROSURGICAL) ×1 IMPLANT
GAUZE 4X4 16PLY ~~LOC~~+RFID DBL (SPONGE) IMPLANT
GLOVE SURG ORTHO 8.0 STRL STRW (GLOVE) ×1 IMPLANT
GLOVE SURG SS PI 7.5 STRL IVOR (GLOVE) IMPLANT
GOWN STRL REUS W/TWL LRG LVL3 (GOWN DISPOSABLE) IMPLANT
GOWN STRL REUS W/TWL XL LVL3 (GOWN DISPOSABLE) ×1 IMPLANT
KIT TURNOVER CYSTO (KITS) ×1 IMPLANT
MANIFOLD NEPTUNE II (INSTRUMENTS) IMPLANT
MESH ULTRAPRO 3X6 7.6X15CM (Mesh General) ×1 IMPLANT
NDL HYPO 25X1 1.5 SAFETY (NEEDLE) ×1 IMPLANT
NEEDLE HYPO 25X1 1.5 SAFETY (NEEDLE) ×1 IMPLANT
NS IRRIG 500ML POUR BTL (IV SOLUTION) ×1 IMPLANT
PACK BASIN DAY SURGERY FS (CUSTOM PROCEDURE TRAY) ×1 IMPLANT
PENCIL SMOKE EVACUATOR (MISCELLANEOUS) ×1 IMPLANT
SLEEVE SCD COMPRESS KNEE MED (STOCKING) ×1 IMPLANT
SPIKE FLUID TRANSFER (MISCELLANEOUS) ×1 IMPLANT
SUT MNCRL AB 4-0 PS2 18 (SUTURE) ×1 IMPLANT
SUT NOVA NAB GS-22 2 0 T19 (SUTURE) ×2 IMPLANT
SUT SILK 2 0 SH (SUTURE) ×1 IMPLANT
SUT VIC AB 3-0 SH 18 (SUTURE) ×1 IMPLANT
SYR BULB EAR ULCER 3OZ GRN STR (SYRINGE) ×1 IMPLANT
SYR CONTROL 10ML LL (SYRINGE) ×1 IMPLANT
TOWEL OR 17X24 6PK STRL BLUE (TOWEL DISPOSABLE) ×1 IMPLANT
TUBE CONNECTING 12X1/4 (SUCTIONS) ×1 IMPLANT
YANKAUER SUCT BULB TIP NO VENT (SUCTIONS) ×1 IMPLANT

## 2023-01-20 NOTE — Anesthesia Procedure Notes (Signed)
Procedure Name: Intubation Date/Time: 01/20/2023 10:23 AM  Performed by: Bishop Limbo, CRNAPre-anesthesia Checklist: Patient identified, Emergency Drugs available, Suction available and Patient being monitored Patient Re-evaluated:Patient Re-evaluated prior to induction Oxygen Delivery Method: Circle System Utilized Preoxygenation: Pre-oxygenation with 100% oxygen Induction Type: IV induction Ventilation: Mask ventilation without difficulty Laryngoscope Size: Mac and 4 Grade View: Grade I Tube type: Oral Tube size: 7.0 mm Number of attempts: 1 Airway Equipment and Method: Stylet Placement Confirmation: ETT inserted through vocal cords under direct vision, positive ETCO2 and breath sounds checked- equal and bilateral Secured at: 22 cm Tube secured with: Tape Dental Injury: Teeth and Oropharynx as per pre-operative assessment

## 2023-01-20 NOTE — Interval H&P Note (Signed)
History and Physical Interval Note:  01/20/2023 10:08 AM  [name] [name]  has presented today for surgery, with the diagnosis of LEFT INGUINAL HERNIA, REDUCIBLE.  The various methods of treatment have been discussed with the patient and family. After consideration of risks, benefits and other options for treatment, the patient has consented to    Procedure(s) with comments: LEFT HERNIA REPAIR INGUINAL REPAIR WITH MESH (Left) - 90 as a surgical intervention.    The patient's history has been reviewed, patient examined, no change in status, stable for surgery.  I have reviewed the patient's chart and labs.  Questions were answered to the patient's satisfaction.    Darnell Level, MD [facility] practice Office: [phone]   Darnell Level

## 2023-01-20 NOTE — Anesthesia Preprocedure Evaluation (Addendum)
Anesthesia Evaluation  Patient identified by MRN, date of birth, ID band Patient awake    Reviewed: Allergy & Precautions, NPO status , Patient's Chart, lab work & pertinent test results  Airway Mallampati: III  TM Distance: >3 FB Neck ROM: Full    Dental  (+) Missing,    Pulmonary former smoker   Pulmonary exam normal        Cardiovascular hypertension, Pt. on medications and Pt. on home beta blockers + CAD and + CABG  Normal cardiovascular exam+ dysrhythmias Atrial Fibrillation      Neuro/Psych  Headaches  negative psych ROS   GI/Hepatic negative GI ROS, Neg liver ROS,,,  Endo/Other  diabetes, Oral Hypoglycemic Agents    Renal/GU Renal disease     Musculoskeletal negative musculoskeletal ROS (+)    Abdominal   Peds  Hematology  (+) Blood dyscrasia (Eliquis)   Anesthesia Other Findings LEFT INGUINAL HERNIA, REDUCIBLE  Reproductive/Obstetrics                             Anesthesia Physical Anesthesia Plan  ASA: 3  Anesthesia Plan: General   Post-op Pain Management:    Induction: Intravenous  PONV Risk Score and Plan: 2 and Ondansetron, Dexamethasone, Treatment may vary due to age or medical condition and Midazolam  Airway Management Planned: Oral ETT  Additional Equipment:   Intra-op Plan:   Post-operative Plan: Extubation in OR  Informed Consent: I have reviewed the patients History and Physical, chart, labs and discussed the procedure including the risks, benefits and alternatives for the proposed anesthesia with the patient or authorized representative who has indicated his/her understanding and acceptance.     Dental advisory given  Plan Discussed with: CRNA  Anesthesia Plan Comments:        Anesthesia Quick Evaluation

## 2023-01-20 NOTE — Transfer of Care (Signed)
Immediate Anesthesia Transfer of Care Note  Patient: [name] [name]  Procedure(s) Performed: LEFT HERNIA REPAIR INGUINAL REPAIR WITH MESH (Left: Inguinal)  Patient Location: PACU  Anesthesia Type:General  Level of Consciousness: awake, alert , oriented, and patient cooperative  Airway & Oxygen Therapy: Patient Spontanous Breathing  Post-op Assessment: Report given to RN and Post -op Vital signs reviewed and stable  Post vital signs: Reviewed and stable  Last Vitals:  Vitals Value Taken Time  BP 154/91 01/20/23 1155  Temp 36.3 C 01/20/23 1155  Pulse 64 01/20/23 1157  Resp 0 01/20/23 1157  SpO2 95 % 01/20/23 1157  Vitals shown include unfiled device data.  Last Pain:  Vitals:   01/20/23 0954  TempSrc: Oral  PainSc: 1       Patients Stated Pain Goal: 5 (01/20/23 0954)  Complications: No notable events documented.

## 2023-01-20 NOTE — Discharge Instructions (Addendum)
[facility]  HERNIA REPAIR POST OP INSTRUCTIONS  Always review your discharge instruction sheet given to you by the facility where your surgery was performed.  A  prescription for pain medication may be sent to your pharmacy on discharge.  Take your pain medication as prescribed.  If narcotic pain medicine is not needed, then you may take acetaminophen (Tylenol) or ibuprofen (Advil) as needed.  Take your usually prescribed medications unless otherwise directed.  If you need a refill on your pain medication, please contact your pharmacy.  They will contact our office to request authorization. Prescriptions will not be filled after 5:00 PM daily or on weekends.  You should follow a light diet the first 24 hours after arrival home, such as soup and crackers or toast.  Be sure to include plenty of fluids daily.  Resume your normal diet the day after surgery.  Most patients will experience some swelling and bruising around the surgical site.  Ice packs and reclining will help.  Swelling and bruising can take several days to resolve.   It is common to experience some constipation if taking pain medication after surgery.  Increasing fluid intake and taking a stool softener (such as Colace) will usually help or prevent this problem from occurring.  A mild laxative (Milk of Magnesia or Miralax) should be taken according to package directions if there is no bowel movement after 48 hours.  You will likely have Dermabond (topical glue) over your incisions.  This seals the incisions and allows you to bathe and shower at any time after your surgery.  Glue should remain in place for up to 10 days.  It may be removed after 10 days by pealing off the Dermabond material or using Vaseline or naval jelly to remove.  ACTIVITIES:  You may resume regular (light) daily activities beginning the next day - such as daily self-care, walking, climbing stairs - gradually increasing activities as tolerated.  You  may have sexual intercourse when it is comfortable.  Refrain from any heavy lifting or straining until approved by your doctor.  You may drive when you are no longer taking prescription pain medication, when you can comfortably wear a seatbelt, and when you can safely maneuver your car and apply the brakes.  You should see your doctor in the office for a follow-up appointment approximately 2-3 weeks after your surgery.  Make sure that you call for this appointment within a day or two after you arrive home to insure a convenient appointment time.  WHEN TO CALL YOUR DOCTOR: Fever greater than 101.0 Inability to urinate Persistent nausea and/or vomiting Extreme swelling or bruising Continued bleeding from incision Increased pain, redness, or drainage from the incision  The clinic staff is available to answer your questions during regular business hours.  Please don't hesitate to call and ask to speak to one of the nurses for clinical concerns.  If you have a medical emergency, go to the nearest emergency room or call 911.  A surgeon from [facility] is always on call for the hospital.   [facility] [address], [address], [place], [place]  [address]  [phone] ? [phone] ? FAX [phone]   Post Anesthesia Home Care Instructions  Activity: Get plenty of rest for the remainder of the day. A responsible individual must stay with you for 24 hours following the procedure.  For the next 24 hours, DO NOT: -Drive a car -Advertising copywriter -Drink alcoholic beverages -Take any  medication unless instructed by your physician -Make any legal decisions or sign important papers.  Meals: Start with liquid foods such as gelatin or soup. Progress to regular foods as tolerated. Avoid greasy, spicy, heavy foods. If nausea and/or vomiting occur, drink only clear liquids until the nausea and/or vomiting subsides. Call your physician if vomiting  continues.  Special Instructions/Symptoms: Your throat may feel dry or sore from the anesthesia or the breathing tube placed in your throat during surgery. If this causes discomfort, gargle with warm salt water. The discomfort should disappear within 24 hours.  May begin taking Tylenol at 4 PM 01/20/2023 as needed for soreness/discomfort.  Information for Discharge Teaching: EXPAREL (bupivacaine liposome injectable suspension)   Your surgeon or anesthesiologist gave you EXPAREL(bupivacaine) to help control your pain after surgery.  EXPAREL is a local anesthetic that provides pain relief by numbing the tissue around the surgical site. EXPAREL is designed to release pain medication over time and can control pain for up to 72 hours. Depending on how you respond to EXPAREL, you may require less pain medication during your recovery.  Possible side effects: Temporary loss of sensation or ability to move in the area where bupivacaine was injected. Nausea, vomiting, constipation Rarely, numbness and tingling in your mouth or lips, lightheadedness, or anxiety may occur. Call your doctor right away if you think you may be experiencing any of these sensations, or if you have other questions regarding possible side effects.  Follow all other discharge instructions given to you by your surgeon or nurse. Eat a healthy diet and drink plenty of water or other fluids.  If you return to the hospital for any reason within 96 hours following the administration of EXPAREL, it is important for health care providers to know that you have received this anesthetic. A teal colored band has been placed on your arm with the date, time and amount of EXPAREL you have received in order to alert and inform your health care providers. Please leave this armband in place for the full 96 hours following administration, and then you may remove the band.  Do not remove green armband before Monday, January 24, 2023.

## 2023-01-20 NOTE — Op Note (Signed)
Procedure Note  Pre-operative Diagnosis:  Left inguinal hernia, reducible  Post-operative Diagnosis: same  Procedure:  Open left inguinal hernia repair with mesh  Surgeon:  Darnell Level, MD  Anesthesia:  General  Preparation:  Chlora-prep  Estimated Blood Loss: minimal  Complications:  none  Indications: The patient presented with a left, reducible hernia.  Patient is referred by his primary care physician, Dr.Alexander Althea Charon, for surgical evaluation and management of a left inguinal hernia. Patient had been doing some strenuous lifting. He also had noted some constipation. He developed left groin pain and noted a bulge in the left groin. Patient had had a previous right inguinal hernia repair performed by me approximately 6 years ago. He has had no signs or symptoms of recurrence. Patient has been taking MiraLAX to help with his bowel movements. He now presents to discuss repair of his left inguinal hernia.   Procedure Details  The patient was evaluated in the holding area. All of the patient's questions were answered and the proposed procedure was confirmed. The site of the procedure was properly marked. The patient was taken to the Operating Room, identified by name, and the procedure verified as inguinal hernia repair.  The patient was placed in the supine position and underwent induction of anesthesia. A "Time Out" was performed per routine. The lower abdomen and groin were prepped and draped in the usual aseptic fashion.  After ascertaining that an adequate level of anesthesia had been obtained, an incision was made in the groin with a #10 blade.  Dissection was carried through the subcutaneous tissues and hemostasis obtained with the electrocautery.  A Gelpi retractor was placed for exposure.  The external oblique fascia was incised in line with it's fibers and extended through the external inguinal ring.  The cord structures were dissected out of the inguinal canal and encircled  with a Penrose drain.  The floor of the inguinal canal was dissected out.  There is considerable laxity of the floor without a true defect.  The cord was explored and moderate sized hernia sac is identified.  It is dissected out and opened.  It contains sigmoid colon which is reduced.  The sac is excised and discarded.  The peritoneum is closed with a running 2-0 silk suture.  The floor of the inguinal canal was reconstructed with Ethicon Ultrapro mesh cut to the appropriate dimensions.  It was secured to the pubic tubercle with a 2-0 Novafil suture and along the inguinal ligament with a running 2-0 Novafil suture.  Mesh was split to accommodate the cord structures.  The superior margin of the mesh was secured to the transversalis and internal oblique musculature with interrupted 2-0 Novafil sutures.  The tails of the mesh were overlapped lateral to the cord structures and secured to the inguinal ligament with interrupted 2-0 Novafil sutures to recreate the internal inguinal ring.  Cord structures were returned to the inguinal canal.  Local anesthetic using a mixture of Exparel and Marcaine was infiltrated throughout the field.  External oblique fascia was closed with interrupted 3-0 Vicryl sutures.  Subcutaneous tissues were closed with interrupted 3-0 Vicryl sutures.  Skin was anesthetized with local anesthetic using a mixture of Exparel and Marcaine, and the skin edges were re-approximated with a running 4-0 Monocryl suture.  Wound was washed and dried and Dermabond was applied.  Instrument, sponge, and needle counts were correct prior to closure and at the conclusion of the case.  The patient tolerated the procedure well.  The patient was  awakened from anesthesia and brought to the recovery room in stable condition.  Darnell Level, MD [facility] Office: [phone]

## 2023-01-20 NOTE — Interval H&P Note (Signed)
History and Physical Interval Note:  01/20/2023 9:40 AM  [name] [name]  has presented today for surgery, with the diagnosis of LEFT INGUINAL HERNIA, REDUCIBLE.  The various methods of treatment have been discussed with the patient and family. After consideration of risks, benefits and other options for treatment, the patient has consented to    Procedure(s) with comments: LEFT HERNIA REPAIR INGUINAL REPAIR WITH MESH (Left) - 90 as a surgical intervention.    The patient's history has been reviewed, patient examined, no change in status, stable for surgery.  I have reviewed the patient's chart and labs.  Questions were answered to the patient's satisfaction.    Darnell Level, MD [facility] practice Office: [phone]   Darnell Level

## 2023-01-21 ENCOUNTER — Encounter (HOSPITAL_BASED_OUTPATIENT_CLINIC_OR_DEPARTMENT_OTHER): Payer: Self-pay | Admitting: Surgery

## 2023-01-21 NOTE — Anesthesia Postprocedure Evaluation (Signed)
Anesthesia Post Note  Patient: [name] [name]  Procedure(s) Performed: LEFT HERNIA REPAIR INGUINAL REPAIR WITH MESH (Left: Inguinal)     Patient location during evaluation: PACU Anesthesia Type: General Level of consciousness: awake Pain management: pain level controlled Vital Signs Assessment: post-procedure vital signs reviewed and stable Respiratory status: spontaneous breathing, nonlabored ventilation and respiratory function stable Cardiovascular status: blood pressure returned to baseline and stable Postop Assessment: no apparent nausea or vomiting Anesthetic complications: no   No notable events documented.  Last Vitals:  Vitals:   01/20/23 1230 01/20/23 1300  BP: 121/82 (!) 119/93  Pulse: 79 77  Resp: 12 16  Temp:  36.6 C  SpO2: 96% 97%    Last Pain:  Vitals:   01/20/23 1241  TempSrc:   PainSc: 7                   P 

## 2023-01-27 ENCOUNTER — Ambulatory Visit: Payer: Medicare HMO | Admitting: Family Medicine

## 2023-02-09 ENCOUNTER — Other Ambulatory Visit: Payer: Self-pay | Admitting: Family Medicine

## 2023-02-09 ENCOUNTER — Ambulatory Visit (INDEPENDENT_AMBULATORY_CARE_PROVIDER_SITE_OTHER): Payer: Medicare HMO | Admitting: Family Medicine

## 2023-02-09 ENCOUNTER — Encounter: Payer: Self-pay | Admitting: Family Medicine

## 2023-02-09 VITALS — BP 114/76 | Ht 76.0 in | Wt 219.0 lb

## 2023-02-09 DIAGNOSIS — E1121 Type 2 diabetes mellitus with diabetic nephropathy: Secondary | ICD-10-CM

## 2023-02-09 DIAGNOSIS — E785 Hyperlipidemia, unspecified: Secondary | ICD-10-CM

## 2023-02-09 DIAGNOSIS — Z0000 Encounter for general adult medical examination without abnormal findings: Secondary | ICD-10-CM

## 2023-02-09 DIAGNOSIS — N182 Chronic kidney disease, stage 2 (mild): Secondary | ICD-10-CM

## 2023-02-09 DIAGNOSIS — R351 Nocturia: Secondary | ICD-10-CM

## 2023-02-09 DIAGNOSIS — I129 Hypertensive chronic kidney disease with stage 1 through stage 4 chronic kidney disease, or unspecified chronic kidney disease: Secondary | ICD-10-CM | POA: Diagnosis not present

## 2023-02-09 DIAGNOSIS — I25118 Atherosclerotic heart disease of native coronary artery with other forms of angina pectoris: Secondary | ICD-10-CM

## 2023-02-09 LAB — POCT GLYCOSYLATED HEMOGLOBIN (HGB A1C): Hemoglobin A1C: 8.1 % — AB (ref 4.0–5.6)

## 2023-02-09 NOTE — Progress Notes (Signed)
Subjective:    Patient ID: [name] [name], male    DOB: 1951/03/11, 72 y.o.   MRN: [id number]  [name] [name] is a 72 y.o. male presenting on 02/09/2023 for Medical Management of Chronic Issues   HPI  S/p Left Inguinal Hernia Repair, surgery by Dr Gerrit Friends - 01/20/23 He is doing well, and healing still.  Type 2 Diabetes, with Nephropathy CKD III Overweight BMI >26 See prior notes for background information. - Today doing well. - Weight down 13 lbs in past 2-3 months Due for A1c today CBGs: 150-170 avg Meds: Metformin 1000mg  BID - tolerating well without side effects Currently on ACEi Lifestyle: - Diet (improving diabetic diet, add green tea, adding more water to daily diet,  - Exercise (improved exercise outdoors, yard work he is tolerating exertion even better, stationary bike) - Father with diabetes Recently updated DM Eye Exam. Dr Clydene Pugh 11/06/21 Denies hypoglycemia, visual changes, numbness or tingling, polyuria    CHRONIC HTN: Reports no new concerns. Initial elevated, but repeat has normalized. Follows with Cardiology He still has chronic bradycardia. Current Meds - Lisinopril 10mg  daily, Imdur 30mg  daily, Metoprolol 50mg  BID Reports good compliance, took meds today. Tolerating well, w/o complaints. Denies CP, dyspnea, HA, edema, dizziness / lightheadedness   Osteoarthritis multiple joints Reports has episodic days of stiffness and soreness hips and other joints. Taking OTC for it with relief. Right groin pain, worse with laying down. Improved with walking. Feels like it is his hip. Does not feel like hernia Not interested in X-ray Tried Tylenol AS NEEDED   Constipation Improved s/p hernia repair   Health Maintenance:  Last Colonoscopy 12/23/22 - [facility] Dr Matthias Hughs, 2 polyps     11/25/2022    8:12 AM 09/27/2022    8:59 AM 01/25/2022    9:50 AM  Depression screen PHQ 2/9  Decreased Interest 0 0 0  Down, Depressed, Hopeless 1 0 0  PHQ - 2 Score 1 0 0   Altered sleeping 0 0 0  Tired, decreased energy 1 0 0  Change in appetite 0 0 0  Feeling bad or failure about yourself  0 0 0  Trouble concentrating 0 0 0  Moving slowly or fidgety/restless 0 0 0  Suicidal thoughts 0 0 0  PHQ-9 Score 2 0 0  Difficult doing work/chores Not difficult at all  Not difficult at all    Social History   Tobacco Use   Smoking status: Former    Types: Cigarettes   Smokeless tobacco: Never  Vaping Use   Vaping status: Never Used  Substance Use Topics   Alcohol use: Not Currently   Drug use: No    Review of Systems Per HPI unless specifically indicated above     Objective:    BP 114/76 (BP Location: Left Arm, Cuff Size: Normal)   Ht 6\' 4"  (1.93 m)   Wt 219 lb (99.3 kg)   BMI 26.66 kg/m   Wt Readings from Last 3 Encounters:  02/09/23 219 lb (99.3 kg)  01/20/23 224 lb 8 oz (101.8 kg)  11/25/22 232 lb (105.2 kg)    Physical Exam Vitals and nursing note reviewed.  Constitutional:      General: He is not in acute distress.    Appearance: Normal appearance. He is well-developed. He is not diaphoretic.     Comments: Well-appearing, comfortable, cooperative  HENT:     Head: Normocephalic and atraumatic.  Eyes:     General:  Right eye: No discharge.        Left eye: No discharge.     Conjunctiva/sclera: Conjunctivae normal.  Cardiovascular:     Rate and Rhythm: Normal rate.  Pulmonary:     Effort: Pulmonary effort is normal.  Skin:    General: Skin is warm and dry.     Findings: No erythema or rash.  Neurological:     Mental Status: He is alert and oriented to person, place, and time.  Psychiatric:        Mood and Affect: Mood normal.        Behavior: Behavior normal.        Thought Content: Thought content normal.     Comments: Well groomed, good eye contact, normal speech and thoughts       Results for orders placed or performed in visit on 02/09/23  POCT HgB A1C  Result Value Ref Range   Hemoglobin A1C 8.1 (A) 4.0 -  5.6 %   HbA1c POC (<> result, manual entry)     HbA1c, POC (prediabetic range)     HbA1c, POC (controlled diabetic range)        Assessment & Plan:   Problem List Items Addressed This Visit     Benign hypertension with CKD (chronic kidney disease), stage II    Controlled HYPERTENSION, repeat manual BP improved Complicated by CAD, CKD-II   Plan:  1. Continue current BP regimen - Metoprolol 25mg  BID, Lisiniopril 10mg  daily, Isosorbide mononite 2. Encourage improved lifestyle - low sodium diet, regular exercise 3. Continue monitor BP outside office, bring readings to next visit, if persistently >140/90 or new symptoms notify office sooner      Type 2 diabetes mellitus with diabetic nephropathy (HCC) - Primary    Improved A1c to 8.1 Complications - CKD-II, hyperlipidemia, CAD, obesity - increases risk of future cardiovascular complications   Plan:  1. Continue Metformin to 1000 BID - Future consider injectable GLP1 meds, due to CKD limit other options  2. Encourage improved lifestyle - low carb, low sugar diet, reduce portion size, continue improving and start more regular exercise 3. Check CBG, bring log to next visit for review 4. Continue ASA, ACEi, Statin      Relevant Orders   POCT HgB A1C (Completed)   Urine microalbumin-creatinine with uACR    No orders of the defined types were placed in this encounter.    Follow up plan: Return in about 8 months (around 10/10/2023) for 8 month fasting lab only then 1 week later Annual Physical.  Future labs ordered for 10/10/23   Saralyn Pilar, DO [facility] 02/09/2023, 8:12 AM

## 2023-02-09 NOTE — Patient Instructions (Addendum)
Thank you for coming to the office today.  Recent Labs    09/20/22 0756 02/09/23 0821  HGBA1C 8.5* 8.1*   Good work overall on the blood sugar.  Glad you are recovering well.  BP improved on repeat check.  DUE for FASTING BLOOD WORK (no food or drink after midnight before the lab appointment, only water or coffee without cream/sugar on the morning of)  SCHEDULE "Lab Only" visit in the morning at the clinic for lab draw in 8 MONTHS   - Make sure Lab Only appointment is at about 1 week before your next appointment, so that results will be available  For Lab Results, once available within 2-3 days of blood draw, you can can log in to MyChart online to view your results and a brief explanation. Also, we can discuss results at next follow-up visit.   Please schedule a Follow-up Appointment to: Return in about 8 months (around 10/10/2023) for 8 month fasting lab only then 1 week later Annual Physical.  If you have any other questions or concerns, please feel free to call the office or send a message through MyChart. You may also schedule an earlier appointment if necessary.  Additionally, you may be receiving a survey about your experience at our office within a few days to 1 week by e-mail or mail. We value your feedback.  Saralyn Pilar, DO [facility], [place]

## 2023-02-09 NOTE — Assessment & Plan Note (Signed)
Improved A1c to 8.1 Complications - CKD-II, hyperlipidemia, CAD, obesity - increases risk of future cardiovascular complications   Plan:  1. Continue Metformin to 1000 BID - Future consider injectable GLP1 meds, due to CKD limit other options  2. Encourage improved lifestyle - low carb, low sugar diet, reduce portion size, continue improving and start more regular exercise 3. Check CBG, bring log to next visit for review 4. Continue ASA, ACEi, Statin

## 2023-02-09 NOTE — Assessment & Plan Note (Signed)
Controlled HYPERTENSION, repeat manual BP improved Complicated by CAD, CKD-II   Plan:  1. Continue current BP regimen - Metoprolol 25mg  BID, Lisiniopril 10mg  daily, Isosorbide mononite 2. Encourage improved lifestyle - low sodium diet, regular exercise 3. Continue monitor BP outside office, bring readings to next visit, if persistently >140/90 or new symptoms notify office sooner

## 2023-02-10 LAB — MICROALBUMIN / CREATININE URINE RATIO
Creatinine, Urine: 159 mg/dL (ref 20–320)
Microalb Creat Ratio: 11 mg/g{creat} (ref <30)
Microalb, Ur: 1.7 mg/dL

## 2023-02-28 ENCOUNTER — Telehealth: Payer: Self-pay | Admitting: Family Medicine

## 2023-02-28 NOTE — Telephone Encounter (Signed)
LM 02/28/2023 to schedule AWV   [name] [name]; Care Guide Ambulatory Clinical Support [place] [facility] Direct Dial: [phone]

## 2023-06-27 ENCOUNTER — Other Ambulatory Visit: Payer: Self-pay | Admitting: Internal Medicine

## 2023-07-11 ENCOUNTER — Other Ambulatory Visit: Payer: Self-pay | Admitting: Internal Medicine

## 2023-07-11 DIAGNOSIS — I4819 Other persistent atrial fibrillation: Secondary | ICD-10-CM

## 2023-07-11 NOTE — Telephone Encounter (Signed)
Prescription refill request for Eliquis received. Indication:afib Last office visit:2/24 Scr:1.20  8/24 Age: 73 Weight:99.3  kg  Prescription refilled

## 2023-07-11 NOTE — Telephone Encounter (Signed)
Refill request

## 2023-07-23 ENCOUNTER — Other Ambulatory Visit: Payer: Self-pay | Admitting: Internal Medicine

## 2023-08-01 ENCOUNTER — Other Ambulatory Visit: Payer: Self-pay | Admitting: Internal Medicine

## 2023-08-02 ENCOUNTER — Other Ambulatory Visit: Payer: Self-pay

## 2023-08-02 ENCOUNTER — Telehealth: Payer: Self-pay | Admitting: Internal Medicine

## 2023-08-02 DIAGNOSIS — I4819 Other persistent atrial fibrillation: Secondary | ICD-10-CM

## 2023-08-02 MED ORDER — METOPROLOL TARTRATE 25 MG PO TABS: 25.0000 mg | ORAL_TABLET | Freq: Two times a day (BID) | ORAL | 0 refills | Status: AC

## 2023-08-02 MED ORDER — ATORVASTATIN CALCIUM 80 MG PO TABS: 80.0000 mg | ORAL_TABLET | Freq: Every day | ORAL | 0 refills | Status: AC

## 2023-08-02 MED ORDER — LISINOPRIL 10 MG PO TABS: 10.0000 mg | ORAL_TABLET | Freq: Every day | ORAL | 0 refills | Status: DC

## 2023-08-02 MED ORDER — ISOSORBIDE MONONITRATE ER 30 MG PO TB24: 30.0000 mg | ORAL_TABLET | Freq: Every day | ORAL | 0 refills | Status: DC

## 2023-08-02 MED ORDER — APIXABAN 5 MG PO TABS: 5.0000 mg | ORAL_TABLET | Freq: Two times a day (BID) | ORAL | 1 refills | Status: DC

## 2023-08-02 NOTE — Telephone Encounter (Signed)
Disp Refills Start End   isosorbide mononitrate (IMDUR) 30 MG 24 hr tablet 90 tablet 0 06/28/2023 --   Sig - Route: TAKE 1 TABLET BY MOUTH ONCE DAILY - Oral   Sent to pharmacy as: isosorbide mononitrate (IMDUR) 30 MG 24 hr tablet   E-Prescribing Status: Receipt confirmed by pharmacy (06/28/2023  9:56 AM EST)

## 2023-08-02 NOTE — Telephone Encounter (Signed)
 Refill Request (Eliquis)

## 2023-08-02 NOTE — Telephone Encounter (Signed)
 *  STAT* If patient is at the pharmacy, call can be transferred to refill team.   1. Which medications need to be refilled? (please list name of each medication and dose if known)   atorvastatin (LIPITOR) 80 MG tablet  ELIQUIS 5 MG TABS tablet  isosorbide mononitrate (IMDUR) 30 MG 24 hr tablet  lisinopril (ZESTRIL) 10 MG tablet  metoprolol tartrate (LOPRESSOR) 25 MG tablet    2. Which pharmacy/location (including street and city if local pharmacy) is medication to be sent to?   NEW PHARMACY ALERT: [facility] - [place], [place] - [address]   New pharmacy requesting new scripts for all medications   3. Do they need a 30 day or 90 day supply?  90 day

## 2023-08-02 NOTE — Telephone Encounter (Signed)
Disp Refills Start End   atorvastatin (LIPITOR) 80 MG tablet 90 tablet 3 07/25/2023 --   Sig - Route: TAKE 1 TABLET BY MOUTH EVERY DAY - Oral   Sent to pharmacy as: atorvastatin (LIPITOR) 80 MG tablet   E-Prescribing Status: Receipt confirmed by pharmacy (07/25/2023 12:29 PM EST)

## 2023-08-02 NOTE — Telephone Encounter (Signed)
Prescription refill request for Eliquis received. Indication:afib Last office visit:7/24 Scr:1.20  8/24 Age: 73 Weight:99.3  kg  Prescription refilled

## 2023-08-02 NOTE — Telephone Encounter (Signed)
*  STAT* If patient is at the pharmacy, call can be transferred to refill team.   1. Which medications need to be refilled? (please list name of each medication and dose if known) need new prescription for Lisinopril - changing pharmacy   2. Would you like to learn more about the convenience, safety, & potential cost savings by using the [facility]?     3. Are you open to using the [facility] (Type [facility].   4. Which pharmacy/location (including street and city if local pharmacy) is medication to be sent to? [facility] [place] [phone]   5. Do they need a 30 day or 90 day supply? 90 days and refills

## 2023-08-02 NOTE — Telephone Encounter (Signed)
Requested Prescriptions   Signed Prescriptions Disp Refills   atorvastatin (LIPITOR) 80 MG tablet 90 tablet 0    Sig: Take 1 tablet (80 mg total) by mouth daily.    Authorizing Provider: END, CHRISTOPHER    Ordering User: Shawnie Dapper, Charlis Harner C   isosorbide mononitrate (IMDUR) 30 MG 24 hr tablet 90 tablet 0    Sig: Take 1 tablet (30 mg total) by mouth daily.    Authorizing Provider: END, CHRISTOPHER    Ordering User: Debroh Sieloff C   lisinopril (ZESTRIL) 10 MG tablet 90 tablet 0    Sig: Take 1 tablet (10 mg total) by mouth daily.    Authorizing Provider: END, CHRISTOPHER    Ordering User: Whitaker Holderman C   metoprolol tartrate (LOPRESSOR) 25 MG tablet 180 tablet 0    Sig: Take 1 tablet (25 mg total) by mouth 2 (two) times daily.    Authorizing Provider: END, CHRISTOPHER    Ordering User: Kendrick Fries

## 2023-08-10 ENCOUNTER — Encounter: Payer: Self-pay | Admitting: Internal Medicine

## 2023-08-10 ENCOUNTER — Ambulatory Visit: Payer: Medicare Other | Attending: Internal Medicine | Admitting: Internal Medicine

## 2023-08-10 VITALS — BP 130/80 | HR 75 | Ht 76.0 in | Wt 236.1 lb

## 2023-08-10 DIAGNOSIS — I25118 Atherosclerotic heart disease of native coronary artery with other forms of angina pectoris: Secondary | ICD-10-CM

## 2023-08-10 DIAGNOSIS — I4821 Permanent atrial fibrillation: Secondary | ICD-10-CM | POA: Diagnosis not present

## 2023-08-10 DIAGNOSIS — I10 Essential (primary) hypertension: Secondary | ICD-10-CM

## 2023-08-10 DIAGNOSIS — E785 Hyperlipidemia, unspecified: Secondary | ICD-10-CM

## 2023-08-10 DIAGNOSIS — E1169 Type 2 diabetes mellitus with other specified complication: Secondary | ICD-10-CM | POA: Diagnosis not present

## 2023-08-10 NOTE — Patient Instructions (Signed)
 Medication Instructions:  Your physician recommends that you continue on your current medications as directed. Please refer to the Current Medication list given to you today.   *If you need a refill on your cardiac medications before your next appointment, please call your pharmacy*   Lab Work: No labs ordered today    Testing/Procedures: No test ordered today    Follow-Up: At [facility], you and your health needs are our priority.  As part of our continuing mission to provide you with exceptional heart care, we have created designated Provider Care Teams.  These Care Teams include your primary Cardiologist (physician) and Advanced Practice Providers (APPs -  Physician Assistants and Nurse Practitioners) who all work together to provide you with the care you need, when you need it.  We recommend signing up for the patient portal called "MyChart".  Sign up information is provided on this After Visit Summary.  MyChart is used to connect with patients for Virtual Visits (Telemedicine).  Patients are able to view lab/test results, encounter notes, upcoming appointments, etc.  Non-urgent messages can be sent to your provider as well.   To learn more about what you can do with MyChart, go to [email or web address].    Your next appointment:   1 year(s)  Provider:   You may see [name], MD or one of the following Advanced Practice Providers on your designated Care Team:   Nicolasa Ducking, NP Eula Listen, PA-C Cadence Fransico Michael, PA-C Charlsie Quest, NP Carlos Levering, NP

## 2023-08-10 NOTE — Progress Notes (Unsigned)
  Cardiology Office Note:  .   Date:  08/11/2023  ID:  [name] [name], DOB [birth year], MRN [id number] PCP: Smitty Cords, DO  [facility] Providers Cardiologist:  [name], MD Electrophysiologist:  Lanier Prude, MD     History of Present Illness: .   [name] [name] is a 73 y.o. male with history of coronary artery disease status post CABG (2013), permanent atrial fibrillation, hypertension, hyperlipidemia, type 2 diabetes mellitus, who presents for follow-up of coronary artery disease and atrial fibrillation.  I last saw him a year ago, which time he was feeling well from a heart standpoint.  We did not make any medication changes or pursue additional testing.  Today, [name] [name] reports that he continues to feel well, denying chest pain, shortness of breath, palpitations, lightheadedness, and edema.  He is tolerating his medications well.  He has not had any significant bleeding, remaining on apixaban.  He underwent successful inguinal hernia repair since our last visit.  ROS: See HPI  Studies Reviewed: Marland Kitchen   EKG Interpretation Date/Time:  Wednesday August 10 2023 16:11:55 EST Ventricular Rate:  75 PR Interval:    QRS Duration:  126 QT Interval:  396 QTC Calculation: 442 R Axis:   -53  Text Interpretation: Atrial fibrillation Left axis deviation Non-specific intra-ventricular conduction block Minimal voltage criteria for LVH, may be normal variant ( Cornell product ) T wave abnormality, consider lateral ischemia When compared with ECG of 05-Aug-2022 No significant change was found Confirmed by Ferrah Panagopoulos, [name] [phone]) on 08/11/2023 7:55:43 AM    Risk Assessment/Calculations:    CHA2DS2-VASc Score = 4   This indicates a 4.8% annual risk of stroke. The patient's score is based upon: CHF History: 0 HTN History: 1 Diabetes History: 1 Stroke History: 0 Vascular Disease History: 1 Age Score: 1 Gender Score: 0        Physical Exam:   VS:  BP 130/80 (BP  Location: Left Arm, Patient Position: Sitting, Cuff Size: Normal)   Pulse 75   Ht 6\' 4"  (1.93 m)   Wt 236 lb 2 oz (107.1 kg)   SpO2 97%   BMI 28.74 kg/m    Wt Readings from Last 3 Encounters:  08/10/23 236 lb 2 oz (107.1 kg)  02/09/23 219 lb (99.3 kg)  01/20/23 224 lb 8 oz (101.8 kg)    General:  NAD. Neck: No JVD or HJR. Lungs: Clear to auscultation bilaterally without wheezes or crackles. Heart: Irregularly irregular rhythm without murmurs. Abdomen: Soft, nontender, nondistended. Extremities: Trace pretibial edema.  ASSESSMENT AND PLAN: .    Permanent atrial fibrillation: Scram remains asymptomatic with reasonable ventricular rate control.  Continue current regimen of apixaban and metoprolol.  Coronary artery disease with stable angina and hyperlipidemia associated with type 2 diabetes mellitus: [name] [name] has not had any chest pain or dyspnea.  We will continue his current antianginal regimen of metoprolol and isosorbide mononitrate.  Continue aggressive secondary prevention with atorvastatin as well.  Lipids were at goal on last check in 09/2022 with reassessment planned at his upcoming visit with Dr. Althea Charon.  Hypertension: Blood pressure borderline elevated today.  Continue current regimen of isosorbide mononitrate, lisinopril, and metoprolol succinate with target blood pressure less than 130/80.    Dispo: Return to clinic in 1 year.  Signed, [name], MD

## 2023-08-11 ENCOUNTER — Encounter: Payer: Self-pay | Admitting: Internal Medicine

## 2023-09-13 DIAGNOSIS — K080 Exfoliation of teeth due to systemic causes: Secondary | ICD-10-CM | POA: Diagnosis not present

## 2023-09-19 ENCOUNTER — Telehealth: Payer: Self-pay | Admitting: Family Medicine

## 2023-09-19 NOTE — Telephone Encounter (Signed)
 We received a paper copy of this patient's [employer] from [facility], date of [employer] October 11, 2023 upcoming.  He is requesting medical exemption.  As far as I know, any patient age 72 and older can be exempt based on age, I have seen several of these Jurty Duty Letters, it is very common for patients age 32 and nearly 55 to receive a Programmer, multimedia.  I looked through his medical record and I don't see a particular diagnosis that will guarantee he is medically excused from serving on jury duty.  I tried calling him but did not reach him.  Would you be able to try again to reach the patient and find more about specific medical condition and specific symptoms that would prevent him from participating on jury duty?  I always tell patients that we can try to provide this medical exemption letter, but ultimately it is up to the court to decide if the medical excuse is honored or not. Some medical conditions they don't always allow the exemption. But I am willing to try to write the letter for him if he can tell us specifically the reasons why he cannot participate.  Thank you!  Saralyn Pilar, DO [facility] 09/19/2023, 6:38 PM

## 2023-09-20 DIAGNOSIS — K080 Exfoliation of teeth due to systemic causes: Secondary | ICD-10-CM | POA: Diagnosis not present

## 2023-09-20 NOTE — Telephone Encounter (Signed)
 Will defer to PCP upon his return. ?

## 2023-09-20 NOTE — Telephone Encounter (Signed)
 Spoke with patients wife, she stated patient has a really hard time hearing. She feels he would not be able to hear what is being said.

## 2023-09-22 DIAGNOSIS — Z0279 Encounter for issue of other medical certificate: Secondary | ICD-10-CM

## 2023-09-22 NOTE — Telephone Encounter (Signed)
 Yes. Please notify them that the letter is already written. It is ready for pick-up anytime.  Saralyn Pilar, DO [facility] 09/22/2023, 1:50 PM

## 2023-09-22 NOTE — Telephone Encounter (Signed)
 Patients wife notified letter is ready to be picked up. She will stop by and get this.

## 2023-09-22 NOTE — Telephone Encounter (Signed)
 Patient spouse called to see if provider was going to write the letter to excuse patient from jury duty due to hard of hearing. Advised information was forwarded to Dr Kirtland Bouchard and upon his return he would take a look at it. Spouse stated it had to be in by April 20th

## 2023-10-10 ENCOUNTER — Other Ambulatory Visit: Payer: Self-pay

## 2023-10-10 DIAGNOSIS — E785 Hyperlipidemia, unspecified: Secondary | ICD-10-CM

## 2023-10-10 DIAGNOSIS — E1121 Type 2 diabetes mellitus with diabetic nephropathy: Secondary | ICD-10-CM

## 2023-10-10 DIAGNOSIS — E1169 Type 2 diabetes mellitus with other specified complication: Secondary | ICD-10-CM | POA: Diagnosis not present

## 2023-10-10 DIAGNOSIS — I129 Hypertensive chronic kidney disease with stage 1 through stage 4 chronic kidney disease, or unspecified chronic kidney disease: Secondary | ICD-10-CM

## 2023-10-10 DIAGNOSIS — I25118 Atherosclerotic heart disease of native coronary artery with other forms of angina pectoris: Secondary | ICD-10-CM

## 2023-10-10 DIAGNOSIS — R351 Nocturia: Secondary | ICD-10-CM | POA: Diagnosis not present

## 2023-10-10 DIAGNOSIS — Z0000 Encounter for general adult medical examination without abnormal findings: Secondary | ICD-10-CM | POA: Diagnosis not present

## 2023-10-11 LAB — CBC WITH DIFFERENTIAL/PLATELET
Absolute Lymphocytes: 1575 {cells}/uL (ref 850–3900)
Absolute Monocytes: 472 {cells}/uL (ref 200–950)
Basophils Absolute: 41 {cells}/uL (ref 0–200)
Basophils Relative: 0.7 %
Eosinophils Absolute: 189 {cells}/uL (ref 15–500)
Eosinophils Relative: 3.2 %
HCT: 42.2 % (ref 38.5–50.0)
Hemoglobin: 13.9 g/dL (ref 13.2–17.1)
MCH: 30.4 pg (ref 27.0–33.0)
MCHC: 32.9 g/dL (ref 32.0–36.0)
MCV: 92.3 fL (ref 80.0–100.0)
MPV: 11.9 fL (ref 7.5–12.5)
Monocytes Relative: 8 %
Neutro Abs: 3623 {cells}/uL (ref 1500–7800)
Neutrophils Relative %: 61.4 %
Platelets: 201 10*3/uL (ref 140–400)
RBC: 4.57 10*6/uL (ref 4.20–5.80)
RDW: 12.5 % (ref 11.0–15.0)
Total Lymphocyte: 26.7 %
WBC: 5.9 10*3/uL (ref 3.8–10.8)

## 2023-10-11 LAB — TSH: TSH: 2.7 m[IU]/L (ref 0.40–4.50)

## 2023-10-11 LAB — COMPLETE METABOLIC PANEL WITHOUT GFR
AG Ratio: 1.6 (calc) (ref 1.0–2.5)
ALT: 22 U/L (ref 9–46)
AST: 19 U/L (ref 10–35)
Albumin: 4.6 g/dL (ref 3.6–5.1)
Alkaline phosphatase (APISO): 78 U/L (ref 35–144)
BUN: 21 mg/dL (ref 7–25)
CO2: 31 mmol/L (ref 20–32)
Calcium: 10.2 mg/dL (ref 8.6–10.3)
Chloride: 101 mmol/L (ref 98–110)
Creat: 1.26 mg/dL (ref 0.70–1.28)
Globulin: 2.8 g/dL (ref 1.9–3.7)
Glucose, Bld: 170 mg/dL — ABNORMAL HIGH (ref 65–99)
Potassium: 5.1 mmol/L (ref 3.5–5.3)
Sodium: 139 mmol/L (ref 135–146)
Total Bilirubin: 0.9 mg/dL (ref 0.2–1.2)
Total Protein: 7.4 g/dL (ref 6.1–8.1)

## 2023-10-11 LAB — LIPID PANEL
Cholesterol: 94 mg/dL (ref <200)
HDL: 38 mg/dL — ABNORMAL LOW (ref >=40)
LDL Cholesterol (Calc): 42 mg/dL
Non-HDL Cholesterol (Calc): 56 mg/dL (ref <130)
Total CHOL/HDL Ratio: 2.5 (calc) (ref <5.0)
Triglycerides: 63 mg/dL (ref <150)

## 2023-10-11 LAB — HEMOGLOBIN A1C
Hgb A1c MFr Bld: 8 % — ABNORMAL HIGH (ref <5.7)
Mean Plasma Glucose: 183 mg/dL
eAG (mmol/L): 10.1 mmol/L

## 2023-10-11 LAB — PSA: PSA: 1.84 ng/mL (ref <=4.00)

## 2023-10-17 ENCOUNTER — Encounter: Payer: Self-pay | Admitting: Family Medicine

## 2023-10-17 ENCOUNTER — Ambulatory Visit (INDEPENDENT_AMBULATORY_CARE_PROVIDER_SITE_OTHER): Payer: Self-pay | Admitting: Family Medicine

## 2023-10-17 VITALS — BP 122/68 | HR 64 | Ht 76.0 in | Wt 231.1 lb

## 2023-10-17 DIAGNOSIS — Z7984 Long term (current) use of oral hypoglycemic drugs: Secondary | ICD-10-CM

## 2023-10-17 DIAGNOSIS — I25118 Atherosclerotic heart disease of native coronary artery with other forms of angina pectoris: Secondary | ICD-10-CM

## 2023-10-17 DIAGNOSIS — E1169 Type 2 diabetes mellitus with other specified complication: Secondary | ICD-10-CM

## 2023-10-17 DIAGNOSIS — E1121 Type 2 diabetes mellitus with diabetic nephropathy: Secondary | ICD-10-CM | POA: Diagnosis not present

## 2023-10-17 DIAGNOSIS — N182 Chronic kidney disease, stage 2 (mild): Secondary | ICD-10-CM

## 2023-10-17 DIAGNOSIS — E785 Hyperlipidemia, unspecified: Secondary | ICD-10-CM

## 2023-10-17 DIAGNOSIS — Z0000 Encounter for general adult medical examination without abnormal findings: Secondary | ICD-10-CM | POA: Diagnosis not present

## 2023-10-17 DIAGNOSIS — I129 Hypertensive chronic kidney disease with stage 1 through stage 4 chronic kidney disease, or unspecified chronic kidney disease: Secondary | ICD-10-CM

## 2023-10-17 NOTE — Progress Notes (Unsigned)
 Subjective:    Patient ID: [name] [name], male    DOB: 1951/04/11, 73 y.o.   MRN: [id number]  [name] [name] is a 73 y.o. male presenting on 10/17/2023 for Annual Exam and Diabetes   HPI  Discussed the use of AI scribe software for clinical note transcription with the patient, who gave verbal consent to proceed.  History of Present Illness   [name] [name] is a 73 year old male who presents for an annual physical exam.  He had a fall recently, landing on his hip and elbow, which resulted in tenderness in the elbow area. He did not hit his head and reports full range of motion despite some soreness.    Type 2 Diabetes, with Nephropathy CKD III Overweight BMI >28 See prior notes for background information.  He has diabetes and prefers to check his blood sugar before meals. His recent blood sugar was 171 mg/dL, and he is having difficulty using his new glucose meter, which he describes as being 'like a computer'. His A1c has improved from 8.5% a year ago to 8.0% currently. He is currently taking metformin  for his diabetes.  CBGs: 150-170 avg Meds: Metformin  1000mg  BID - tolerating well without side effects Currently on ACEi Lifestyle: - Diet (improving diabetic diet, add green tea, adding more water to daily diet,  - Exercise (improved exercise outdoors, yard work he is tolerating exertion even better, stationary bike) - Father with diabetes Recently updated DM Eye Exam. Dr Alto Atta 11/06/21 Denies hypoglycemia, visual changes, numbness or tingling, polyuria    CHRONIC HTN: Reports no new concerns. Initial elevated, but repeat has normalized. Follows with Cardiology He still has chronic bradycardia. Current Meds - Lisinopril  10mg  daily, Imdur  30mg  daily, Metoprolol  50mg  BID Reports good compliance, took meds today. Tolerating well, w/o complaints. Denies CP, dyspnea, HA, edema, dizziness / lightheadedness   Osteoarthritis multiple joints Reports has episodic days of stiffness  and soreness hips and other joints. Taking OTC for it with relief. Right groin pain, worse with laying down. Improved with walking. Feels like it is his hip. Does not feel like hernia Not interested in X-ray Tried Tylenol  AS NEEDED  HYPERLIPIDEMIA: - Reports no concerns. Last lipid panel 09/2023, LDL 42 - Currently taking Atorvastatin  80mg , tolerating well without side effects or myalgias Followed by Cardiology   Varicose Veins He has a history of varicose veins, which do not cause him pain or swelling. He notes a family history of similar conditions, as his mother and sister also had varicose veins. No swelling in his feet and no pain from his varicose veins.    Health Maintenance:   Last Colonoscopy 12/23/22 - [facility] Dr Dellis Fermo, 2 polyps  PSA 1.84 09/2023, prior 1.92     10/17/2023    8:50 AM 11/25/2022    8:12 AM 09/27/2022    8:59 AM  Depression screen PHQ 2/9  Decreased Interest 0 0 0  Down, Depressed, Hopeless 0 1 0  PHQ - 2 Score 0 1 0  Altered sleeping  0 0  Tired, decreased energy  1 0  Change in appetite  0 0  Feeling bad or failure about yourself   0 0  Trouble concentrating  0 0  Moving slowly or fidgety/restless  0 0  Suicidal thoughts  0 0  PHQ-9 Score  2 0  Difficult doing work/chores  Not difficult at all        10/17/2023    8:50 AM 11/25/2022  8:14 AM 09/27/2022    9:00 AM 01/25/2022    9:50 AM  GAD 7 : Generalized Anxiety Score  Nervous, Anxious, on Edge 0 0 0 0  Control/stop worrying 0 0 0 0  Worry too much - different things 0 0 0 0  Trouble relaxing 0 0 0 0  Restless 0 0 0 0  Easily annoyed or irritable 0 0 0 0  Afraid - awful might happen 0 0 0 0  Total GAD 7 Score 0 0 0 0  Anxiety Difficulty Not difficult at all Not difficult at all  Not difficult at all     Past Medical History:  Diagnosis Date   Allergy    Coronary artery disease 2013   a. LHC 2013 w/ severe 3-V CAD; b. s/p 4-V CABG in 2013 (LIMA-LAD, VG-D2, seq VG-PLA/PDA, VG-OM; c.  LHC 2015 patent LIMA-LAD, occluded VGs, med Rx   Diastolic dysfunction    a. 07/2017 Echo: eF 50-55%, mild LVH. no rwma, Gr2 DD. Mildly dil LA/RA. Nl RV size/fxn.   Essential hypertension    Headache(784.0)    Hyperlipidemia LDL goal <70    Left inguinal hernia    Permanent atrial fibrillation (HCC)    Type II diabetes mellitus (HCC)    Past Surgical History:  Procedure Laterality Date   CARDIAC CATHETERIZATION  12/18/13   Occluded SVGs   CARDIOVERSION N/A 07/22/2020   Procedure: CARDIOVERSION;  Surgeon: Sammy Crisp, MD;  Location: [facility] ORS;  Service: Cardiovascular;  Laterality: N/A;   CORONARY ARTERY BYPASS GRAFT  04/28/2012   Procedure: CORONARY ARTERY BYPASS GRAFTING (CABG);  Surgeon: Heriberto London, MD;  [facility];  Service: Open Heart Surgery;  Laterality: N/A;  Right saphenous vein used for bypass grafting. Five  bypasses performed including left mammary artery.    INGUINAL HERNIA REPAIR     right    INGUINAL HERNIA REPAIR Left 01/20/2023   Procedure: LEFT HERNIA REPAIR INGUINAL REPAIR WITH MESH;  Surgeon: Oralee Billow, MD;  Location: [facility];  Service: General;  Laterality: Left;   LEFT HEART CATHETERIZATION WITH CORONARY ANGIOGRAM N/A 04/27/2012   Procedure: LEFT HEART CATHETERIZATION WITH CORONARY ANGIOGRAM;  Surgeon: Darlis Eisenmenger, MD;  Location[facility] C[facility] LAB;  Service: Cardiovascular;  Laterality: N/A;   LEFT HEART CATHETERIZATION WITH CORONARY/GRAFT ANGIOGRAM N/A 12/18/2013   Procedure: LEFT HEART CATHETERIZATION WITH Estella Helling;  Surgeon: Mickiel Albany, MD;  Location: [facility] CATH LAB;  Service: Cardiovascular;  Laterality: N/A;   Social History   Socioeconomic History   Marital status: Married    Spouse name: Not on file   Number of children: Not on file   Years of education: Not on file   Highest education level: Not on file  Occupational History   Not on file  Tobacco Use   Smoking status: Never   Smokeless tobacco: Never   Vaping Use   Vaping status: Never Used  Substance and Sexual Activity   Alcohol use: Not Currently   Drug use: No   Sexual activity: Yes  Other Topics Concern   Not on file  Social History Narrative   Not on file   Social Drivers of Health   Financial Resource Strain: Not on file  Food Insecurity: Not on file  Transportation Needs: Not on file  Physical Activity: Not on file  Stress: Not on file  Social Connections: Not on file  Intimate Partner Violence: Not on file   Family History  Problem Relation  Age of Onset   CAD Mother    Heart attack Mother    CAD Father    Diabetes Father    Heart attack Father    Colon cancer Neg Hx    Prostate cancer Neg Hx    Current Outpatient Medications on File Prior to Visit  Medication Sig   acetaminophen  (TYLENOL ) 325 MG tablet Take 650 mg by mouth every 6 (six) hours as needed for headache.   apixaban  (ELIQUIS ) 5 MG TABS tablet Take 1 tablet (5 mg total) by mouth 2 (two) times daily.   atorvastatin  (LIPITOR ) 80 MG tablet Take 1 tablet (80 mg total) by mouth daily.   Coenzyme Q10 (CO Q 10) 100 MG CAPS Take 200 mg by mouth daily.   fexofenadine (ALLEGRA) 180 MG tablet Take 180 mg by mouth daily.   fish oil-omega-3 fatty acids 1000 MG capsule Take 1 g by mouth daily.   isosorbide  mononitrate (IMDUR ) 30 MG 24 hr tablet Take 1 tablet (30 mg total) by mouth daily.   lisinopril  (ZESTRIL ) 10 MG tablet Take 1 tablet (10 mg total) by mouth daily.   metFORMIN  (GLUCOPHAGE ) 500 MG tablet Take 2 tablets (1,000 mg total) by mouth 2 (two) times daily with a meal.   metoprolol  tartrate (LOPRESSOR ) 25 MG tablet Take 1 tablet (25 mg total) by mouth 2 (two) times daily.   nitroGLYCERIN  (NITROSTAT ) 0.4 MG SL tablet Place 1 tablet (0.4 mg total) under the tongue every 5 (five) minutes as needed for chest pain.   traMADol  (ULTRAM ) 50 MG tablet Take 1-2 tablets (50-100 mg total) by mouth every 6 (six) hours as needed for moderate pain. (Patient not taking:  Reported on 08/10/2023)   No current facility-administered medications on file prior to visit.    Review of Systems  Constitutional:  Negative for activity change, appetite change, chills, diaphoresis, fatigue and fever.  HENT:  Negative for congestion and hearing loss.   Eyes:  Negative for visual disturbance.  Respiratory:  Negative for cough, chest tightness, shortness of breath and wheezing.   Cardiovascular:  Negative for chest pain, palpitations and leg swelling.  Gastrointestinal:  Negative for abdominal pain, constipation, diarrhea, nausea and vomiting.  Genitourinary:  Negative for dysuria, frequency and hematuria.  Musculoskeletal:  Negative for arthralgias and neck pain.  Skin:  Negative for rash.  Neurological:  Negative for dizziness, weakness, light-headedness, numbness and headaches.  Hematological:  Negative for adenopathy.  Psychiatric/Behavioral:  Negative for behavioral problems, dysphoric mood and sleep disturbance.    Per HPI unless specifically indicated above     Objective:    BP 122/68 (BP Location: Left Arm, Patient Position: Sitting, Cuff Size: Normal)   Pulse 64   Ht 6\' 4"  (1.93 m)   Wt 231 lb 2 oz (104.8 kg)   SpO2 98%   BMI 28.13 kg/m   Wt Readings from Last 3 Encounters:  10/17/23 231 lb 2 oz (104.8 kg)  08/10/23 236 lb 2 oz (107.1 kg)  02/09/23 219 lb (99.3 kg)    Physical Exam Vitals and nursing note reviewed.  Constitutional:      General: He is not in acute distress.    Appearance: He is well-developed. He is not diaphoretic.     Comments: Well-appearing, comfortable, cooperative  HENT:     Head: Normocephalic and atraumatic.  Eyes:     General:        Right eye: No discharge.        Left eye: No discharge.  Conjunctiva/sclera: Conjunctivae normal.     Pupils: Pupils are equal, round, and reactive to light.  Neck:     Thyroid : No thyromegaly.     Vascular: No carotid bruit.  Cardiovascular:     Rate and Rhythm: Normal rate and  regular rhythm.     Pulses: Normal pulses.     Heart sounds: Normal heart sounds. No murmur heard. Pulmonary:     Effort: Pulmonary effort is normal. No respiratory distress.     Breath sounds: Normal breath sounds. No wheezing or rales.  Abdominal:     General: Bowel sounds are normal. There is no distension.     Palpations: Abdomen is soft. There is no mass.     Tenderness: There is no abdominal tenderness.  Musculoskeletal:        General: No tenderness. Normal range of motion.     Cervical back: Normal range of motion and neck supple.     Right lower leg: No edema.     Left lower leg: No edema.     Comments: Upper / Lower Extremities: - Normal muscle tone, strength bilateral upper extremities 5/5, lower extremities 5/5  Lymphadenopathy:     Cervical: No cervical adenopathy.  Skin:    General: Skin is warm and dry.     Findings: No erythema or rash.     Comments: Varicose veins R leg larger, some on Left, no ulcerations or erythema  Neurological:     Mental Status: He is alert and oriented to person, place, and time.     Comments: Distal sensation intact to light touch all extremities  Psychiatric:        Mood and Affect: Mood normal.        Behavior: Behavior normal.        Thought Content: Thought content normal.     Comments: Well groomed, good eye contact, normal speech and thoughts     Diabetic Foot Exam - Simple   Simple Foot Form Diabetic Foot exam was performed with the following findings: Yes 10/17/2023  9:39 AM  Visual Inspection No deformities, no ulcerations, no other skin breakdown bilaterally: Yes Sensation Testing Intact to touch and monofilament testing bilaterally: Yes Pulse Check Posterior Tibialis and Dorsalis pulse intact bilaterally: Yes Comments No ulceration or callus. Intact to monofilament. Some varicose veins. No edema      Results for orders placed or performed in visit on 10/10/23  TSH   Collection Time: 10/10/23  8:13 AM  Result Value  Ref Range   TSH 2.70 0.40 - 4.50 mIU/L  PSA   Collection Time: 10/10/23  8:13 AM  Result Value Ref Range   PSA 1.84 < OR = 4.00 ng/mL  CBC with Differential/Platelet   Collection Time: 10/10/23  8:13 AM  Result Value Ref Range   WBC 5.9 3.8 - 10.8 Thousand/uL   RBC 4.57 4.20 - 5.80 Million/uL   Hemoglobin 13.9 13.2 - 17.1 g/dL   HCT 96.0 45.4 - 09.8 %   MCV 92.3 80.0 - 100.0 fL   MCH 30.4 27.0 - 33.0 pg   MCHC 32.9 32.0 - 36.0 g/dL   RDW 11.9 14.7 - 82.9 %   Platelets 201 140 - 400 Thousand/uL   MPV 11.9 7.5 - 12.5 fL   Neutro Abs 3,623 1,500 - 7,800 cells/uL   Absolute Lymphocytes 1,575 850 - 3,900 cells/uL   Absolute Monocytes 472 200 - 950 cells/uL   Eosinophils Absolute 189 15 - 500 cells/uL   Basophils Absolute  41 0 - 200 cells/uL   Neutrophils Relative % 61.4 %   Total Lymphocyte 26.7 %   Monocytes Relative 8.0 %   Eosinophils Relative 3.2 %   Basophils Relative 0.7 %  COMPLETE METABOLIC PANEL WITH GFR   Collection Time: 10/10/23  8:13 AM  Result Value Ref Range   Glucose, Bld 170 (H) 65 - 99 mg/dL   BUN 21 7 - 25 mg/dL   Creat 2.95 6.21 - 3.08 mg/dL   BUN/Creatinine Ratio SEE NOTE: 6 - 22 (calc)   Sodium 139 135 - 146 mmol/L   Potassium 5.1 3.5 - 5.3 mmol/L   Chloride 101 98 - 110 mmol/L   CO2 31 20 - 32 mmol/L   Calcium  10.2 8.6 - 10.3 mg/dL   Total Protein 7.4 6.1 - 8.1 g/dL   Albumin  4.6 3.6 - 5.1 g/dL   Globulin 2.8 1.9 - 3.7 g/dL (calc)   AG Ratio 1.6 1.0 - 2.5 (calc)   Total Bilirubin 0.9 0.2 - 1.2 mg/dL   Alkaline phosphatase (APISO) 78 35 - 144 U/L   AST 19 10 - 35 U/L   ALT 22 9 - 46 U/L  Lipid panel   Collection Time: 10/10/23  8:13 AM  Result Value Ref Range   Cholesterol 94 <200 mg/dL   HDL 38 (L) > OR = 40 mg/dL   Triglycerides 63 <657 mg/dL   LDL Cholesterol (Calc) 42 mg/dL (calc)   Total CHOL/HDL Ratio 2.5 <5.0 (calc)   Non-HDL Cholesterol (Calc) 56 <846 mg/dL (calc)  Hemoglobin N6E   Collection Time: 10/10/23  8:13 AM  Result Value  Ref Range   Hgb A1c MFr Bld 8.0 (H) <5.7 %   Mean Plasma Glucose 183 mg/dL   eAG (mmol/L) 95.2 mmol/L      Assessment & Plan:   Problem List Items Addressed This Visit     Benign hypertension with CKD (chronic kidney disease), stage II   Coronary artery disease of native artery of native heart with stable angina pectoris (HCC)   Hyperlipidemia associated with type 2 diabetes mellitus (HCC)   Type 2 diabetes mellitus with diabetic nephropathy (HCC)   Other Visit Diagnoses       Annual physical exam    -  Primary     Long term current use of oral hypoglycemic drug            Updated Health Maintenance information Reviewed recent lab results with patient Encouraged improvement to lifestyle with diet and exercise Goal of weight loss   Type 2 Diabetes Mellitus Type 2 diabetes with improved glycemic control. A1c reduced to 8.0. Discussed additional medications considering cost and insurance. Emphasized dietary changes. - Continue metformin . - Reduce carbohydrate, starch, and sugar intake. - Provide list of medication options for insurance coverage check.  Hyperlipidemia Hyperlipidemia well-controlled with atorvastatin . LDL at 42. - Continue atorvastatin  80 mg.  Hypertension Controlled on current therapy  Wellness Visit Annual wellness visit completed. No acute concerns. Reviewed dental plans, blood pressure monitoring, and insurance changes. Recent fall with minor hip tenderness noted. - Plan for urine sample urine microalbumin test collection at next visit. - Schedule follow-up appointment in early November   No orders of the defined types were placed in this encounter.   No orders of the defined types were placed in this encounter.    Follow up plan: Return in about 6 months (around 04/18/2024) for 6 month DM A1c.  Domingo Friend, DO [facility] 10/17/2023, 9:22 AM

## 2023-10-17 NOTE — Patient Instructions (Addendum)
 Thank you for coming to the office today.  Recent Labs    02/09/23 0821 10/10/23 0813  HGBA1C 8.1* 8.0*   Overall improved from 8.5, now keep reducing carb starch sugar and diet  Continue Metformin   If interested in adding another medication we can:  Check cost of the following medications  Weekly Injection Mounjaro Ozempic Trulicity  Daily pill Rybelsus Jardiance Farxiga Januvia Tradjenta  Next time will check the Urine Protein results next time.  Please schedule a Follow-up Appointment to: Return in about 6 months (around 04/18/2024) for 6 month DM A1c.  If you have any other questions or concerns, please feel free to call the office or send a message through MyChart. You may also schedule an earlier appointment if necessary.  Additionally, you may be receiving a survey about your experience at our office within a few days to 1 week by e-mail or mail. We value your feedback.  Domingo Friend, DO [facility], [place]

## 2023-10-25 ENCOUNTER — Other Ambulatory Visit: Payer: Self-pay | Admitting: Internal Medicine

## 2023-11-10 DIAGNOSIS — K080 Exfoliation of teeth due to systemic causes: Secondary | ICD-10-CM | POA: Diagnosis not present

## 2023-12-08 DIAGNOSIS — H2513 Age-related nuclear cataract, bilateral: Secondary | ICD-10-CM | POA: Diagnosis not present

## 2023-12-08 DIAGNOSIS — E119 Type 2 diabetes mellitus without complications: Secondary | ICD-10-CM | POA: Diagnosis not present

## 2023-12-08 LAB — HM DIABETES EYE EXAM

## 2023-12-21 ENCOUNTER — Other Ambulatory Visit: Payer: Self-pay | Admitting: Internal Medicine

## 2023-12-27 ENCOUNTER — Other Ambulatory Visit: Payer: Self-pay | Admitting: Family Medicine

## 2023-12-27 DIAGNOSIS — E1121 Type 2 diabetes mellitus with diabetic nephropathy: Secondary | ICD-10-CM

## 2023-12-27 NOTE — Telephone Encounter (Unsigned)
 Copied from CRM [id number]. Topic: Clinical - Medication Refill >> Dec 27, 2023  9:41 AM Gustabo D wrote: Medication:  metFORMIN  (GLUCOPHAGE ) 500 MG tablet   Has the patient contacted their pharmacy? Yes (Agent: If no, request that the patient contact the pharmacy for the refill. If patient does not wish to contact the pharmacy document the reason why and proceed with request.) (Agent: If yes, when and what did the pharmacy advise?)  This is the patient's preferred pharmacy:  [facility] - [place], [place] - [address] [place] [address] Phone: [phone] Fax: [phone]  Is this the correct pharmacy for this prescription? Yes If no, delete pharmacy and type the correct one.   Has the prescription been filled recently? No  Is the patient out of the medication? No almost out  Has the patient been seen for an appointment in the last year OR does the patient have an upcoming appointment? Yes  Can we respond through MyChart? Yes  Agent: Please be advised that Rx refills may take up to 3 business days. We ask that you follow-up with your pharmacy.

## 2023-12-28 MED ORDER — METFORMIN HCL 500 MG PO TABS
1000.0000 mg | ORAL_TABLET | Freq: Two times a day (BID) | ORAL | 0 refills | Status: DC
Start: 2023-12-28 — End: 2024-04-10

## 2023-12-28 NOTE — Telephone Encounter (Signed)
 OV 10/17/23- continue per notes Requested Prescriptions  Pending Prescriptions Disp Refills   metFORMIN  (GLUCOPHAGE ) 500 MG tablet 360 tablet 0    Sig: Take 2 tablets (1,000 mg total) by mouth 2 (two) times daily with a meal.     Endocrinology:  Diabetes - Biguanides Failed - 12/28/2023  2:05 PM      Failed - HBA1C is between 0 and 7.9 and within 180 days    Hgb A1c MFr Bld  Date Value Ref Range Status  10/10/2023 8.0 (H) <5.7 % Final    Comment:    For someone without known diabetes, a hemoglobin A1c value of 6.5% or greater indicates that they may have  diabetes and this should be confirmed with a follow-up  test. . For someone with known diabetes, a value <7% indicates  that their diabetes is well controlled and a value  greater than or equal to 7% indicates suboptimal  control. A1c targets should be individualized based on  duration of diabetes, age, comorbid conditions, and  other considerations. . Currently, no consensus exists regarding use of hemoglobin A1c for diagnosis of diabetes for children. .          Failed - eGFR in normal range and within 360 days    GFR, Est African American  Date Value Ref Range Status  09/11/2020 75 > OR = 60 mL/min/1.41m2 Final   GFR, Est Non African American  Date Value Ref Range Status  09/11/2020 64 > OR = 60 mL/min/1.52m2 Final   GFR, Estimated  Date Value Ref Range Status  08/04/2021 >60 >60 mL/min Final    Comment:    (NOTE) Calculated using the CKD-EPI Creatinine Equation (2021)    GFR  Date Value Ref Range Status  06/11/2014 64.45 >60.00 mL/min Final   eGFR  Date Value Ref Range Status  09/20/2022 66 > OR = 60 mL/min/1.34m2 Final         Failed - B12 Level in normal range and within 720 days    No results found for: VITAMINB12       Passed - Cr in normal range and within 360 days    Creat  Date Value Ref Range Status  10/10/2023 1.26 0.70 - 1.28 mg/dL Final   Creatinine, Urine  Date Value Ref Range Status   02/09/2023 159 20 - 320 mg/dL Final         Passed - Valid encounter within last 6 months    Recent Outpatient Visits           2 months ago Annual physical exam   [place] [facility] [place], Marsa PARAS, DO              Passed - CBC within normal limits and completed in the last 12 months    WBC  Date Value Ref Range Status  10/10/2023 5.9 3.8 - 10.8 Thousand/uL Final   RBC  Date Value Ref Range Status  10/10/2023 4.57 4.20 - 5.80 Million/uL Final   Hemoglobin  Date Value Ref Range Status  10/10/2023 13.9 13.2 - 17.1 g/dL Final  98/79/7977 85.3 13.0 - 17.7 g/dL Final   HCT  Date Value Ref Range Status  10/10/2023 42.2 38.5 - 50.0 % Final   Hematocrit  Date Value Ref Range Status  07/03/2020 43.3 37.5 - 51.0 % Final   MCHC  Date Value Ref Range Status  10/10/2023 32.9 32.0 - 36.0 g/dL Final    Comment:    For adults,  a slight decrease in the calculated MCHC value (in the range of 30 to 32 g/dL) is most likely not clinically significant; however, it should be interpreted with caution in correlation with other red cell parameters and the patient's clinical condition.    [facility]  Date Value Ref Range Status  10/10/2023 30.4 27.0 - 33.0 pg Final   MCV  Date Value Ref Range Status  10/10/2023 92.3 80.0 - 100.0 fL Final  07/03/2020 90 79 - 97 fL Final   No results found for: PLTCOUNTKUC, LABPLAT, POCPLA RDW  Date Value Ref Range Status  10/10/2023 12.5 11.0 - 15.0 % Final  07/03/2020 12.4 11.6 - 15.4 % Final

## 2024-01-23 ENCOUNTER — Other Ambulatory Visit: Payer: Self-pay | Admitting: Internal Medicine

## 2024-03-12 NOTE — Progress Notes (Signed)
 HERO KULISH                                          MRN: [id number]   03/12/2024   The [facility]BCI Quality Team Specialist reviewed this patient medical record for the purposes of chart review for care gap closure. The following were reviewed: chart review for care gap closure-kidney health evaluation for diabetes:eGFR  and uACR.    VBCI Quality Team

## 2024-04-09 ENCOUNTER — Other Ambulatory Visit: Payer: Self-pay | Admitting: Family Medicine

## 2024-04-09 DIAGNOSIS — E1121 Type 2 diabetes mellitus with diabetic nephropathy: Secondary | ICD-10-CM

## 2024-04-10 NOTE — Telephone Encounter (Signed)
 Requested Prescriptions  Pending Prescriptions Disp Refills   metFORMIN  (GLUCOPHAGE ) 500 MG tablet [Pharmacy Med Name: METFORMIN  HCL 500 MG TAB] 360 tablet 0    Sig: TAKE 2 TABLETS BY MOUTH TWICE DAILY. TAKE WITH A MEAL     Endocrinology:  Diabetes - Biguanides Failed - 04/10/2024  2:19 PM      Failed - HBA1C is between 0 and 7.9 and within 180 days    Hgb A1c MFr Bld  Date Value Ref Range Status  10/10/2023 8.0 (H) <5.7 % Final    Comment:    For someone without known diabetes, a hemoglobin A1c value of 6.5% or greater indicates that they may have  diabetes and this should be confirmed with a follow-up  test. . For someone with known diabetes, a value <7% indicates  that their diabetes is well controlled and a value  greater than or equal to 7% indicates suboptimal  control. A1c targets should be individualized based on  duration of diabetes, age, comorbid conditions, and  other considerations. . Currently, no consensus exists regarding use of hemoglobin A1c for diagnosis of diabetes for children. .          Failed - eGFR in normal range and within 360 days    GFR, Est African American  Date Value Ref Range Status  09/11/2020 75 > OR = 60 mL/min/1.62m2 Final   GFR, Est Non African American  Date Value Ref Range Status  09/11/2020 64 > OR = 60 mL/min/1.75m2 Final   GFR, Estimated  Date Value Ref Range Status  08/04/2021 >60 >60 mL/min Final    Comment:    (NOTE) Calculated using the CKD-EPI Creatinine Equation (2021)    GFR  Date Value Ref Range Status  06/11/2014 64.45 >60.00 mL/min Final   eGFR  Date Value Ref Range Status  09/20/2022 66 > OR = 60 mL/min/1.55m2 Final         Failed - B12 Level in normal range and within 720 days    No results found for: VITAMINB12       Passed - Cr in normal range and within 360 days    Creat  Date Value Ref Range Status  10/10/2023 1.26 0.70 - 1.28 mg/dL Final   Creatinine, Urine  Date Value Ref Range Status   02/09/2023 159 20 - 320 mg/dL Final         Passed - Valid encounter within last 6 months    Recent Outpatient Visits           5 months ago Annual physical exam   [facility], Marsa PARAS, DO              Passed - CBC within normal limits and completed in the last 12 months    WBC  Date Value Ref Range Status  10/10/2023 5.9 3.8 - 10.8 Thousand/uL Final   RBC  Date Value Ref Range Status  10/10/2023 4.57 4.20 - 5.80 Million/uL Final   Hemoglobin  Date Value Ref Range Status  10/10/2023 13.9 13.2 - 17.1 g/dL Final  98/79/7977 85.3 13.0 - 17.7 g/dL Final   HCT  Date Value Ref Range Status  10/10/2023 42.2 38.5 - 50.0 % Final   Hematocrit  Date Value Ref Range Status  07/03/2020 43.3 37.5 - 51.0 % Final   MCHC  Date Value Ref Range Status  10/10/2023 32.9 32.0 - 36.0 g/dL Final    Comment:    For adults, a  slight decrease in the calculated MCHC value (in the range of 30 to 32 g/dL) is most likely not clinically significant; however, it should be interpreted with caution in correlation with other red cell parameters and the patient's clinical condition.    [facility]  Date Value Ref Range Status  10/10/2023 30.4 27.0 - 33.0 pg Final   MCV  Date Value Ref Range Status  10/10/2023 92.3 80.0 - 100.0 fL Final  07/03/2020 90 79 - 97 fL Final   No results found for: PLTCOUNTKUC, LABPLAT, POCPLA RDW  Date Value Ref Range Status  10/10/2023 12.5 11.0 - 15.0 % Final  07/03/2020 12.4 11.6 - 15.4 % Final

## 2024-04-23 ENCOUNTER — Ambulatory Visit: Admitting: Family Medicine

## 2024-04-23 ENCOUNTER — Other Ambulatory Visit: Payer: Self-pay | Admitting: Family Medicine

## 2024-04-23 ENCOUNTER — Encounter: Payer: Self-pay | Admitting: Family Medicine

## 2024-04-23 VITALS — BP 122/70 | HR 77 | Ht 76.0 in | Wt 234.2 lb

## 2024-04-23 DIAGNOSIS — Z7984 Long term (current) use of oral hypoglycemic drugs: Secondary | ICD-10-CM

## 2024-04-23 DIAGNOSIS — I4819 Other persistent atrial fibrillation: Secondary | ICD-10-CM

## 2024-04-23 DIAGNOSIS — E1121 Type 2 diabetes mellitus with diabetic nephropathy: Secondary | ICD-10-CM

## 2024-04-23 DIAGNOSIS — Z23 Encounter for immunization: Secondary | ICD-10-CM

## 2024-04-23 DIAGNOSIS — I129 Hypertensive chronic kidney disease with stage 1 through stage 4 chronic kidney disease, or unspecified chronic kidney disease: Secondary | ICD-10-CM

## 2024-04-23 DIAGNOSIS — I25118 Atherosclerotic heart disease of native coronary artery with other forms of angina pectoris: Secondary | ICD-10-CM

## 2024-04-23 DIAGNOSIS — R351 Nocturia: Secondary | ICD-10-CM

## 2024-04-23 DIAGNOSIS — E1169 Type 2 diabetes mellitus with other specified complication: Secondary | ICD-10-CM | POA: Diagnosis not present

## 2024-04-23 DIAGNOSIS — Z0000 Encounter for general adult medical examination without abnormal findings: Secondary | ICD-10-CM

## 2024-04-23 DIAGNOSIS — N182 Chronic kidney disease, stage 2 (mild): Secondary | ICD-10-CM

## 2024-04-23 DIAGNOSIS — E785 Hyperlipidemia, unspecified: Secondary | ICD-10-CM

## 2024-04-23 LAB — POCT GLYCOSYLATED HEMOGLOBIN (HGB A1C): Hemoglobin A1C: 7.3 % — AB (ref 4.0–5.6)

## 2024-04-23 MED ORDER — METFORMIN HCL 500 MG PO TABS: 1000.0000 mg | ORAL_TABLET | Freq: Two times a day (BID) | ORAL | 3 refills | Status: AC

## 2024-04-23 NOTE — Patient Instructions (Addendum)
 Thank you for coming to the office today.  Recent Labs    10/10/23 0813 04/23/24 0906  HGBA1C 8.0* 7.3*   Flu Shot + Prevnar-20 pneumonia vaccine  DUE for FASTING BLOOD WORK (no food or drink after midnight before the lab appointment, only water or coffee without cream/sugar on the morning of)  SCHEDULE Lab Only visit in the morning at the clinic for lab draw in 6 MONTHS   - Make sure Lab Only appointment is at about 1 week before your next appointment, so that results will be available  For Lab Results, once available within 2-3 days of blood draw, you can can log in to MyChart online to view your results and a brief explanation. Also, we can discuss results at next follow-up visit.   Please schedule a Follow-up Appointment to: Return for 6 month fasting lab > 1 week later Annual Physical.  If you have any other questions or concerns, please feel free to call the office or send a message through MyChart. You may also schedule an earlier appointment if necessary.  Additionally, you may be receiving a survey about your experience at our office within a few days to 1 week by e-mail or mail. We value your feedback.  Marsa Officer, DO [facility], [place]

## 2024-04-23 NOTE — Progress Notes (Signed)
 Subjective:    Patient ID: [name] [name], male    DOB: [birth year], 73 y.o.   MRN: [id number]  [name] [name] is a 73 y.o. male presenting on 04/23/2024 for Diabetes   HPI  Discussed the use of AI scribe software for clinical note transcription with the patient, who gave verbal consent to proceed.  History of Present Illness   [name] [name] is a 73 year old male with diabetes who presents for routine follow-up and management of his blood sugar levels.  Skin Abrasion / Laceration - Recent injury R forearm scratched on a dog fence, resulting in bleeding. He is on anticoagulation - Wound managed with topical ointment - Up to date on tetanus vaccination, 2017, not due yet.   Type 2 Diabetes, with Nephropathy CKD II Overweight BMI >28 See prior notes for background information.  - Diabetes mellitus with generally well-controlled blood glucose levels - Recent spike in blood sugar after consuming fudge during Halloween, managed by drinking water - Hemoglobin A1c improved to 7.3 from previous readings of 8.5, 8.1, and 8.0 CBGs: 150-170 avg Meds: Metformin  1000mg  BID - tolerating well without side effects Currently on ACEi Lifestyle: - Exercise (improved exercise outdoors, yard work he is tolerating exertion even better, stationary bike) - Father with diabetes Recently updated DM Eye Exam. Dr Mevelyn Denies hypoglycemia, visual changes, numbness or tingling, polyuria    CHRONIC HTN: Follows with Cardiology He still has chronic bradycardia. Current Meds - Lisinopril  10mg  daily, Imdur  30mg  daily, Metoprolol  50mg  BID Reports good compliance, took meds today. Tolerating well, w/o complaints. Denies CP, dyspnea, HA, edema, dizziness / lightheadedness   Osteoarthritis multiple joints Reports has episodic days of stiffness and soreness hips and other joints. - Arthritis pain, particularly at night, onset approximately one month ago - Initially managed with Tylenol , three to four  doses, then switched to regular Tylenol  as needed - Significant improvement in pain since onset   Persistent Atrial Fibrillation On Anticoagulation Eliquis  Followed by Cardiology   HYPERLIPIDEMIA: - Reports no concerns. Last lipid panel 09/2023, LDL 42 - Currently taking Atorvastatin  80mg , tolerating well without side effects or myalgias Followed by Cardiology      Health Maintenance:  Flu Shot + Prevnar-20 today  Updated TDap already   Last Colonoscopy 12/23/22 - [facility] Dr Donnald, 2 polyps   PSA 1.84 09/2023, prior 1.92     04/23/2024    9:38 AM 10/17/2023    8:50 AM 11/25/2022    8:12 AM  Depression screen PHQ 2/9  Decreased Interest 0 0 0  Down, Depressed, Hopeless 0 0 1  PHQ - 2 Score 0 0 1  Altered sleeping   0  Tired, decreased energy   1  Change in appetite   0  Feeling bad or failure about yourself    0  Trouble concentrating   0  Moving slowly or fidgety/restless   0  Suicidal thoughts   0  PHQ-9 Score   2   Difficult doing work/chores   Not difficult at all     Data saved with a previous flowsheet row definition       10/17/2023    8:50 AM 11/25/2022    8:14 AM 09/27/2022    9:00 AM 01/25/2022    9:50 AM  GAD 7 : Generalized Anxiety Score  Nervous, Anxious, on Edge 0 0 0 0  Control/stop worrying 0 0 0 0  Worry too much - different things 0 0 0 0  Trouble relaxing 0 0 0 0  Restless 0 0 0 0  Easily annoyed or irritable 0 0 0 0  Afraid - awful might happen 0 0 0 0  Total GAD 7 Score 0 0 0 0  Anxiety Difficulty Not difficult at all Not difficult at all  Not difficult at all    Social History   Tobacco Use   Smoking status: Never   Smokeless tobacco: Never  Vaping Use   Vaping status: Never Used  Substance Use Topics   Alcohol use: Not Currently   Drug use: No    Review of Systems Per HPI unless specifically indicated above     Objective:    BP 122/70 (BP Location: Left Arm, Patient Position: Sitting, Cuff Size: Normal)   Pulse 77   Ht  6' 4 (1.93 m)   Wt 234 lb 4 oz (106.3 kg)   SpO2 99%   BMI 28.51 kg/m   Wt Readings from Last 3 Encounters:  04/23/24 234 lb 4 oz (106.3 kg)  10/17/23 231 lb 2 oz (104.8 kg)  08/10/23 236 lb 2 oz (107.1 kg)    Physical Exam Vitals and nursing note reviewed.  Constitutional:      General: He is not in acute distress.    Appearance: He is well-developed. He is not diaphoretic.     Comments: Well-appearing, comfortable, cooperative  HENT:     Head: Normocephalic and atraumatic.  Eyes:     General:        Right eye: No discharge.        Left eye: No discharge.     Conjunctiva/sclera: Conjunctivae normal.  Neck:     Thyroid : No thyromegaly.  Cardiovascular:     Rate and Rhythm: Normal rate and regular rhythm.     Pulses: Normal pulses.     Heart sounds: Normal heart sounds. No murmur heard. Pulmonary:     Effort: Pulmonary effort is normal. No respiratory distress.     Breath sounds: Normal breath sounds. No wheezing or rales.  Musculoskeletal:        General: Normal range of motion.     Cervical back: Normal range of motion and neck supple.  Lymphadenopathy:     Cervical: No cervical adenopathy.  Skin:    General: Skin is warm and dry.     Findings: No erythema or rash.  Neurological:     Mental Status: He is alert and oriented to person, place, and time. Mental status is at baseline.  Psychiatric:        Behavior: Behavior normal.     Comments: Well groomed, good eye contact, normal speech and thoughts     Recent Labs    10/10/23 0813 04/23/24 0906  HGBA1C 8.0* 7.3*    Results for orders placed or performed in visit on 04/23/24  POCT HgB A1C   Collection Time: 04/23/24  9:06 AM  Result Value Ref Range   Hemoglobin A1C 7.3 (A) 4.0 - 5.6 %   HbA1c POC (<> result, manual entry)     HbA1c, POC (prediabetic range)     HbA1c, POC (controlled diabetic range)        Assessment & Plan:   Problem List Items Addressed This Visit     Benign hypertension with CKD  (chronic kidney disease), stage II   Coronary artery disease of native artery of native heart with stable angina pectoris   Hyperlipidemia associated with type 2 diabetes mellitus (HCC)   Relevant Medications  metFORMIN  (GLUCOPHAGE ) 500 MG tablet   Persistent atrial fibrillation (HCC)   Type 2 diabetes mellitus with diabetic nephropathy (HCC) - Primary   Relevant Medications   metFORMIN  (GLUCOPHAGE ) 500 MG tablet   Other Relevant Orders   POCT HgB A1C (Completed)   Urine Microalbumin w/creat. ratio   Other Visit Diagnoses       Long term current use of oral hypoglycemic drug         Need for Streptococcus pneumoniae vaccination       Relevant Orders   Pneumococcal conjugate vaccine 20-valent (Completed)     Needs flu shot       Relevant Orders   Flu vaccine HIGH DOSE PF(Fluzone Trivalent) (Completed)        Type 2 diabetes mellitus with nephropathy and other specified complication Complication with Hyperlipidemia, CAD/PAD Blood sugar levels controlled with HbA1c at 7.3%. Metformin  effective. - Continue metformin , 500mg  IR two pills twice a day. - Ordered urine test to monitor kidney function, urine microalbumin - Updated DM Eye  Hypertensive chronic kidney disease, stage 2 eGFR >60 Blood pressure controlled at 122/70 mmHg. Controlled on Lisinopril  10mg , Metoprolol  25mg  TWICE A DAY, Isosorbide  30mg   Osteoarthritis / Arthralgia (joint pain) Intermittent joint pain managed with Tylenol . - Continue Tylenol  as needed for joint pain.  Persistent Atrial Fibrillation On anticoagulation Eliquis  Followed by Cardiology  R Forearm laceration Healing, keep using topical antibiotic ointment No sign of secondary infection today  Hyperlipidemia On Statin therapy Atorvastatin   General Health Maintenance Discussed and administered flu and pneumonia vaccines. - Administered flu and pneumonia vaccines prevnar-20 today - Advised on post-vaccination care: use ice and Tylenol  for  discomfort.       Orders Placed This Encounter  Procedures   Pneumococcal conjugate vaccine 20-valent   Flu vaccine HIGH DOSE PF(Fluzone Trivalent)   Urine Microalbumin w/creat. ratio   POCT HgB A1C    Meds ordered this encounter  Medications   metFORMIN  (GLUCOPHAGE ) 500 MG tablet    Sig: Take 2 tablets (1,000 mg total) by mouth 2 (two) times daily with a meal.    Dispense:  360 tablet    Refill:  3    Add future refills when ready, patient just ordered on 04/10/24    Follow up plan: Return for 6 month fasting lab > 1 week later Annual Physical.  Future labs ordered for 10/2024  Marsa Officer, DO [facility] 04/23/2024, 9:04 AM

## 2024-04-24 ENCOUNTER — Ambulatory Visit: Payer: Self-pay | Admitting: Family Medicine

## 2024-04-24 LAB — MICROALBUMIN / CREATININE URINE RATIO
Creatinine, Urine: 87 mg/dL (ref 20–320)
Microalb Creat Ratio: 10 mg/g{creat} (ref <30)
Microalb, Ur: 0.9 mg/dL

## 2024-04-27 ENCOUNTER — Other Ambulatory Visit: Payer: Self-pay | Admitting: Internal Medicine

## 2024-05-04 NOTE — Progress Notes (Signed)
 [place]ABAN [place]                                          MRN: [id number]   05/04/2024   The [facility] Quality Team Specialist reviewed this patient medical record for the purposes of chart review for care gap closure. The following were reviewed: chart review for care gap closure-kidney health evaluation for diabetes:eGFR  and uACR.    [facility] Quality Team

## 2024-07-05 ENCOUNTER — Other Ambulatory Visit: Payer: Self-pay | Admitting: Internal Medicine

## 2024-07-05 DIAGNOSIS — I4819 Other persistent atrial fibrillation: Secondary | ICD-10-CM

## 2024-07-11 ENCOUNTER — Ambulatory Visit: Admitting: Internal Medicine

## 2024-09-19 ENCOUNTER — Ambulatory Visit: Admitting: Internal Medicine

## 2024-10-22 ENCOUNTER — Other Ambulatory Visit

## 2024-10-29 ENCOUNTER — Encounter: Admitting: Family Medicine
